# Patient Record
Sex: Male | Born: 1955
Health system: Southern US, Community
[De-identification: ages and names within clinical notes are randomized; demographics above are authoritative.]

## PROBLEM LIST (undated history)

## (undated) DIAGNOSIS — M25569 Pain in unspecified knee: Secondary | ICD-10-CM

## (undated) DIAGNOSIS — F32A Depression, unspecified: Secondary | ICD-10-CM

## (undated) DIAGNOSIS — M1990 Unspecified osteoarthritis, unspecified site: Secondary | ICD-10-CM

## (undated) DIAGNOSIS — I10 Essential (primary) hypertension: Secondary | ICD-10-CM

## (undated) DIAGNOSIS — E785 Hyperlipidemia, unspecified: Secondary | ICD-10-CM

## (undated) DIAGNOSIS — E669 Obesity, unspecified: Secondary | ICD-10-CM

## (undated) DIAGNOSIS — E119 Type 2 diabetes mellitus without complications: Secondary | ICD-10-CM

## (undated) DIAGNOSIS — R600 Localized edema: Secondary | ICD-10-CM

## (undated) DIAGNOSIS — F329 Major depressive disorder, single episode, unspecified: Secondary | ICD-10-CM

## (undated) DIAGNOSIS — G8929 Other chronic pain: Secondary | ICD-10-CM

## (undated) HISTORY — DX: Unspecified osteoarthritis, unspecified site: M19.90

## (undated) HISTORY — DX: Type 2 diabetes mellitus without complications: E11.9

## (undated) HISTORY — DX: Depression, unspecified: F32.A

## (undated) HISTORY — DX: Localized edema: R60.0

## (undated) HISTORY — PX: OTHER SURGICAL HISTORY: SHX169

## (undated) HISTORY — DX: Obesity, unspecified: E66.9

## (undated) HISTORY — DX: Hyperlipidemia, unspecified: E78.5

## (undated) HISTORY — DX: Essential (primary) hypertension: I10

## (undated) HISTORY — DX: Other chronic pain: G89.29

## (undated) HISTORY — DX: Pain in unspecified knee: M25.569

## (undated) HISTORY — DX: Major depressive disorder, single episode, unspecified: F32.9

## (undated) SURGERY — COLONOSCOPY
Anesthesia: Moderate Sedation

---

## 2001-08-12 ENCOUNTER — Emergency Department (HOSPITAL_COMMUNITY): Admission: EM | Admit: 2001-08-12 | Discharge: 2001-08-12 | Payer: Self-pay | Admitting: *Deleted

## 2001-08-12 ENCOUNTER — Encounter: Payer: Self-pay | Admitting: *Deleted

## 2004-05-25 ENCOUNTER — Emergency Department (HOSPITAL_COMMUNITY): Admission: EM | Admit: 2004-05-25 | Discharge: 2004-05-25 | Payer: Self-pay | Admitting: Emergency Medicine

## 2005-03-28 ENCOUNTER — Emergency Department (HOSPITAL_COMMUNITY): Admission: EM | Admit: 2005-03-28 | Discharge: 2005-03-28 | Payer: Self-pay | Admitting: Emergency Medicine

## 2007-03-20 ENCOUNTER — Encounter (INDEPENDENT_AMBULATORY_CARE_PROVIDER_SITE_OTHER): Payer: Self-pay | Admitting: Family Medicine

## 2009-01-25 ENCOUNTER — Emergency Department (HOSPITAL_COMMUNITY): Admission: EM | Admit: 2009-01-25 | Discharge: 2009-01-25 | Payer: Self-pay | Admitting: Emergency Medicine

## 2010-11-11 NOTE — Letter (Signed)
Summary: no show  no show   Imported By: Alden Server 03/22/2007 16:21:51  _____________________________________________________________________  External Attachment:    Type:   Image     Comment:   External Document

## 2011-12-08 ENCOUNTER — Ambulatory Visit (INDEPENDENT_AMBULATORY_CARE_PROVIDER_SITE_OTHER): Payer: Medicare Other | Admitting: Family Medicine

## 2011-12-08 ENCOUNTER — Other Ambulatory Visit: Payer: Self-pay | Admitting: Family Medicine

## 2011-12-08 ENCOUNTER — Encounter: Payer: Self-pay | Admitting: Family Medicine

## 2011-12-08 VITALS — BP 152/70 | HR 96 | Resp 18 | Ht 69.0 in | Wt 265.1 lb

## 2011-12-08 DIAGNOSIS — I10 Essential (primary) hypertension: Secondary | ICD-10-CM

## 2011-12-08 DIAGNOSIS — E785 Hyperlipidemia, unspecified: Secondary | ICD-10-CM

## 2011-12-08 DIAGNOSIS — E669 Obesity, unspecified: Secondary | ICD-10-CM

## 2011-12-08 DIAGNOSIS — R600 Localized edema: Secondary | ICD-10-CM

## 2011-12-08 DIAGNOSIS — M25569 Pain in unspecified knee: Secondary | ICD-10-CM

## 2011-12-08 DIAGNOSIS — R609 Edema, unspecified: Secondary | ICD-10-CM

## 2011-12-08 DIAGNOSIS — M25561 Pain in right knee: Secondary | ICD-10-CM

## 2011-12-08 MED ORDER — FUROSEMIDE 20 MG PO TABS: 20.0000 mg | ORAL_TABLET | Freq: Every day | ORAL | Status: DC

## 2011-12-08 MED ORDER — TRAMADOL HCL 50 MG PO TABS: 50.0000 mg | ORAL_TABLET | Freq: Three times a day (TID) | ORAL | Status: DC | PRN

## 2011-12-08 NOTE — Patient Instructions (Signed)
Start the water pill Lasix for your blood pressure and your leg swelling Get your labs done, do not eat after midnight - we will discuss the labs at your next visit Get the x-rays of your knees Use the pain pill three times a day as needed F/U 2 weeks

## 2011-12-10 LAB — COMPREHENSIVE METABOLIC PANEL
ALT: 53 U/L (ref 0–53)
AST: 49 U/L — ABNORMAL HIGH (ref 0–37)
Albumin: 4 g/dL (ref 3.5–5.2)
Alkaline Phosphatase: 70 U/L (ref 39–117)
BUN: 13 mg/dL (ref 6–23)
CO2: 21 mEq/L (ref 19–32)
Calcium: 8.7 mg/dL (ref 8.4–10.5)
Chloride: 103 mEq/L (ref 96–112)
Creat: 0.87 mg/dL (ref 0.50–1.35)
Glucose, Bld: 127 mg/dL — ABNORMAL HIGH (ref 70–99)
Potassium: 4.3 mEq/L (ref 3.5–5.3)
Sodium: 138 mEq/L (ref 135–145)
Total Bilirubin: 0.4 mg/dL (ref 0.3–1.2)
Total Protein: 7 g/dL (ref 6.0–8.3)

## 2011-12-10 LAB — LIPID PANEL
Cholesterol: 189 mg/dL (ref 0–200)
HDL: 37 mg/dL — ABNORMAL LOW (ref >39)
LDL Cholesterol: 130 mg/dL — ABNORMAL HIGH (ref 0–99)
Total CHOL/HDL Ratio: 5.1 Ratio
Triglycerides: 110 mg/dL (ref <150)
VLDL: 22 mg/dL (ref 0–40)

## 2011-12-10 LAB — CBC
HCT: 51 % (ref 39.0–52.0)
Hemoglobin: 17.1 g/dL — ABNORMAL HIGH (ref 13.0–17.0)
MCH: 35.3 pg — ABNORMAL HIGH (ref 26.0–34.0)
MCHC: 33.5 g/dL (ref 30.0–36.0)
MCV: 105.2 fL — ABNORMAL HIGH (ref 78.0–100.0)
Platelets: 203 10*3/uL (ref 150–400)
RBC: 4.85 MIL/uL (ref 4.22–5.81)
RDW: 12.3 % (ref 11.5–15.5)
WBC: 10.5 10*3/uL (ref 4.0–10.5)

## 2011-12-10 LAB — TSH: TSH: 2.137 u[IU]/mL (ref 0.350–4.500)

## 2011-12-11 ENCOUNTER — Encounter: Payer: Self-pay | Admitting: Family Medicine

## 2011-12-11 DIAGNOSIS — E669 Obesity, unspecified: Secondary | ICD-10-CM | POA: Insufficient documentation

## 2011-12-11 DIAGNOSIS — M25561 Pain in right knee: Secondary | ICD-10-CM | POA: Insufficient documentation

## 2011-12-11 DIAGNOSIS — R600 Localized edema: Secondary | ICD-10-CM | POA: Insufficient documentation
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
## 2011-12-11 NOTE — Assessment & Plan Note (Signed)
He does have small soft tissue prominence of right knee. Obtain plain films, start ultram prn pain.

## 2011-12-11 NOTE — Assessment & Plan Note (Signed)
With his edema and elevated BP, will give trial of lasix, obtain labs

## 2011-12-11 NOTE — Assessment & Plan Note (Signed)
·   Obtain FLP

## 2011-12-11 NOTE — Assessment & Plan Note (Signed)
Lasix trial for symptom relief

## 2011-12-12 ENCOUNTER — Ambulatory Visit (HOSPITAL_COMMUNITY)
Admission: RE | Admit: 2011-12-12 | Discharge: 2011-12-12 | Disposition: A | Discharge status: Home / Self Care | Payer: PRIVATE HEALTH INSURANCE | Source: Ambulatory Visit | Attending: Family Medicine | Admitting: Family Medicine

## 2011-12-12 ENCOUNTER — Other Ambulatory Visit: Payer: Self-pay | Admitting: Family Medicine

## 2011-12-12 DIAGNOSIS — M25561 Pain in right knee: Secondary | ICD-10-CM

## 2011-12-12 DIAGNOSIS — M25562 Pain in left knee: Secondary | ICD-10-CM

## 2011-12-12 DIAGNOSIS — M25569 Pain in unspecified knee: Secondary | ICD-10-CM | POA: Insufficient documentation

## 2011-12-13 LAB — HEMOGLOBIN A1C
Hgb A1c MFr Bld: 6.5 % — ABNORMAL HIGH (ref <5.7)
Mean Plasma Glucose: 140 mg/dL — ABNORMAL HIGH (ref <117)

## 2011-12-22 ENCOUNTER — Encounter: Payer: Self-pay | Admitting: Family Medicine

## 2011-12-22 ENCOUNTER — Ambulatory Visit (INDEPENDENT_AMBULATORY_CARE_PROVIDER_SITE_OTHER): Payer: Medicare Other | Admitting: Family Medicine

## 2011-12-22 DIAGNOSIS — M25561 Pain in right knee: Secondary | ICD-10-CM

## 2011-12-22 DIAGNOSIS — G589 Mononeuropathy, unspecified: Secondary | ICD-10-CM

## 2011-12-22 DIAGNOSIS — L608 Other nail disorders: Secondary | ICD-10-CM

## 2011-12-22 DIAGNOSIS — I10 Essential (primary) hypertension: Secondary | ICD-10-CM

## 2011-12-22 DIAGNOSIS — K529 Noninfective gastroenteritis and colitis, unspecified: Secondary | ICD-10-CM

## 2011-12-22 DIAGNOSIS — E785 Hyperlipidemia, unspecified: Secondary | ICD-10-CM

## 2011-12-22 DIAGNOSIS — M25569 Pain in unspecified knee: Secondary | ICD-10-CM

## 2011-12-22 DIAGNOSIS — M79606 Pain in leg, unspecified: Secondary | ICD-10-CM

## 2011-12-22 DIAGNOSIS — E119 Type 2 diabetes mellitus without complications: Secondary | ICD-10-CM

## 2011-12-22 DIAGNOSIS — G629 Polyneuropathy, unspecified: Secondary | ICD-10-CM

## 2011-12-22 DIAGNOSIS — M79609 Pain in unspecified limb: Secondary | ICD-10-CM

## 2011-12-22 DIAGNOSIS — M25562 Pain in left knee: Secondary | ICD-10-CM

## 2011-12-22 DIAGNOSIS — L602 Onychogryphosis: Secondary | ICD-10-CM

## 2011-12-22 DIAGNOSIS — K5289 Other specified noninfective gastroenteritis and colitis: Secondary | ICD-10-CM

## 2011-12-22 MED ORDER — LISINOPRIL 10 MG PO TABS: 10.0000 mg | ORAL_TABLET | Freq: Every day | ORAL | Status: DC

## 2011-12-22 MED ORDER — TRAMADOL HCL 50 MG PO TABS: ORAL_TABLET | ORAL | Status: DC

## 2011-12-22 MED ORDER — METFORMIN HCL ER 500 MG PO TB24: 500.0000 mg | ORAL_TABLET | Freq: Every day | ORAL | Status: DC

## 2011-12-22 NOTE — Progress Notes (Signed)
  Subjective:    Patient ID: CARVER MURAKAMI, male    DOB: June 25, 1956, 56 y.o.   MRN: [id number]  HPI Patient here to followup last visit with labs and imaging. Knee pain-persistent knee pain with locking and giving out. His x-rays showed tricompartmental disease. He is using Ultram which does help.  Diabetes mellitus-A1c 6.5%. Hyperlipidemia-LDL 130  Stomach bug-patient states he's had as noted above for the past week. With nausea and vomiting depending on what foods he eats such as boiled aches. He is able to tolerate liquids broth soups and bland foods. He also admits to diarrhea which is nonbloody. His brother is also sick with the same thing.  Leg swelling- legs have improved with use of lasix,  Foot pain-he has pain in his feet with walking, unable to walk long distances, also has numbness in his feet  Review of Systems   GEN- denies fatigue, fever, weight loss,weakness, recent illness HEENT- denies eye drainage, change in vision, nasal discharge, CVS- denies chest pain, palpitations RESP- denies SOB, cough, wheeze ABD- + N/V,+ change in stools, abd pain GU- denies dysuria, hematuria, dribbling, incontinence MSK- + joint pain, muscle aches, injury Neuro- denies headache, dizziness, syncope, seizure activity      Objective:   Physical Exam  GEN- NAD, alert and oriented x3, obese,walks with cane HEENT- PERRL, EOMI, non injected sclera, pink conjunctiva, slightly dry MM oropharynx clear CVS- RRR, no murmur RESP-CTAB abd- NABS, soft, NT, ND, obese, EXT- Trace edema bilaterallly lower ext Pulses- Radial, DP- 2+       Assessment & Plan:

## 2011-12-22 NOTE — Patient Instructions (Addendum)
I send to the foot the doctor for your nails I will refer you to orthopedic - Dr. Romeo Apple  I will also have your set up to have the blood flow in your legs looked at  You have diabetes- start the new Medication on Monday once a day  Start the new pill for your kidneys- Dont start until Monday You have a stomach virus virus, drink plenty of fluids  I will set you up to see the social worker  F/U 3 months

## 2011-12-23 DIAGNOSIS — E1140 Type 2 diabetes mellitus with diabetic neuropathy, unspecified: Secondary | ICD-10-CM | POA: Insufficient documentation

## 2011-12-23 DIAGNOSIS — E1149 Type 2 diabetes mellitus with other diabetic neurological complication: Secondary | ICD-10-CM | POA: Insufficient documentation

## 2011-12-23 DIAGNOSIS — M79606 Pain in leg, unspecified: Secondary | ICD-10-CM | POA: Insufficient documentation

## 2011-12-23 DIAGNOSIS — K529 Noninfective gastroenteritis and colitis, unspecified: Secondary | ICD-10-CM | POA: Insufficient documentation

## 2011-12-23 NOTE — Assessment & Plan Note (Signed)
He has components of neuropathy but also concern for peripheral vascular disease

## 2011-12-23 NOTE — Assessment & Plan Note (Signed)
Blood pressure improved, will add lisinopril

## 2011-12-23 NOTE — Assessment & Plan Note (Signed)
Resolving, pt to start meds next week to avoid any ARI

## 2011-12-23 NOTE — Assessment & Plan Note (Signed)
New diagnosis will start metformin, he does not need to test CBG at this time.  I will have the social worker with [facility] [employer] [facility] visit him, he has difficulty with transportation, reading, finances

## 2011-12-23 NOTE — Assessment & Plan Note (Signed)
Pt to work on diet, no meds at this time

## 2011-12-23 NOTE — Assessment & Plan Note (Signed)
Tri-compartmental disease, will refer to ortho for treatment, continue ultram

## 2011-12-23 NOTE — Assessment & Plan Note (Signed)
Will start work up for PVD

## 2012-01-18 ENCOUNTER — Ambulatory Visit (INDEPENDENT_AMBULATORY_CARE_PROVIDER_SITE_OTHER): Payer: PRIVATE HEALTH INSURANCE | Admitting: Orthopedic Surgery

## 2012-01-18 ENCOUNTER — Encounter: Payer: Self-pay | Admitting: Orthopedic Surgery

## 2012-01-18 VITALS — BP 130/70 | Ht 69.0 in | Wt 258.0 lb

## 2012-01-18 DIAGNOSIS — M1710 Unilateral primary osteoarthritis, unspecified knee: Secondary | ICD-10-CM | POA: Insufficient documentation

## 2012-01-18 DIAGNOSIS — M179 Osteoarthritis of knee, unspecified: Secondary | ICD-10-CM

## 2012-01-18 DIAGNOSIS — IMO0002 Reserved for concepts with insufficient information to code with codable children: Secondary | ICD-10-CM
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
## 2012-01-18 NOTE — Patient Instructions (Signed)
You have received a steroid shot. 15% of patients experience increased pain at the injection site with in the next 24 hours. This is best treated with ice and tylenol extra strength 2 tabs every 8 hours. If you are still having pain please call the office.    

## 2012-03-27 ENCOUNTER — Encounter: Payer: Self-pay | Admitting: Family Medicine

## 2012-03-27 ENCOUNTER — Ambulatory Visit (INDEPENDENT_AMBULATORY_CARE_PROVIDER_SITE_OTHER): Payer: PRIVATE HEALTH INSURANCE | Admitting: Family Medicine

## 2012-03-27 VITALS — BP 136/80 | HR 89 | Resp 18 | Ht 69.0 in | Wt 261.1 lb

## 2012-03-27 DIAGNOSIS — M79606 Pain in leg, unspecified: Secondary | ICD-10-CM

## 2012-03-27 DIAGNOSIS — R600 Localized edema: Secondary | ICD-10-CM

## 2012-03-27 DIAGNOSIS — E785 Hyperlipidemia, unspecified: Secondary | ICD-10-CM

## 2012-03-27 DIAGNOSIS — E119 Type 2 diabetes mellitus without complications: Secondary | ICD-10-CM

## 2012-03-27 DIAGNOSIS — M25569 Pain in unspecified knee: Secondary | ICD-10-CM

## 2012-03-27 DIAGNOSIS — R1310 Dysphagia, unspecified: Secondary | ICD-10-CM

## 2012-03-27 DIAGNOSIS — M79609 Pain in unspecified limb: Secondary | ICD-10-CM

## 2012-03-27 DIAGNOSIS — Z1211 Encounter for screening for malignant neoplasm of colon: Secondary | ICD-10-CM

## 2012-03-27 DIAGNOSIS — R1319 Other dysphagia: Secondary | ICD-10-CM | POA: Insufficient documentation

## 2012-03-27 DIAGNOSIS — R059 Cough, unspecified: Secondary | ICD-10-CM | POA: Insufficient documentation

## 2012-03-27 DIAGNOSIS — E669 Obesity, unspecified: Secondary | ICD-10-CM

## 2012-03-27 DIAGNOSIS — R05 Cough: Secondary | ICD-10-CM | POA: Insufficient documentation

## 2012-03-27 DIAGNOSIS — M25561 Pain in right knee: Secondary | ICD-10-CM

## 2012-03-27 DIAGNOSIS — I10 Essential (primary) hypertension: Secondary | ICD-10-CM

## 2012-03-27 DIAGNOSIS — R609 Edema, unspecified: Secondary | ICD-10-CM

## 2012-03-27 MED ORDER — FUROSEMIDE 40 MG PO TABS: 40.0000 mg | ORAL_TABLET | Freq: Every day | ORAL | Status: DC

## 2012-03-27 MED ORDER — SIMVASTATIN 10 MG PO TABS: 10.0000 mg | ORAL_TABLET | Freq: Every evening | ORAL | Status: DC

## 2012-03-27 MED ORDER — LORAZEPAM 1 MG PO TABS: 1.0000 mg | ORAL_TABLET | Freq: Every evening | ORAL | Status: DC | PRN

## 2012-03-27 MED ORDER — LISINOPRIL 20 MG PO TABS: 20.0000 mg | ORAL_TABLET | Freq: Every day | ORAL | Status: DC

## 2012-03-27 NOTE — Patient Instructions (Addendum)
Continue the diabetes pill  I will send a referral to social worker Blood pressure medicine to be increased Take 2 of the water pills- new dose sent  Nerve medicine at bedtime for the shaking and to help you sleep Try allergy medicine  Referral to GI for swallowing and food getting hung, colonoscopy F/U 3 months

## 2012-03-27 NOTE — Assessment & Plan Note (Signed)
Start zocor at bedtime 

## 2012-03-27 NOTE — Assessment & Plan Note (Signed)
Unchanged,discsused diet, avoiding junk food

## 2012-03-27 NOTE — Assessment & Plan Note (Signed)
Refer to GI for evaluation, and colon cancer screen

## 2012-03-27 NOTE — Assessment & Plan Note (Signed)
Unchanged, he did not have vascular studies done, but pulses feel normal today, ? This may all be related to his swelling, will increase lasix first before setting this up again

## 2012-03-27 NOTE — Assessment & Plan Note (Signed)
Uncontrolled, increase lisinopril to 20mg  , labs today

## 2012-03-27 NOTE — Assessment & Plan Note (Signed)
He has a cough associated with sneezing, runny nose, differential allergies, possible related to lisinopril vs GERD Will treat allergie first, GI referral, if no improvement will stop ACEI

## 2012-03-27 NOTE — Assessment & Plan Note (Signed)
Increase lasix to 40mg  daily, elevate feet, I do not think he can afford compression hose

## 2012-03-27 NOTE — Assessment & Plan Note (Signed)
OA both knees, non surgical, continue ultram

## 2012-03-27 NOTE — Progress Notes (Signed)
  Subjective:    Patient ID: Kevin [name], male    DOB: June 01, 1956, 56 y.o.   MRN: [id number]  HPI Patient here to follow chronic medical problems. Medications reviewed. He is tolerating his medications. He does not urinate a lot with Lasix. His legs often still swollen. He was seen by orthopedic surgery for his right knee aspiration was attempted in steroid injection given. He continues to have swelling and pain in the knee per the note he is a nonsurgical case. He uses ultram as needed one to 2 tablets and this helps some. Sunday night he noticed the bruising on his left lower leg. No specific injury. Past few months he's had it cough associated with sneezing and runny nose. He feels like something is caught in his throat. When he eats he feels like his foods are getting along in his throat.   Review of Systems   GEN- denies fatigue, fever, weight loss,weakness, recent illness HEENT- denies eye drainage, change in vision, +nasal discharge, CVS- denies chest pain, palpitations RESP- denies SOB, +cough, wheeze ABD- denies N/V, change in stools, abd pain GU- denies dysuria, hematuria, dribbling, incontinence MSK- + joint pain, +muscle aches, injury Neuro- denies headache, dizziness, syncope, seizure activity     Objective:   Physical Exam GEN- NAD, alert and oriented x3, obese,walks with cane HEENT- PERRL, EOMI, non injected sclera, pink conjunctiva, MMM, oropharynx clear Neck- Supple, no bruit CVS- RRR, no murmur RESP-CTAB abd- NABS, soft, NT, ND, obese, EXT- 1+edema bilaterallly lower ext Pulses- Radial, DP- 2+ Right Knee- decreased ROM small  effusion, mildy prominent soft tissue on right knee-lateral side of patella. , +crepitus,        Assessment & Plan:  [facility] referral placed, difficulty with meds, transportation, illiterate

## 2012-03-27 NOTE — Assessment & Plan Note (Signed)
Check A1C, continue glucaphage

## 2012-03-28 LAB — BASIC METABOLIC PANEL
BUN: 11 mg/dL (ref 6–23)
CO2: 24 mEq/L (ref 19–32)
Calcium: 9.1 mg/dL (ref 8.4–10.5)
Chloride: 103 mEq/L (ref 96–112)
Creat: 0.94 mg/dL (ref 0.50–1.35)
Glucose, Bld: 147 mg/dL — ABNORMAL HIGH (ref 70–99)
Potassium: 4.3 mEq/L (ref 3.5–5.3)
Sodium: 139 mEq/L (ref 135–145)

## 2012-03-28 LAB — HEMOGLOBIN A1C
Hgb A1c MFr Bld: 6.2 % — ABNORMAL HIGH (ref <5.7)
Mean Plasma Glucose: 131 mg/dL — ABNORMAL HIGH (ref <117)

## 2012-04-02 ENCOUNTER — Encounter: Payer: Self-pay | Admitting: Gastroenterology

## 2012-04-17 ENCOUNTER — Ambulatory Visit: Payer: PRIVATE HEALTH INSURANCE | Admitting: Gastroenterology

## 2012-04-19 ENCOUNTER — Ambulatory Visit (INDEPENDENT_AMBULATORY_CARE_PROVIDER_SITE_OTHER): Payer: 59 | Admitting: Gastroenterology

## 2012-04-19 ENCOUNTER — Encounter: Payer: Self-pay | Admitting: Gastroenterology

## 2012-04-19 ENCOUNTER — Other Ambulatory Visit: Payer: Self-pay | Admitting: Internal Medicine

## 2012-04-19 VITALS — BP 131/76 | HR 88 | Temp 97.6°F | Ht 70.0 in | Wt 261.8 lb

## 2012-04-19 DIAGNOSIS — D7589 Other specified diseases of blood and blood-forming organs: Secondary | ICD-10-CM

## 2012-04-19 DIAGNOSIS — R1314 Dysphagia, pharyngoesophageal phase: Secondary | ICD-10-CM

## 2012-04-19 DIAGNOSIS — R718 Other abnormality of red blood cells: Secondary | ICD-10-CM

## 2012-04-19 DIAGNOSIS — Z1211 Encounter for screening for malignant neoplasm of colon: Secondary | ICD-10-CM | POA: Insufficient documentation

## 2012-04-19 DIAGNOSIS — R1310 Dysphagia, unspecified: Secondary | ICD-10-CM

## 2012-04-19 DIAGNOSIS — R1319 Other dysphagia: Secondary | ICD-10-CM

## 2012-04-19 MED ORDER — PEG 3350-KCL-NA BICARB-NACL 420 G PO SOLR: ORAL | Status: AC

## 2012-04-19 NOTE — Assessment & Plan Note (Signed)
Esophageal dysphagia. Recommend EGD/ED.  I have discussed the risks, alternatives, benefits with regards to but not limited to the risk of reaction to medication, bleeding, infection, perforation and the patient is agreeable to proceed. Written consent to be obtained.

## 2012-04-19 NOTE — Patient Instructions (Signed)
Please have your blood work done for B12 and folate. We have scheduled you for an upper endoscopy with dilation and colonoscopy with Dr. Jena Gauss. The day of your bowel prep, you should take only 1/2 of your metformin pill in the morning. See separate instructions.

## 2012-04-19 NOTE — Progress Notes (Signed)
Primary Care Physician:  Milinda Antis, MD  Primary Gastroenterologist:  Roetta Sessions, MD   Chief Complaint  Patient presents with  . Dysphagia    HPI:  Kevin [name] is a 56 y.o. male here at request of Dr. Jeanice Lim for screening colonoscopy and evaluation of esophageal dysphagia. He report no constipation, diarrhea. No melena, brbpr. Sometimes when eat, food wants to come back up. Food doesn't seem to go down well. Mother had to have esophagus stretched. Heartburn sometimes. Takes Rolaids for it. Occasional abdominal pain and nausea. No NSAIDS. Baby ASA per day. Brother died from perforated stomach ulcer related to ASA powders. Patient has never had EGD/TCS. Recent labs as below showed elevated MCV. Patient's POA is his sister, [name] [phone].    Current Outpatient Prescriptions  Medication Sig Dispense Refill  . furosemide (LASIX) 40 MG tablet Take 1 tablet (40 mg total) by mouth daily.  30 tablet  3  . lisinopril (PRINIVIL,ZESTRIL) 20 MG tablet Take 1 tablet (20 mg total) by mouth daily.  30 tablet  2  . LORazepam (ATIVAN) 1 MG tablet Take 1 tablet (1 mg total) by mouth at bedtime as needed for anxiety. For nerves and sleep  30 tablet  1  . metFORMIN (GLUCOPHAGE XR) 500 MG 24 hr tablet Take 1 tablet (500 mg total) by mouth daily with breakfast.  30 tablet  1  . simvastatin (ZOCOR) 10 MG tablet Take 1 tablet (10 mg total) by mouth every evening.  30 tablet  3  . traMADol (ULTRAM) 50 MG tablet Take 1-2 tablets by mouth three times a day as needed  90 tablet  1    Allergies as of 04/19/2012  . (No Known Allergies)    Past Medical History  Diagnosis Date  . Hyperlipidemia   . Depression   . Chronic knee pain   . Diabetes mellitus   . Obesity   . Leg edema     Past Surgical History  Procedure Date  . None     Family History  Problem Relation Age of Onset  . Heart disease    . Arthritis    . Diabetes    . Cancer Mother     deceased    History   Social  History  . Marital Status: Married    Spouse Name: N/A    Number of Children: 0  . Years of Education: N/A   Occupational History  . Not on file.   Social History Main Topics  . Smoking status: Current Everyday Smoker -- 0.2 packs/day  . Smokeless tobacco: Not on file  . Alcohol Use: No  . Drug Use: No  . Sexually Active: Not on file   Other Topics Concern  . Not on file   Social History Narrative  . No narrative on file      ROS:  General: Negative for anorexia, weight loss, fever, chills, fatigue, weakness. Eyes: Negative for vision changes.  ENT: Negative for hoarseness, difficulty swallowing , nasal congestion. CV: Negative for chest pain, angina, palpitations, dyspnea on exertion, peripheral edema.  Respiratory: Negative for dyspnea at rest, dyspnea on exertion,  sputum, wheezing. C/o cough. Saw Dr. Jeanice Lim for it. GI: See history of present illness. GU:  Negative for dysuria, hematuria, urinary incontinence, urinary frequency, nocturnal urination.  MS: chronic knee pain, low back pain.  Derm: Negative for rash or itching.  Neuro: Negative for weakness, abnormal sensation, seizure, frequent headaches, memory loss, confusion.  Psych: Negative for anxiety, depression,  suicidal ideation, hallucinations.  Endo: Negative for unusual weight change.  Heme: Negative for bruising or bleeding. Allergy: Negative for rash or hives.    Physical Examination:  BP 131/76  Pulse 88  Temp 97.6 F (36.4 C) (Temporal)  Ht 5\' 10"  (1.778 m)  Wt 261 lb 12.8 oz (118.752 kg)  BMI 37.56 kg/m2   General: Well-nourished, well-developed in no acute distress. Accompanied by brother.  Head: Normocephalic, atraumatic.   Eyes: Conjunctiva pink, no icterus. Mouth: Oropharyngeal mucosa moist and pink , no lesions erythema or exudate. Neck: Supple without thyromegaly, masses, or lymphadenopathy.  Lungs: Clear to auscultation bilaterally.  Heart: Regular rate and rhythm, no murmurs rubs or  gallops.  Abdomen: Bowel sounds are normal,epig tenderness, nondistended, no hepatosplenomegaly or masses, no abdominal bruits or    hernia , no rebound or guarding.   Rectal: defer Extremities:1+PE lower extremity bilaterally. No clubbing or deformities.  Neuro: Alert and oriented x 4 , grossly normal neurologically.  Skin: Warm and dry, no rash or jaundice.   Psych: Alert and cooperative, normal mood and affect.  Labs: Lab Results  Component Value Date   WBC 10.5 12/08/2011   HGB 17.1* 12/08/2011   HCT 51.0 12/08/2011   MCV 105.2* 12/08/2011   PLT 203 12/08/2011   Lab Results  Component Value Date   CREATININE 0.94 03/27/2012   BUN 11 03/27/2012   NA 139 03/27/2012   K 4.3 03/27/2012   CL 103 03/27/2012   CO2 24 03/27/2012   Lab Results  Component Value Date   ALT 53 12/08/2011   AST 49* 12/08/2011   ALKPHOS 70 12/08/2011   BILITOT 0.4 12/08/2011   Lab Results  Component Value Date   HGBA1C 6.2* 03/27/2012   Lab Results  Component Value Date   TSH 2.137 12/08/2011     Imaging Studies: No results found.

## 2012-04-19 NOTE — Progress Notes (Signed)
Faxed to PCP

## 2012-04-19 NOTE — Assessment & Plan Note (Signed)
Due for first ever colonoscopy.  I have discussed the risks, alternatives, benefits with regards to but not limited to the risk of reaction to medication, bleeding, infection, perforation and the patient is agreeable to proceed. Written consent to be obtained.

## 2012-05-18 ENCOUNTER — Encounter (HOSPITAL_COMMUNITY): Admission: RE | Payer: Self-pay | Source: Ambulatory Visit

## 2012-05-18 ENCOUNTER — Ambulatory Visit (HOSPITAL_COMMUNITY): Admission: RE | Admit: 2012-05-18 | Payer: 59 | Source: Ambulatory Visit | Admitting: Internal Medicine

## 2012-05-18 SURGERY — COLONOSCOPY WITH ESOPHAGOGASTRODUODENOSCOPY (EGD)
Anesthesia: Moderate Sedation

## 2012-06-26 ENCOUNTER — Ambulatory Visit (HOSPITAL_COMMUNITY)
Admission: RE | Admit: 2012-06-26 | Discharge: 2012-06-26 | Disposition: A | Discharge status: Home / Self Care | Payer: PRIVATE HEALTH INSURANCE | Source: Ambulatory Visit | Attending: Family Medicine | Admitting: Family Medicine

## 2012-06-26 ENCOUNTER — Encounter: Payer: Self-pay | Admitting: Family Medicine

## 2012-06-26 ENCOUNTER — Ambulatory Visit (INDEPENDENT_AMBULATORY_CARE_PROVIDER_SITE_OTHER): Payer: PRIVATE HEALTH INSURANCE | Admitting: Family Medicine

## 2012-06-26 VITALS — BP 120/80 | HR 85 | Resp 16 | Ht 69.0 in | Wt 261.8 lb

## 2012-06-26 DIAGNOSIS — E669 Obesity, unspecified: Secondary | ICD-10-CM

## 2012-06-26 DIAGNOSIS — M179 Osteoarthritis of knee, unspecified: Secondary | ICD-10-CM

## 2012-06-26 DIAGNOSIS — I10 Essential (primary) hypertension: Secondary | ICD-10-CM

## 2012-06-26 DIAGNOSIS — F411 Generalized anxiety disorder: Secondary | ICD-10-CM

## 2012-06-26 DIAGNOSIS — R05 Cough: Secondary | ICD-10-CM | POA: Insufficient documentation

## 2012-06-26 DIAGNOSIS — E119 Type 2 diabetes mellitus without complications: Secondary | ICD-10-CM

## 2012-06-26 DIAGNOSIS — F17200 Nicotine dependence, unspecified, uncomplicated: Secondary | ICD-10-CM | POA: Insufficient documentation

## 2012-06-26 DIAGNOSIS — F419 Anxiety disorder, unspecified: Secondary | ICD-10-CM

## 2012-06-26 DIAGNOSIS — R059 Cough, unspecified: Secondary | ICD-10-CM

## 2012-06-26 DIAGNOSIS — E785 Hyperlipidemia, unspecified: Secondary | ICD-10-CM

## 2012-06-26 DIAGNOSIS — M1710 Unilateral primary osteoarthritis, unspecified knee: Secondary | ICD-10-CM

## 2012-06-26 DIAGNOSIS — Z23 Encounter for immunization: Secondary | ICD-10-CM

## 2012-06-26 DIAGNOSIS — IMO0002 Reserved for concepts with insufficient information to code with codable children: Secondary | ICD-10-CM

## 2012-06-26 LAB — CBC
HCT: 48.8 % (ref 39.0–52.0)
Hemoglobin: 17.7 g/dL — ABNORMAL HIGH (ref 13.0–17.0)
MCH: 35.3 pg — ABNORMAL HIGH (ref 26.0–34.0)
MCHC: 36.3 g/dL — ABNORMAL HIGH (ref 30.0–36.0)
MCV: 97.4 fL (ref 78.0–100.0)
Platelets: 204 10*3/uL (ref 150–400)
RBC: 5.01 MIL/uL (ref 4.22–5.81)
RDW: 11.9 % (ref 11.5–15.5)
WBC: 9.1 10*3/uL (ref 4.0–10.5)

## 2012-06-26 LAB — COMPREHENSIVE METABOLIC PANEL
ALT: 53 U/L (ref 0–53)
AST: 54 U/L — ABNORMAL HIGH (ref 0–37)
Albumin: 4.2 g/dL (ref 3.5–5.2)
Alkaline Phosphatase: 73 U/L (ref 39–117)
BUN: 10 mg/dL (ref 6–23)
CO2: 27 mEq/L (ref 19–32)
Calcium: 9 mg/dL (ref 8.4–10.5)
Chloride: 102 mEq/L (ref 96–112)
Creat: 0.85 mg/dL (ref 0.50–1.35)
Glucose, Bld: 152 mg/dL — ABNORMAL HIGH (ref 70–99)
Potassium: 4.4 mEq/L (ref 3.5–5.3)
Sodium: 137 mEq/L (ref 135–145)
Total Bilirubin: 0.4 mg/dL (ref 0.3–1.2)
Total Protein: 7.5 g/dL (ref 6.0–8.3)

## 2012-06-26 LAB — LDL CHOLESTEROL, DIRECT: Direct LDL: 109 mg/dL — ABNORMAL HIGH

## 2012-06-26 MED ORDER — ONETOUCH DELICA LANCETS MISC: Status: DC

## 2012-06-26 MED ORDER — AMLODIPINE BESYLATE 5 MG PO TABS: 5.0000 mg | ORAL_TABLET | Freq: Every day | ORAL | Status: DC

## 2012-06-26 MED ORDER — GLUCOSE BLOOD VI STRP: ORAL_STRIP | Status: DC

## 2012-06-26 MED ORDER — FLUOXETINE HCL 20 MG PO CAPS: 20.0000 mg | ORAL_CAPSULE | Freq: Every day | ORAL | Status: DC

## 2012-06-26 MED ORDER — HYDROCODONE-ACETAMINOPHEN 5-325 MG PO TABS: 1.0000 | ORAL_TABLET | Freq: Two times a day (BID) | ORAL | Status: DC | PRN

## 2012-06-26 NOTE — Progress Notes (Signed)
  Subjective:    Patient ID: Kevin [name], male    DOB: 1956/06/29, 56 y.o.   MRN: [id number]  HPI Patient here to followup chronic medical problems. He's concerned about right knee pain and swelling. He's been seen by orthopedic surgery and is a nonsurgical candidate. He had a cortisone injection however this did not help back in April. Diabetes mellitus he was supposed to get a meter through the mail however this did not occur he is asking for meter today. Cough he continues to have a dry cough that causes typical in his throat. This did not improve with his allergy medication Nerves/anxiety he states that his nerves are bad and that the ativanI gave him to help with his nerves and sleep actually caused a reverse effect   Review of Systems  GEN- denies fatigue, fever, weight loss,weakness, recent illness HEENT- denies eye drainage, change in vision, nasal discharge, CVS- denies chest pain, palpitations RESP- denies SOB, cough, wheeze ABD- denies N/V, change in stools, abd pain GU- denies dysuria, hematuria, dribbling, incontinence MSK- + joint pain, muscle aches, injury Neuro- denies headache, dizziness, syncope, seizure activity      Objective:   Physical Exam  GEN- NAD, alert and oriented x3, obese,walks with cane HEENT- PERRL, EOMI, non injected sclera, pink conjunctiva, slightly dry MM oropharynx clear CVS- RRR, no murmur RESP-CTAB abd- NABS, soft, NT, ND,  EXT- + pitting edema bilaterallly lower ext Knee- small right effusion, liagments in tact, antalgic gait, decreased ROM right knee  Pulses- Radial, DP- 2+ Psych-normal affect and Mood        Assessment & Plan:

## 2012-06-26 NOTE — Patient Instructions (Addendum)
Chest xray to be done Stop the lisinopril Take new blood pressure medication- norvasc once a day  Take the strong pill for your knee as needed, continue ultram Meter given- take your blood twice a week Foot doctor referral Start new nerve pill-prozac F/U 6 weeks

## 2012-06-27 DIAGNOSIS — F419 Anxiety disorder, unspecified: Secondary | ICD-10-CM | POA: Insufficient documentation

## 2012-06-27 LAB — HEMOGLOBIN A1C
Hgb A1c MFr Bld: 6.3 % — ABNORMAL HIGH (ref <5.7)
Mean Plasma Glucose: 134 mg/dL — ABNORMAL HIGH (ref <117)

## 2012-06-27 NOTE — Assessment & Plan Note (Addendum)
Much improved today, however with cough, trial off ACEI, start norvasc, f/u 6 weeks

## 2012-06-27 NOTE — Assessment & Plan Note (Signed)
Non surgical case, start hydrocodone for severe pain, continue ultram

## 2012-06-27 NOTE — Assessment & Plan Note (Signed)
unchanged

## 2012-06-27 NOTE — Assessment & Plan Note (Signed)
Well controlled 

## 2012-06-27 NOTE — Assessment & Plan Note (Signed)
D/C ativan, start prozac

## 2012-06-27 NOTE — Assessment & Plan Note (Signed)
CXR negative for acute infection, trial off ACEI

## 2012-07-03 ENCOUNTER — Other Ambulatory Visit: Payer: Self-pay | Admitting: Family Medicine

## 2012-07-05 ENCOUNTER — Telehealth: Payer: Self-pay | Admitting: Family Medicine

## 2012-07-18 ENCOUNTER — Other Ambulatory Visit: Payer: Self-pay

## 2012-07-20 NOTE — Telephone Encounter (Signed)
Updates to patient's med list made.

## 2012-08-07 ENCOUNTER — Encounter: Payer: Self-pay | Admitting: Family Medicine

## 2012-08-07 ENCOUNTER — Ambulatory Visit (INDEPENDENT_AMBULATORY_CARE_PROVIDER_SITE_OTHER): Payer: PRIVATE HEALTH INSURANCE | Admitting: Family Medicine

## 2012-08-07 VITALS — BP 122/78 | HR 77 | Resp 15 | Ht 69.0 in | Wt 260.0 lb

## 2012-08-07 DIAGNOSIS — R05 Cough: Secondary | ICD-10-CM

## 2012-08-07 DIAGNOSIS — R259 Unspecified abnormal involuntary movements: Secondary | ICD-10-CM

## 2012-08-07 DIAGNOSIS — R059 Cough, unspecified: Secondary | ICD-10-CM

## 2012-08-07 DIAGNOSIS — R7989 Other specified abnormal findings of blood chemistry: Secondary | ICD-10-CM

## 2012-08-07 DIAGNOSIS — M179 Osteoarthritis of knee, unspecified: Secondary | ICD-10-CM

## 2012-08-07 DIAGNOSIS — R251 Tremor, unspecified: Secondary | ICD-10-CM | POA: Insufficient documentation

## 2012-08-07 DIAGNOSIS — IMO0002 Reserved for concepts with insufficient information to code with codable children: Secondary | ICD-10-CM

## 2012-08-07 DIAGNOSIS — F419 Anxiety disorder, unspecified: Secondary | ICD-10-CM

## 2012-08-07 DIAGNOSIS — M1710 Unilateral primary osteoarthritis, unspecified knee: Secondary | ICD-10-CM

## 2012-08-07 DIAGNOSIS — I10 Essential (primary) hypertension: Secondary | ICD-10-CM

## 2012-08-07 DIAGNOSIS — G629 Polyneuropathy, unspecified: Secondary | ICD-10-CM

## 2012-08-07 DIAGNOSIS — F411 Generalized anxiety disorder: Secondary | ICD-10-CM

## 2012-08-07 MED ORDER — DICLOFENAC SODIUM 1 % TD GEL: TRANSDERMAL | Status: DC

## 2012-08-07 NOTE — Assessment & Plan Note (Signed)
Statin on hold.,recheck LFT today

## 2012-08-07 NOTE — Patient Instructions (Addendum)
Continue your current medications New topical medication for your knee Knee brace and ice  Labs to be done Neurology appointment for the tremor F/U 3 months

## 2012-08-07 NOTE — Progress Notes (Signed)
  Subjective:    Patient ID: Kevin [name], male    DOB: 02-13-1956, 56 y.o.   MRN: [id number]  HPI Patient here for interim visit for high blood pressure and anxiety. He's had swelling of his right knee for the past week. He feels a knot on the bottom of the. Hydrocodone has helped with pain however he has worse pain when he is up ambulating. He's asking for a brace to help stabilize his knee. The Prozac medication is helping his nerves he continues to have a bad tremor.   Review of Systems  GEN- denies fatigue, fever, weight loss,weakness, recent illness HEENT- denies eye drainage, change in vision, nasal discharge, CVS- denies chest pain, palpitations RESP- denies SOB, cough, wheeze ABD- denies N/V, change in stools, abd pain GU- denies dysuria, hematuria, dribbling, incontinence MSK-+ joint pain, muscle aches, injury Neuro- denies headache, dizziness, syncope, seizure activity      Objective:   Physical Exam  GEN- NAD, alert and oriented x3, obese,walks with cane CVS- RRR, no murmur RESP-CTAB EXT- trace edema  Knee- small right effusion, liagments in tact, antalgic gait, decreased ROM right knee  Pulses- Radial, DP- 2+ Psych-normal affect and Mood  Neuro- CNII-XII in tact, no focal deficits, tremor with hands outstretched or with purposeful movements of left hand, i.e. Grasp        Assessment & Plan:

## 2012-08-07 NOTE — Assessment & Plan Note (Signed)
Doing well on norvasc

## 2012-08-07 NOTE — Assessment & Plan Note (Addendum)
Currently with acute flare and swelling, he declines ortho , obtain brace for knee, RICE therapy, pain meds, topical NSAID

## 2012-08-07 NOTE — Assessment & Plan Note (Signed)
Refer to neurology, family history of tremor, low dose benzo did not help

## 2012-08-07 NOTE — Assessment & Plan Note (Signed)
prozac has helped continue

## 2012-08-07 NOTE — Assessment & Plan Note (Signed)
Minimal cough since d/c of ACEI

## 2012-08-08 ENCOUNTER — Telehealth: Payer: Self-pay | Admitting: Family Medicine

## 2012-08-08 LAB — HEPATIC FUNCTION PANEL
ALT: 50 U/L (ref 0–53)
AST: 44 U/L — ABNORMAL HIGH (ref 0–37)
Albumin: 3.9 g/dL (ref 3.5–5.2)
Alkaline Phosphatase: 76 U/L (ref 39–117)
Bilirubin, Direct: 0.1 mg/dL (ref 0.0–0.3)
Indirect Bilirubin: 0.3 mg/dL (ref 0.0–0.9)
Total Bilirubin: 0.4 mg/dL (ref 0.3–1.2)
Total Protein: 7.5 g/dL (ref 6.0–8.3)

## 2012-08-08 LAB — VITAMIN B12: Vitamin B-12: 775 pg/mL (ref 211–911)

## 2012-08-08 NOTE — Telephone Encounter (Signed)
Sister will give patient the message

## 2012-08-16 ENCOUNTER — Telehealth: Payer: Self-pay

## 2012-08-16 NOTE — Telephone Encounter (Signed)
Order written again

## 2012-08-16 NOTE — Telephone Encounter (Signed)
Said that the rx you wrote for the knee brace wasn't covered by his insurance. She wants you to fax an order for the brace you wanted him to get and she will try to get it from [facility].

## 2012-08-17 NOTE — Telephone Encounter (Signed)
Faxed to Marja Kays to see if she is able to get for him through [facility]

## 2012-11-06 ENCOUNTER — Ambulatory Visit: Payer: PRIVATE HEALTH INSURANCE | Admitting: Family Medicine

## 2012-11-13 ENCOUNTER — Encounter: Payer: Self-pay | Admitting: Family Medicine

## 2012-11-13 ENCOUNTER — Ambulatory Visit (INDEPENDENT_AMBULATORY_CARE_PROVIDER_SITE_OTHER): Payer: Medicaid Other | Admitting: Family Medicine

## 2012-11-13 VITALS — BP 118/70 | HR 86 | Resp 16 | Ht 69.0 in | Wt 261.0 lb

## 2012-11-13 DIAGNOSIS — M1710 Unilateral primary osteoarthritis, unspecified knee: Secondary | ICD-10-CM

## 2012-11-13 DIAGNOSIS — E119 Type 2 diabetes mellitus without complications: Secondary | ICD-10-CM

## 2012-11-13 DIAGNOSIS — R251 Tremor, unspecified: Secondary | ICD-10-CM

## 2012-11-13 DIAGNOSIS — I10 Essential (primary) hypertension: Secondary | ICD-10-CM

## 2012-11-13 DIAGNOSIS — M179 Osteoarthritis of knee, unspecified: Secondary | ICD-10-CM

## 2012-11-13 DIAGNOSIS — R259 Unspecified abnormal involuntary movements: Secondary | ICD-10-CM

## 2012-11-13 DIAGNOSIS — E785 Hyperlipidemia, unspecified: Secondary | ICD-10-CM

## 2012-11-13 DIAGNOSIS — IMO0002 Reserved for concepts with insufficient information to code with codable children: Secondary | ICD-10-CM

## 2012-11-13 NOTE — Progress Notes (Signed)
  Subjective:    Patient ID: Kevin [name], male    DOB: [birth year], 57 y.o.   MRN: [id number]  HPI   Patient presents to follow chronic medical problems. He continues to have knee pain and knee swelling and will like to see a different orthopedic surgeon he is using his pain medicine as prescribed. He was using a knee sleeve but this has not helped very much.  Diabetes mellitus- because of his tremor has not been able to take his blood sugar has been taken his medication per report.  [facility] and has been following with the patient and he has been using [facility] for transportation he did see the eye doctor in the foot Doctor  Review of Systems - per above  GEN- denies fatigue, fever, weight loss,weakness, recent illness HEENT- denies eye drainage, change in vision, nasal discharge, CVS- denies chest pain, palpitations RESP- denies SOB, cough, wheeze ABD- denies N/V, change in stools, abd pain GU- denies dysuria, hematuria, dribbling, incontinence MSK- + joint pain, muscle aches, injury Neuro- denies headache, dizziness, syncope, seizure activity      Objective:   Physical Exam  GEN- NAD, alert and oriented x3, obese,  HEENT- PERRL EOMI, non icteric, MMM, poor dentition CVS- RRR, no murmur RESP-CTAB EXT- trace edema  Knee- Right knee- no effusion, liagments in tact, antalgic gait, decreased ROM right knee  Pulses- Radial, DP- 2+ Psych-normal affect and Mood  Neuro- CNII-XII in tact, no focal deficits, tremor with hands outstretched or with purposeful movements of left hand,. Grasp equal bilat         Assessment & Plan:    Declines colonoscopy

## 2012-11-13 NOTE — Patient Instructions (Addendum)
Get the labs done We will check on referral for the neurologist  Pain medicine refilled for knee  Referral for your knee to [facility]  Continue to work on the smoking! If we need to change your medications we will call and let you know F/U 3 months

## 2012-11-14 LAB — COMPREHENSIVE METABOLIC PANEL
ALT: 48 U/L (ref 0–53)
AST: 35 U/L (ref 0–37)
Albumin: 4.3 g/dL (ref 3.5–5.2)
Alkaline Phosphatase: 73 U/L (ref 39–117)
BUN: 9 mg/dL (ref 6–23)
CO2: 23 mEq/L (ref 19–32)
Calcium: 9.1 mg/dL (ref 8.4–10.5)
Chloride: 103 mEq/L (ref 96–112)
Creat: 0.78 mg/dL (ref 0.50–1.35)
Glucose, Bld: 110 mg/dL — ABNORMAL HIGH (ref 70–99)
Potassium: 4.2 mEq/L (ref 3.5–5.3)
Sodium: 138 mEq/L (ref 135–145)
Total Bilirubin: 0.5 mg/dL (ref 0.3–1.2)
Total Protein: 7.2 g/dL (ref 6.0–8.3)

## 2012-11-14 LAB — CBC
HCT: 47.5 % (ref 39.0–52.0)
Hemoglobin: 16.9 g/dL (ref 13.0–17.0)
MCH: 34 pg (ref 26.0–34.0)
MCHC: 35.6 g/dL (ref 30.0–36.0)
MCV: 95.6 fL (ref 78.0–100.0)
Platelets: 209 10*3/uL (ref 150–400)
RBC: 4.97 MIL/uL (ref 4.22–5.81)
RDW: 12.5 % (ref 11.5–15.5)
WBC: 9.6 10*3/uL (ref 4.0–10.5)

## 2012-11-14 LAB — HEMOGLOBIN A1C
Hgb A1c MFr Bld: 6.8 % — ABNORMAL HIGH (ref <5.7)
Mean Plasma Glucose: 148 mg/dL — ABNORMAL HIGH (ref <117)

## 2012-11-14 LAB — LDL CHOLESTEROL, DIRECT: Direct LDL: 108 mg/dL — ABNORMAL HIGH

## 2012-11-14 NOTE — Assessment & Plan Note (Signed)
OA of knee, on pain meds, refer to ortho in [place].

## 2012-11-14 NOTE — Assessment & Plan Note (Signed)
Refer to neurology again 

## 2012-11-14 NOTE — Assessment & Plan Note (Signed)
Well controlled 

## 2012-11-14 NOTE — Assessment & Plan Note (Signed)
Recheck A1C, on metformin, does not check CBG on regular basis, I think this is a combination of understanding and his tremor Diabetic shoes needed Seen by podiatry and eye doctor

## 2012-11-15 ENCOUNTER — Telehealth: Payer: Self-pay | Admitting: Family Medicine

## 2012-11-15 NOTE — Telephone Encounter (Signed)
Sister is aware will let patient know

## 2012-11-15 NOTE — Telephone Encounter (Signed)
Sister is aware will let patient know °

## 2012-11-16 ENCOUNTER — Other Ambulatory Visit: Payer: Self-pay

## 2012-11-16 ENCOUNTER — Telehealth: Payer: Self-pay | Admitting: Family Medicine

## 2012-11-16 MED ORDER — HYDROCODONE-ACETAMINOPHEN 5-325 MG PO TABS: 1.0000 | ORAL_TABLET | Freq: Two times a day (BID) | ORAL | Status: DC | PRN

## 2012-11-16 NOTE — Telephone Encounter (Signed)
Patient is aware 

## 2013-01-07 ENCOUNTER — Other Ambulatory Visit: Payer: Self-pay | Admitting: Neurology

## 2013-01-07 DIAGNOSIS — R270 Ataxia, unspecified: Secondary | ICD-10-CM

## 2013-01-09 ENCOUNTER — Other Ambulatory Visit: Payer: Self-pay

## 2013-01-09 DIAGNOSIS — G4730 Sleep apnea, unspecified: Secondary | ICD-10-CM

## 2013-01-10 ENCOUNTER — Other Ambulatory Visit (HOSPITAL_COMMUNITY): Payer: Medicaid Other

## 2013-01-11 ENCOUNTER — Ambulatory Visit (HOSPITAL_COMMUNITY)
Admission: RE | Admit: 2013-01-11 | Discharge: 2013-01-11 | Disposition: A | Discharge status: Home / Self Care | Payer: PRIVATE HEALTH INSURANCE | Source: Ambulatory Visit | Attending: Neurology | Admitting: Neurology

## 2013-01-11 DIAGNOSIS — R270 Ataxia, unspecified: Secondary | ICD-10-CM

## 2013-01-11 DIAGNOSIS — R209 Unspecified disturbances of skin sensation: Secondary | ICD-10-CM | POA: Insufficient documentation

## 2013-01-11 DIAGNOSIS — R259 Unspecified abnormal involuntary movements: Secondary | ICD-10-CM | POA: Insufficient documentation

## 2013-01-11 DIAGNOSIS — R279 Unspecified lack of coordination: Secondary | ICD-10-CM | POA: Insufficient documentation

## 2013-02-12 ENCOUNTER — Ambulatory Visit (INDEPENDENT_AMBULATORY_CARE_PROVIDER_SITE_OTHER): Payer: PRIVATE HEALTH INSURANCE | Admitting: Family Medicine

## 2013-02-12 ENCOUNTER — Encounter: Payer: Self-pay | Admitting: Family Medicine

## 2013-02-12 VITALS — BP 118/70 | HR 90 | Resp 16 | Wt 266.0 lb

## 2013-02-12 DIAGNOSIS — M1710 Unilateral primary osteoarthritis, unspecified knee: Secondary | ICD-10-CM

## 2013-02-12 DIAGNOSIS — IMO0002 Reserved for concepts with insufficient information to code with codable children: Secondary | ICD-10-CM

## 2013-02-12 DIAGNOSIS — F411 Generalized anxiety disorder: Secondary | ICD-10-CM

## 2013-02-12 DIAGNOSIS — E119 Type 2 diabetes mellitus without complications: Secondary | ICD-10-CM

## 2013-02-12 DIAGNOSIS — G629 Polyneuropathy, unspecified: Secondary | ICD-10-CM

## 2013-02-12 DIAGNOSIS — I10 Essential (primary) hypertension: Secondary | ICD-10-CM

## 2013-02-12 DIAGNOSIS — M179 Osteoarthritis of knee, unspecified: Secondary | ICD-10-CM

## 2013-02-12 DIAGNOSIS — G589 Mononeuropathy, unspecified: Secondary | ICD-10-CM

## 2013-02-12 DIAGNOSIS — E669 Obesity, unspecified: Secondary | ICD-10-CM

## 2013-02-12 DIAGNOSIS — F419 Anxiety disorder, unspecified: Secondary | ICD-10-CM

## 2013-02-12 DIAGNOSIS — E785 Hyperlipidemia, unspecified: Secondary | ICD-10-CM

## 2013-02-12 LAB — GLUCOSE, POCT (MANUAL RESULT ENTRY): POC Glucose: 272 mg/dl — AB (ref 70–99)

## 2013-02-12 MED ORDER — FUROSEMIDE 40 MG PO TABS: 40.0000 mg | ORAL_TABLET | Freq: Every day | ORAL | Status: DC

## 2013-02-12 MED ORDER — FLUOXETINE HCL 20 MG PO CAPS: 20.0000 mg | ORAL_CAPSULE | Freq: Every day | ORAL | Status: DC

## 2013-02-12 MED ORDER — METFORMIN HCL 500 MG PO TABS: 500.0000 mg | ORAL_TABLET | Freq: Two times a day (BID) | ORAL | Status: DC

## 2013-02-12 MED ORDER — AMLODIPINE BESYLATE 5 MG PO TABS: 5.0000 mg | ORAL_TABLET | Freq: Every day | ORAL | Status: DC

## 2013-02-12 MED ORDER — SIMVASTATIN 10 MG PO TABS: 10.0000 mg | ORAL_TABLET | Freq: Every evening | ORAL | Status: DC

## 2013-02-12 NOTE — Patient Instructions (Signed)
Take extra water pill tonight Restart metformin twice a day  Get the labs done today  Restart cholesterol pill at bedtime Follow-up with foot doctor in June F/U 3 month - [employer]

## 2013-02-13 ENCOUNTER — Encounter: Payer: Self-pay | Admitting: Family Medicine

## 2013-02-13 LAB — BASIC METABOLIC PANEL
BUN: 9 mg/dL (ref 6–23)
CO2: 25 mEq/L (ref 19–32)
Calcium: 9.2 mg/dL (ref 8.4–10.5)
Chloride: 102 mEq/L (ref 96–112)
Creat: 0.81 mg/dL (ref 0.50–1.35)
Glucose, Bld: 221 mg/dL — ABNORMAL HIGH (ref 70–99)
Potassium: 4.3 mEq/L (ref 3.5–5.3)
Sodium: 138 mEq/L (ref 135–145)

## 2013-02-13 LAB — HEMOGLOBIN A1C
Hgb A1c MFr Bld: 6.5 % — ABNORMAL HIGH (ref <5.7)
Mean Plasma Glucose: 140 mg/dL — ABNORMAL HIGH (ref <117)

## 2013-02-13 NOTE — Assessment & Plan Note (Signed)
Doing well on SSRI

## 2013-02-13 NOTE — Assessment & Plan Note (Signed)
Okay to restart statin drug, sent again

## 2013-02-13 NOTE — Assessment & Plan Note (Signed)
Well controlled 

## 2013-02-13 NOTE — Assessment & Plan Note (Signed)
Continues to gain weight, mobility and issue, though he tells me he ate at all you can eat chinese buffet, doubt he is following any dietary recommendations

## 2013-02-13 NOTE — Progress Notes (Signed)
  Subjective:    Patient ID: Kevin [name], male    DOB: 1956/06/27, 57 y.o.   MRN: [id number]  HPI  Pt here to f/u chronic medical problems. No specific concerns. Does not check CBG due to chronic tremor, being evaluated for possible parkinson's. Seen by foot doctor has appt next month, seen by eye doctor [facility] nurse fixes his box of meds for the month, had multiple bottles per [facility], doubt he was taking them all.  Due for fasting labs   Review of Systems   GEN- denies fatigue, fever, weight loss,weakness, recent illness HEENT- denies eye drainage, change in vision, nasal discharge, CVS- denies chest pain, palpitations RESP- denies SOB, cough, wheeze ABD- denies N/V, change in stools, abd pain GU- denies dysuria, hematuria, dribbling, incontinence MSK- + joint pain, muscle aches, injury Neuro- denies headache, dizziness, syncope, seizure activity      Objective:   Physical Exam    GEN- NAD, alert and oriented x3, obese,  HEENT- PERRL EOMI, non icteric,oropharyn clear, MMM, poor dentition CVS- RRR, no murmur RESP-CTAB EXT- trace edema  Pulses- Radial, DP- 2+ Psych-normal affect and Mood       Assessment & Plan:

## 2013-02-13 NOTE — Assessment & Plan Note (Signed)
States she saw ortho, fluid removed, I will obtain note Continue ultram and vicodin severe pain Non surgical candidate

## 2013-02-13 NOTE — Assessment & Plan Note (Addendum)
He does not have metformin in bag, will refill , fasting labs to be done We will call and check on his diabetic shoes F/u podiatry next month He has seen eye doctor Needs urine micro next visit

## 2013-02-14 ENCOUNTER — Telehealth: Payer: Self-pay | Admitting: Family Medicine

## 2013-02-14 NOTE — Assessment & Plan Note (Signed)
Obtain diabetic shoes, decrease chance formation of ulcer

## 2013-02-15 NOTE — Telephone Encounter (Signed)
Spoke with April at [facility] and updated rx for diabetic shoes.

## 2013-03-21 ENCOUNTER — Other Ambulatory Visit: Payer: Self-pay

## 2013-03-21 DIAGNOSIS — G4730 Sleep apnea, unspecified: Secondary | ICD-10-CM

## 2013-05-15 ENCOUNTER — Ambulatory Visit (INDEPENDENT_AMBULATORY_CARE_PROVIDER_SITE_OTHER): Payer: PRIVATE HEALTH INSURANCE | Admitting: Family Medicine

## 2013-05-15 ENCOUNTER — Encounter: Payer: Self-pay | Admitting: Family Medicine

## 2013-05-15 VITALS — BP 128/76 | HR 88 | Temp 98.8°F | Resp 20 | Wt 268.0 lb

## 2013-05-15 DIAGNOSIS — M1710 Unilateral primary osteoarthritis, unspecified knee: Secondary | ICD-10-CM

## 2013-05-15 DIAGNOSIS — G4730 Sleep apnea, unspecified: Secondary | ICD-10-CM

## 2013-05-15 DIAGNOSIS — M179 Osteoarthritis of knee, unspecified: Secondary | ICD-10-CM

## 2013-05-15 DIAGNOSIS — G629 Polyneuropathy, unspecified: Secondary | ICD-10-CM

## 2013-05-15 DIAGNOSIS — I10 Essential (primary) hypertension: Secondary | ICD-10-CM

## 2013-05-15 DIAGNOSIS — IMO0002 Reserved for concepts with insufficient information to code with codable children: Secondary | ICD-10-CM

## 2013-05-15 DIAGNOSIS — E785 Hyperlipidemia, unspecified: Secondary | ICD-10-CM

## 2013-05-15 DIAGNOSIS — G589 Mononeuropathy, unspecified: Secondary | ICD-10-CM

## 2013-05-15 DIAGNOSIS — E119 Type 2 diabetes mellitus without complications: Secondary | ICD-10-CM

## 2013-05-15 NOTE — Progress Notes (Signed)
  Subjective:    Patient ID: Kevin [name], male    DOB: 11-17-1955, 57 y.o.   MRN: [id number]  HPI  Patient here to followup chronic medical problems. He has multiple concerns. Over the weekend he had worsening right knee pain any acute areas of swelling. He used ice and his pain medications and this helps some but he continues to have difficulty with the knee. No recent falls.  Diabetes mellitus he checks his sugars sometimes but he does not know what they have been running. He has been taking his medication as prescribed. He still has not received his diabetic shoes  Sleep apnea he was seen by neurology he was to have a home sleep study test done but he has not heard anything back about setting this up. He is also currently on medication for possible Parkinson's causing tremor. He also has a brace for his left hand due to carpal tunnel syndrome. This has improved the tremor in his left hand.  Review of Systems  GEN- denies fatigue, fever, weight loss,weakness, recent illness HEENT- denies eye drainage, change in vision, nasal discharge, CVS- denies chest pain, palpitations RESP- denies SOB, cough, wheeze ABD- denies N/V, change in stools, abd pain GU- denies dysuria, hematuria, dribbling, incontinence MSK- + joint pain, muscle aches, injury Neuro- denies headache, dizziness, syncope, seizure activity      Objective:   Physical Exam  GEN- NAD, alert and oriented x3, obese,  HEENT- PERRL EOMI, non icteric, MMM, poor dentition,wearing glasses CVS- RRR, no murmur RESP-CTAB EXT- trace edema , thick toenails Knee- Right knee- no effusion,, antalgic gait, decreased ROM right knee  Pulses- Radial, DP- 2+ Psych-normal affect and Mood         Assessment & Plan:

## 2013-05-15 NOTE — Assessment & Plan Note (Signed)
Concern for obstructive sleep apnea. I reviewed the neurology note he did when set him up with a home sleep study. I will have my nurses contact neurology to have this done

## 2013-05-15 NOTE — Assessment & Plan Note (Signed)
Blood pressure looks good we will continue current occasions he had allergic reaction to ACE inhibitor

## 2013-05-15 NOTE — Assessment & Plan Note (Addendum)
I will give him a followup appointment with [facility] her who saw him previously for his knee

## 2013-05-15 NOTE — Assessment & Plan Note (Signed)
We will plan to have cholesterol rechecked he is currently on Zocor 10 mg

## 2013-05-15 NOTE — Assessment & Plan Note (Signed)
We'll recheck his A1c his diabetes has been well-controlled on metformin

## 2013-05-15 NOTE — Assessment & Plan Note (Signed)
Diabetic neuropathy noted he truly needs diabetic shoes

## 2013-05-15 NOTE — Patient Instructions (Addendum)
I will call to check in to the shoes  We will get you an appointment with  Knee doctor Continue hydrocodone as needed Schedule  With [facility] to take you to the lab, Next Wed 13th at 10am ( Do not EAT anything) We will contact the neurologist and schedule a follow-up appointment  F/U 3 months

## 2013-05-17 ENCOUNTER — Telehealth: Payer: Self-pay | Admitting: Family Medicine

## 2013-05-17 NOTE — Telephone Encounter (Signed)
Message copied by Samuella Cota on Fri May 17, 2013 10:48 AM ------      Message from: Milinda Antis F      Created: Fri May 17, 2013  9:07 AM      Regarding: RE: Multiple things needed for pt- he is illiterate and needs assistance       Call [facility] Marja Kays and give her the instructions for the patient.      ----- Message -----         From: Samuella Cota         Sent: 05/16/2013  11:14 AM           To: Salley Scarlet, MD      Subject: RE: Multiple things needed for pt- he is ill#            Diabetic shoes: [facility] has called couple times to set up appt for measurements, they called sister number and home number and both were unsuccessful, they are tying again today to see if they can make appt.            [facility]: Pt has appt Mon Aug 11 at 10am with Dr. Remigio Eisenmenger.\            Neurology: they are going to look into the sleep study and send [place] something over by fax.            [facility]: Sending over fax with last eye exam            I have tried calling pt to give above and left message to return my call      ----- Message -----         From: Salley Scarlet, MD         Sent: 05/15/2013   5:47 PM           To: Salley Scarlet, MD, Samuella Cota      Subject: Multiple things needed for pt- he is illiter#                  Please call-       1. Cathleen Fears- schedule a f/u appt for his OA of right knee      2. Neurology- Dr. Gerilyn Pilgrim- please let the nurse know he never received the Sleep study that was suppose to be set up at home       [facility] Neurology- [place])      3. Diabetic Shoes- pt still has not received the diabetic shoes, I have sent ordr twice to [employer], see last order on your desk please call and see what the issue is.        4. Please get his eye visit note from [facility] in [place]             ------

## 2013-05-17 NOTE — Telephone Encounter (Signed)
Sister [name] is aware of message and appt at [facility] for Monday

## 2013-05-22 LAB — LIPID PANEL
Cholesterol: 155 mg/dL (ref 0–200)
HDL: 34 mg/dL — ABNORMAL LOW (ref >39)
LDL Cholesterol: 96 mg/dL (ref 0–99)
Total CHOL/HDL Ratio: 4.6 Ratio
Triglycerides: 125 mg/dL (ref <150)
VLDL: 25 mg/dL (ref 0–40)

## 2013-05-22 LAB — CBC
HCT: 45.2 % (ref 39.0–52.0)
Hemoglobin: 16 g/dL (ref 13.0–17.0)
MCH: 34.5 pg — ABNORMAL HIGH (ref 26.0–34.0)
MCHC: 35.4 g/dL (ref 30.0–36.0)
MCV: 97.4 fL (ref 78.0–100.0)
Platelets: 195 10*3/uL (ref 150–400)
RBC: 4.64 MIL/uL (ref 4.22–5.81)
RDW: 12.1 % (ref 11.5–15.5)
WBC: 9.4 10*3/uL (ref 4.0–10.5)

## 2013-05-22 LAB — COMPREHENSIVE METABOLIC PANEL
ALT: 41 U/L (ref 0–53)
AST: 35 U/L (ref 0–37)
Albumin: 3.8 g/dL (ref 3.5–5.2)
Alkaline Phosphatase: 69 U/L (ref 39–117)
BUN: 9 mg/dL (ref 6–23)
CO2: 27 mEq/L (ref 19–32)
Calcium: 8.4 mg/dL (ref 8.4–10.5)
Chloride: 105 mEq/L (ref 96–112)
Creat: 0.84 mg/dL (ref 0.50–1.35)
Glucose, Bld: 128 mg/dL — ABNORMAL HIGH (ref 70–99)
Potassium: 4.1 mEq/L (ref 3.5–5.3)
Sodium: 136 mEq/L (ref 135–145)
Total Bilirubin: 0.5 mg/dL (ref 0.3–1.2)
Total Protein: 6.8 g/dL (ref 6.0–8.3)

## 2013-05-23 LAB — HEMOGLOBIN A1C
Hgb A1c MFr Bld: 6.5 % — ABNORMAL HIGH (ref <5.7)
Mean Plasma Glucose: 140 mg/dL — ABNORMAL HIGH (ref <117)

## 2013-05-27 ENCOUNTER — Encounter: Payer: Self-pay | Admitting: Family Medicine

## 2013-05-27 DIAGNOSIS — Z550 Illiteracy and low-level literacy: Secondary | ICD-10-CM | POA: Insufficient documentation

## 2013-06-19 ENCOUNTER — Other Ambulatory Visit (HOSPITAL_COMMUNITY): Payer: Self-pay

## 2013-06-19 DIAGNOSIS — G4730 Sleep apnea, unspecified: Secondary | ICD-10-CM

## 2013-06-19 DIAGNOSIS — G4733 Obstructive sleep apnea (adult) (pediatric): Secondary | ICD-10-CM

## 2013-08-19 ENCOUNTER — Ambulatory Visit (INDEPENDENT_AMBULATORY_CARE_PROVIDER_SITE_OTHER): Payer: PRIVATE HEALTH INSURANCE | Admitting: Family Medicine

## 2013-08-19 ENCOUNTER — Encounter: Payer: Self-pay | Admitting: Family Medicine

## 2013-08-19 VITALS — BP 120/70 | HR 82 | Temp 97.5°F | Resp 18 | Ht 65.0 in | Wt 265.0 lb

## 2013-08-19 DIAGNOSIS — E1149 Type 2 diabetes mellitus with other diabetic neurological complication: Secondary | ICD-10-CM

## 2013-08-19 DIAGNOSIS — M179 Osteoarthritis of knee, unspecified: Secondary | ICD-10-CM

## 2013-08-19 DIAGNOSIS — M1710 Unilateral primary osteoarthritis, unspecified knee: Secondary | ICD-10-CM

## 2013-08-19 DIAGNOSIS — IMO0002 Reserved for concepts with insufficient information to code with codable children: Secondary | ICD-10-CM

## 2013-08-19 DIAGNOSIS — Z23 Encounter for immunization: Secondary | ICD-10-CM

## 2013-08-19 DIAGNOSIS — E1140 Type 2 diabetes mellitus with diabetic neuropathy, unspecified: Secondary | ICD-10-CM

## 2013-08-19 DIAGNOSIS — G4730 Sleep apnea, unspecified: Secondary | ICD-10-CM

## 2013-08-19 DIAGNOSIS — E1142 Type 2 diabetes mellitus with diabetic polyneuropathy: Secondary | ICD-10-CM

## 2013-08-19 MED ORDER — HYDROCODONE-ACETAMINOPHEN 5-325 MG PO TABS: 1.0000 | ORAL_TABLET | Freq: Two times a day (BID) | ORAL | Status: DC | PRN

## 2013-08-19 NOTE — Patient Instructions (Signed)
Continue current medications We will f/u with [facility]  Referral to Dr. Romeo Apple for your knees Flu shot given F/u 3 months- Physical

## 2013-08-20 NOTE — Assessment & Plan Note (Addendum)
Diabetes is well-controlled appear we'll continue the metformin. We have sent over numerous times for his diabetic shoes I will have the nurse contact [facility] again Unable to leave urine specimen Pneumovax out of stock

## 2013-08-20 NOTE — Assessment & Plan Note (Signed)
We will contact neurology again he needs to have sleep study done he would benefit from this.

## 2013-08-20 NOTE — Assessment & Plan Note (Signed)
Persistent pain and knee. I will have him set up with orthopedics again. I'm not sure there is much intervention that can be done. Of note a wheelchair was ordered for him by [employer] as well back in September, we'll followup on this

## 2013-08-20 NOTE — Assessment & Plan Note (Signed)
Diabetic shoes needed

## 2013-08-20 NOTE — Progress Notes (Signed)
  Subjective:    Patient ID: Kevin [name], male    DOB: [birth year], 56 y.o.   MRN: [id number]  HPI  Patient here follow chronic medical problems. Diabetes mellitus he does not have his blood sugar log with him today. Denies any hypoglycemia. He is being followed by podiatry. He has not received his diabetic shoes Degenerative joint disease-right knee continues to give him difficulties. He does not want followup with [facility] he wants to followup with Dr. Romeo Apple located in [place] for his knee. He continues to have pain and swelling on and off. He is still using the pain medication which helps. He has not received his wheelchair OSA- He still has not had sleep apnea testing done by neurology   Review of Systems  GEN- denies fatigue, fever, weight loss,weakness, recent illness HEENT- denies eye drainage, change in vision, nasal discharge, CVS- denies chest pain, palpitations RESP- denies SOB, cough, wheeze ABD- denies N/V, change in stools, abd pain GU- denies dysuria, hematuria, dribbling, incontinence MSK- + joint pain, muscle aches, injury Neuro- denies headache, dizziness, syncope, seizure activity      Objective:   Physical Exam GEN- NAD, alert and oriented x3, obese,  HEENT- PERRL EOMI, non icteric, MMM, poor dentition,wearing glasses CVS- RRR, no murmur RESP-CTAB EXT- trace edema , no open lesions on feet  Knee- Right knee- no effusion, decreased ROM right knee  Pulses- Radial, DP- 2+        Assessment & Plan:

## 2013-09-19 ENCOUNTER — Telehealth: Payer: Self-pay | Admitting: *Deleted

## 2013-09-19 NOTE — Telephone Encounter (Signed)
Pt has appointment on Dec 12 at 300pm with Dr. Farris Has at [facility] number is [phone], left message with sister Kathie Rhodes to have him to return my call to give him the appt time and date

## 2013-09-19 NOTE — Telephone Encounter (Signed)
Message copied by Samuella Cota on Thu Sep 19, 2013  8:59 AM ------      Message from: Milinda Antis F      Created: Mon Sep 09, 2013  4:31 PM      Regarding: Referral to Glo Herring, I will let Kevin [name] call [facility] and set up his appt for Kevin [name] (He requires a lot of [facility] assistance) since they saw him last. Martie Lee let him know Dr. Romeo Apple did not think there was anything else he could do at this time.  ------

## 2013-09-27 ENCOUNTER — Encounter: Payer: Self-pay | Admitting: Family Medicine

## 2013-09-27 ENCOUNTER — Ambulatory Visit (INDEPENDENT_AMBULATORY_CARE_PROVIDER_SITE_OTHER): Payer: PRIVATE HEALTH INSURANCE | Admitting: Family Medicine

## 2013-09-27 VITALS — BP 100/70 | HR 82 | Temp 97.9°F | Resp 18 | Wt 267.0 lb

## 2013-09-27 DIAGNOSIS — K047 Periapical abscess without sinus: Secondary | ICD-10-CM | POA: Insufficient documentation

## 2013-09-27 MED ORDER — PENICILLIN V POTASSIUM 500 MG PO TABS: 500.0000 mg | ORAL_TABLET | Freq: Three times a day (TID) | ORAL | Status: DC

## 2013-09-27 NOTE — Progress Notes (Signed)
   Subjective:    Patient ID: Kevin [name], male    DOB: 1956-03-22, 57 y.o.   MRN: [id number]  HPI  Patient here with right-sided mouth swelling and dental pain for the past 3 days. His history of poor dentition and has had multiple broken teeth in the mouth. He denies any fever or difficulty breathing or difficulty swallowing   Review of Systems - per above  GEN- denies fatigue, fever, weight loss,weakness, recent illness HEENT- denies eye drainage, change in vision, nasal discharge,+tooth pain Neuro- denies headache, dizziness, syncope, seizure activity      Objective:   Physical Exam  GEN-NAD.alert and oriented x 3 HEENT-PERRL, EOMI, non icteric, MMM, Right upper gumline-broken tooth with small abscess surrounding- white/yellow discharge, TTP, mild swelling of right cheek, poor dentition, post oropharynx clear Neck- shotty Right LAD  Skin- no rash on face     Assessment & Plan:

## 2013-09-27 NOTE — Patient Instructions (Signed)
Start antibiotics  Abscess tooth F/U as previous

## 2013-09-27 NOTE — Assessment & Plan Note (Signed)
Patient with acute dental abscess. This is causing some mild facial swelling. Given red flags if he does not improve with antibiotic to go to the ER. He needs to have the tooth removed. I will contact [facility] to see if they have any services that will take his insurance to have this tooth removed. He does not have any fever or other systemic factors. He is high risk for worsening abscess due to his diabetes mellitus. Start Pen VK Anti-septic OTC mouth rinse

## 2013-10-30 ENCOUNTER — Telehealth: Payer: Self-pay | Admitting: Family Medicine

## 2013-10-30 NOTE — Telephone Encounter (Signed)
Pt is needing to know the status of his nerve medication [employer] back number is [phone]

## 2013-11-19 ENCOUNTER — Encounter: Payer: Self-pay | Admitting: Family Medicine

## 2013-11-19 ENCOUNTER — Ambulatory Visit (INDEPENDENT_AMBULATORY_CARE_PROVIDER_SITE_OTHER): Payer: PRIVATE HEALTH INSURANCE | Admitting: Family Medicine

## 2013-11-19 VITALS — BP 110/70 | HR 80 | Temp 98.3°F | Resp 18 | Ht 68.5 in | Wt 268.0 lb

## 2013-11-19 DIAGNOSIS — I10 Essential (primary) hypertension: Secondary | ICD-10-CM

## 2013-11-19 DIAGNOSIS — G4730 Sleep apnea, unspecified: Secondary | ICD-10-CM

## 2013-11-19 DIAGNOSIS — M542 Cervicalgia: Secondary | ICD-10-CM

## 2013-11-19 DIAGNOSIS — E785 Hyperlipidemia, unspecified: Secondary | ICD-10-CM

## 2013-11-19 DIAGNOSIS — E1149 Type 2 diabetes mellitus with other diabetic neurological complication: Secondary | ICD-10-CM

## 2013-11-19 DIAGNOSIS — Z23 Encounter for immunization: Secondary | ICD-10-CM

## 2013-11-19 DIAGNOSIS — B351 Tinea unguium: Secondary | ICD-10-CM

## 2013-11-19 LAB — CBC WITH DIFFERENTIAL/PLATELET
Basophils Absolute: 0.1 10*3/uL (ref 0.0–0.1)
Basophils Relative: 1 % (ref 0–1)
Eosinophils Absolute: 0.4 10*3/uL (ref 0.0–0.7)
Eosinophils Relative: 4 % (ref 0–5)
HCT: 47.1 % (ref 39.0–52.0)
Hemoglobin: 16.7 g/dL (ref 13.0–17.0)
Lymphocytes Relative: 19 % (ref 12–46)
Lymphs Abs: 1.8 10*3/uL (ref 0.7–4.0)
MCH: 34.9 pg — ABNORMAL HIGH (ref 26.0–34.0)
MCHC: 35.5 g/dL (ref 30.0–36.0)
MCV: 98.3 fL (ref 78.0–100.0)
Monocytes Absolute: 0.7 10*3/uL (ref 0.1–1.0)
Monocytes Relative: 8 % (ref 3–12)
Neutro Abs: 6.4 10*3/uL (ref 1.7–7.7)
Neutrophils Relative %: 68 % (ref 43–77)
Platelets: 222 10*3/uL (ref 150–400)
RBC: 4.79 MIL/uL (ref 4.22–5.81)
RDW: 12.4 % (ref 11.5–15.5)
WBC: 9.4 10*3/uL (ref 4.0–10.5)

## 2013-11-19 LAB — COMPREHENSIVE METABOLIC PANEL
ALT: 51 U/L (ref 0–53)
AST: 46 U/L — ABNORMAL HIGH (ref 0–37)
Albumin: 3.7 g/dL (ref 3.5–5.2)
Alkaline Phosphatase: 70 U/L (ref 39–117)
BUN: 10 mg/dL (ref 6–23)
CO2: 25 mEq/L (ref 19–32)
Calcium: 8.7 mg/dL (ref 8.4–10.5)
Chloride: 100 mEq/L (ref 96–112)
Creat: 0.86 mg/dL (ref 0.50–1.35)
Glucose, Bld: 179 mg/dL — ABNORMAL HIGH (ref 70–99)
Potassium: 4.2 mEq/L (ref 3.5–5.3)
Sodium: 135 mEq/L (ref 135–145)
Total Bilirubin: 0.5 mg/dL (ref 0.2–1.2)
Total Protein: 7.2 g/dL (ref 6.0–8.3)

## 2013-11-19 LAB — LIPID PANEL
Cholesterol: 154 mg/dL (ref 0–200)
HDL: 33 mg/dL — ABNORMAL LOW (ref >39)
LDL Cholesterol: 96 mg/dL (ref 0–99)
Total CHOL/HDL Ratio: 4.7 Ratio
Triglycerides: 125 mg/dL (ref <150)
VLDL: 25 mg/dL (ref 0–40)

## 2013-11-19 LAB — HEMOGLOBIN A1C
Hgb A1c MFr Bld: 7.2 % — ABNORMAL HIGH (ref <5.7)
Mean Plasma Glucose: 160 mg/dL — ABNORMAL HIGH (ref <117)

## 2013-11-19 LAB — MICROALBUMIN / CREATININE URINE RATIO
Creatinine, Urine: 161.5 mg/dL
Microalb Creat Ratio: 11.8 mg/g (ref 0.0–30.0)
Microalb, Ur: 1.91 mg/dL — ABNORMAL HIGH (ref 0.00–1.89)

## 2013-11-19 MED ORDER — HYDROCODONE-ACETAMINOPHEN 5-325 MG PO TABS: 1.0000 | ORAL_TABLET | Freq: Two times a day (BID) | ORAL | Status: DC | PRN

## 2013-11-19 MED ORDER — TERBINAFINE HCL 250 MG PO TABS: 250.0000 mg | ORAL_TABLET | Freq: Every day | ORAL | Status: DC

## 2013-11-19 MED ORDER — FUROSEMIDE 40 MG PO TABS: 40.0000 mg | ORAL_TABLET | Freq: Every day | ORAL | Status: DC

## 2013-11-19 MED ORDER — DICLOFENAC SODIUM 75 MG PO TBEC: 75.0000 mg | DELAYED_RELEASE_TABLET | Freq: Two times a day (BID) | ORAL | Status: DC

## 2013-11-19 MED ORDER — AMLODIPINE BESYLATE 5 MG PO TABS: 5.0000 mg | ORAL_TABLET | Freq: Every day | ORAL | Status: DC

## 2013-11-19 MED ORDER — FLUOXETINE HCL 20 MG PO CAPS: 20.0000 mg | ORAL_CAPSULE | Freq: Every day | ORAL | Status: DC

## 2013-11-19 NOTE — Assessment & Plan Note (Signed)
We'll treat with antifungal for 12 weeks

## 2013-11-19 NOTE — Assessment & Plan Note (Signed)
Continue metformin check labs Continue statin drug

## 2013-11-19 NOTE — Patient Instructions (Signed)
Start NEW medication diclofenac take 1 tablet with breakfast and dinner for the next 4 weeks,, I am concerned about pinched nerve in your neck, this helps inflammation Get the xray of your neck Continue pain medication We will call with lab results Follow-up with Dr. Merlene Laughter, your tremor medication- Amantadine is not available in the pharmacy at this time , this may need to be changed Pneumonia shot given F/U 3 months

## 2013-11-19 NOTE — Assessment & Plan Note (Signed)
Patient needs to have sleep apnea test set up again by neurology would recommend that he does have this done at the sleep Center incident currently do a home test

## 2013-11-19 NOTE — Assessment & Plan Note (Signed)
Well controlled 

## 2013-11-19 NOTE — Assessment & Plan Note (Signed)
Check FLP and LFT On Zocor

## 2013-11-19 NOTE — Progress Notes (Signed)
Patient ID: [name], male   DOB: 04-20-1956, 58 y.o.   MRN: [id number]   Subjective:    Patient ID: Kevin [name], male    DOB: 07/20/1956, 58 y.o.   MRN: [id number]  Patient presents for 3 mos follow up  patient complains of left-sided neck pain with some radicular symptoms into his left arm. He states it started about 2 weeks ago after he ran out of his amantadine medication but also it feels similar to when he had a pinched nerve in his neck. He denies any weakness in the arms he also has known carpal tunnel syndrome which he is currently wearing braces for.  Tremor-he is followup with neurology next week also to discuss the medication amantadine as this is not available in the pharmacy  Diabetes mellitus his blood sugars have been running good per report he does not have his meter with him his last A1c was 6.5% he is currently on metformin as well as statin drug at bedtime  He is currently being followed by [facility]- note was reviewed Pneumonia vaccine given today  He still has not had his sleep apnea test done but states that he is sleeping well this will need to be followup with the neurology appointment next week I will send over my notes in regards to this   Review Of Systems:  GEN- denies fatigue, fever, weight loss,weakness, recent illness HEENT- denies eye drainage, change in vision, nasal discharge, CVS- denies chest pain, palpitations RESP- denies SOB, cough, wheeze ABD- denies N/V, change in stools, abd pain GU- denies dysuria, hematuria, dribbling, incontinence MSK- denies joint pain, muscle aches, injury Neuro- denies headache, dizziness, syncope, seizure activity       Objective:    BP 110/70  Pulse 80  Temp(Src) 98.3 F (36.8 C) (Oral)  Resp 18  Ht 5' 8.5" (1.74 m)  Wt 268 lb (121.564 kg)  BMI 40.15 kg/m2 GEN- NAD, alert and oriented x3 HEENT- PERRL, EOMI, non injected sclera, pink conjunctiva, MMM, oropharynx clear Neck- Supple, fair ROM, neg  spurlings CVS- RRR, ? Soft 2/6 SEM left sternal base RESP-CTAB Neuro- Motor equal bilat UE, normal tone, sensation grossly in tact EXT- No edema Pulses- Radial, DP- 2+ Feet-thickened nails- orange yellow color, very brittle on some nails bilat         Assessment & Plan:      Problem List Items Addressed This Visit   Type II or unspecified type diabetes mellitus with neurological manifestations, not stated as uncontrolled(250.60) - Primary     Continue metformin check labs Continue statin drug      Relevant Orders      Comprehensive metabolic panel      Hemoglobin A1c      CBC with Differential      Microalbumin/Creatinine Ratio, Urine   Hyperlipidemia     Check FLP and LFT On Zocor    Relevant Medications      furosemide (LASIX) tablet      amLODIpine (NORVASC) tablet   Other Relevant Orders      Lipid panel    Other Visit Diagnoses   Need for prophylactic vaccination against Streptococcus pneumoniae (pneumococcus)        Relevant Orders       Pneumococcal polysaccharide vaccine 23-valent greater than or equal to 2yo subcutaneous/IM (Completed)    Neck pain on left side        Relevant Orders       DG Cervical Spine Complete  Note: This dictation was prepared with Dragon dictation along with smaller phrase technology. Any transcriptional errors that result from this process are unintentional.

## 2013-11-20 MED ORDER — METFORMIN HCL 1000 MG PO TABS: 1000.0000 mg | ORAL_TABLET | Freq: Two times a day (BID) | ORAL | Status: DC

## 2013-11-20 MED ORDER — SIMVASTATIN 10 MG PO TABS: 10.0000 mg | ORAL_TABLET | Freq: Every evening | ORAL | Status: DC

## 2013-11-20 NOTE — Addendum Note (Signed)
Addended by: Vic Blackbird F on: 11/20/2013 01:32 PM   Modules accepted: Orders

## 2013-12-16 ENCOUNTER — Other Ambulatory Visit: Payer: Self-pay | Admitting: Family Medicine

## 2013-12-16 DIAGNOSIS — E119 Type 2 diabetes mellitus without complications: Secondary | ICD-10-CM

## 2013-12-16 NOTE — Telephone Encounter (Signed)
rx for diabetic supplies printed and faxed to pharmacy

## 2014-02-17 ENCOUNTER — Encounter: Payer: Self-pay | Admitting: Family Medicine

## 2014-02-17 ENCOUNTER — Ambulatory Visit (INDEPENDENT_AMBULATORY_CARE_PROVIDER_SITE_OTHER): Payer: PRIVATE HEALTH INSURANCE | Admitting: Family Medicine

## 2014-02-17 VITALS — BP 138/76 | HR 64 | Temp 98.0°F | Resp 16 | Ht 69.0 in | Wt 269.0 lb

## 2014-02-17 DIAGNOSIS — I10 Essential (primary) hypertension: Secondary | ICD-10-CM

## 2014-02-17 DIAGNOSIS — E1142 Type 2 diabetes mellitus with diabetic polyneuropathy: Secondary | ICD-10-CM

## 2014-02-17 DIAGNOSIS — S0010XA Contusion of unspecified eyelid and periocular area, initial encounter: Secondary | ICD-10-CM

## 2014-02-17 DIAGNOSIS — G4730 Sleep apnea, unspecified: Secondary | ICD-10-CM

## 2014-02-17 DIAGNOSIS — J309 Allergic rhinitis, unspecified: Secondary | ICD-10-CM

## 2014-02-17 DIAGNOSIS — E669 Obesity, unspecified: Secondary | ICD-10-CM

## 2014-02-17 DIAGNOSIS — J302 Other seasonal allergic rhinitis: Secondary | ICD-10-CM | POA: Insufficient documentation

## 2014-02-17 DIAGNOSIS — E1140 Type 2 diabetes mellitus with diabetic neuropathy, unspecified: Secondary | ICD-10-CM

## 2014-02-17 DIAGNOSIS — Z550 Illiteracy and low-level literacy: Secondary | ICD-10-CM

## 2014-02-17 DIAGNOSIS — Z559 Problems related to education and literacy, unspecified: Secondary | ICD-10-CM

## 2014-02-17 DIAGNOSIS — E1149 Type 2 diabetes mellitus with other diabetic neurological complication: Secondary | ICD-10-CM

## 2014-02-17 LAB — BASIC METABOLIC PANEL WITH GFR
BUN: 12 mg/dL (ref 6–23)
CO2: 25 mEq/L (ref 19–32)
Calcium: 8.4 mg/dL (ref 8.4–10.5)
Chloride: 102 mEq/L (ref 96–112)
Creat: 0.8 mg/dL (ref 0.50–1.35)
GFR, Est African American: >89 mL/min
GFR, Est Non African American: >89 mL/min
Glucose, Bld: 232 mg/dL — ABNORMAL HIGH (ref 70–99)
Potassium: 4.5 mEq/L (ref 3.5–5.3)
Sodium: 136 mEq/L (ref 135–145)

## 2014-02-17 LAB — HEMOGLOBIN A1C
Hgb A1c MFr Bld: 7.3 % — ABNORMAL HIGH (ref <5.7)
Mean Plasma Glucose: 163 mg/dL — ABNORMAL HIGH (ref <117)

## 2014-02-17 MED ORDER — CETIRIZINE HCL 10 MG PO TABS: 10.0000 mg | ORAL_TABLET | Freq: Every day | ORAL | Status: DC

## 2014-02-17 NOTE — Patient Instructions (Signed)
New allergy pill Continue current medications We will send [facility] out to fill up medication box F/U 3 months

## 2014-02-17 NOTE — Assessment & Plan Note (Signed)
Blood pressure looks okay we'll continue him on current medications

## 2014-02-17 NOTE — Assessment & Plan Note (Signed)
The swelling and bruising has actually improved significantly. His eye movements are intact and his vision is stable I do not think this warrants any further workup at this time

## 2014-02-17 NOTE — Assessment & Plan Note (Signed)
Unchanged, [facility] assisting

## 2014-02-17 NOTE — Progress Notes (Signed)
Patient ID: LONZIE SIMMER, male   DOB: 1956-01-17, 58 y.o.   MRN: [id number]   Subjective:    Patient ID: GEMAYEL MASCIO, male    DOB: [birth year], 58 y.o.   MRN: [id number]  Patient presents for 3 month F/U and R eye issue  patient here to follow chronic medical problems. He does state he was seen by neurology last week and he was started on new medication for his tremor which has helped significantly. He states that he was not set up for sleep study once again.  He states that week ago he was walking and ran into a door he has some bruising underneath his right eye occasional tingling but no pain and he is able to move his eye around without any difficulty no change to his eyesight. The bruising is actually starting to go away.  Diabetes mellitus- his last A1c was increased from 7.2% his metformin was changed to 1000 mg twice a day as we often have difficulty with his diet  He did not bring any medications with him today.  He has no trouble with his allergies and requests a medication for this Note he was also seen by podiatry- Dr. Blanch Media  Review Of Systems:  GEN- denies fatigue, fever, weight loss,weakness, recent illness HEENT- denies eye drainage, change in vision, nasal discharge, CVS- denies chest pain, palpitations RESP- denies SOB, cough, wheeze ABD- denies N/V, change in stools, abd pain GU- denies dysuria, hematuria, dribbling, incontinence MSK- denies joint pain, muscle aches, injury Neuro- denies headache, dizziness, syncope, seizure activity       Objective:    BP 138/76  Pulse 64  Temp(Src) 98 F (36.7 C) (Oral)  Resp 16  Ht 5\' 9"  (1.753 m)  Wt 269 lb (122.018 kg)  BMI 39.71 kg/m2 GEN- NAD, alert and oriented x3 HEENT- PERRL, EOMI, non injected sclera, pink conjunctiva, MMM, oropharynx clear, Right orbit in tact, NT to palpation over area of ecchymosis CVS- RRR,  Soft 2/6 SEM left sternal base RESP-CTAB Skin- mild bruising seen right lower orbital  region EXT- No edema Pulses- Radial, DP- 2+        Assessment & Plan:      Problem List Items Addressed This Visit   Unspecified sleep apnea   Type II or unspecified type diabetes mellitus with neurological manifestations, not stated as uncontrolled(250.60)   Relevant Orders      BASIC METABOLIC PANEL WITH GFR      Hemoglobin A1c   Obesity   Neuropathy, diabetic   Essential hypertension, benign - Primary      Note: This dictation was prepared with Dragon dictation along with smaller phrase technology. Any transcriptional errors that result from this process are unintentional.

## 2014-02-17 NOTE — Assessment & Plan Note (Addendum)
Recheck A1c He is on statin drugs, not tolerate ACE inhibitors And try to keep his regimen simplified as much as possible

## 2014-02-17 NOTE — Assessment & Plan Note (Signed)
We will contact neurology again to see what the difficulty is regarding the sleep study, it may just be misunderstanding and he has just missed the appointments

## 2014-02-17 NOTE — Assessment & Plan Note (Signed)
Zyrtec prn given

## 2014-02-17 NOTE — Addendum Note (Signed)
Addended by: Vic Blackbird F on: 02/17/2014 02:40 PM   Modules accepted: Orders

## 2014-04-23 ENCOUNTER — Telehealth: Payer: Self-pay | Admitting: *Deleted

## 2014-04-23 MED ORDER — BLOOD GLUCOSE METER KIT: PACK | Status: DC

## 2014-04-23 NOTE — Telephone Encounter (Signed)
Message copied by Sheral Flow on Wed Apr 23, 2014 12:57 PM ------      Message from: Daylene Posey T      Created: Wed Apr 23, 2014 12:43 PM                   ----- Message -----         From: Army Melia, RN         Sent: 04/22/2014   5:06 PM           To: Daylene Posey, CMA            Willodean Rosenthal,             Kevin [name] is eligible to receive his glucose test strips through the [facility] and I'd like to get him switched over to there because he'll have them delivered regularly and I can be certain he isn't about to run out (like this week!). I'm going to get enough strips for him to last until his [facility] delivery comes (approximately 14 days after they receive the prescription). If Dr. Buelah Manis agrees, would you please send a new prescription for glucose meter and strips to the following fax number: [phone].             For reference: I spoke with [facility] [place] @ [phone].             Thank you!      [facility]       [phone]              ------

## 2014-04-23 NOTE — Telephone Encounter (Signed)
Okay to order?

## 2014-04-23 NOTE — Telephone Encounter (Signed)
Order e-scribed to [facility].

## 2014-04-23 NOTE — Telephone Encounter (Signed)
Ok to order 

## 2014-04-24 ENCOUNTER — Telehealth: Payer: Self-pay | Admitting: *Deleted

## 2014-04-24 NOTE — Telephone Encounter (Signed)
Orson Ape called stating that she did a routine visit on this pt and wanted you to know that he has a vol. Overload with 2-3+edema, his abdomen is swollen and firm , he has mild intermittant cough, he is taking Lasix 40mg  as directed, she says is up in his weight by 8.9lbs since last month. Lars Mage wants to know if you want to double up on his lasix to  80mg  until swelling is down?

## 2014-04-25 NOTE — Telephone Encounter (Signed)
Call placed to patient. VM left to return call.   Will make [facility] nurse aware for F/U.

## 2014-04-25 NOTE — Telephone Encounter (Signed)
Increase lasix to 40mg  BID x 5 days Schedule appt if he doesn't have one

## 2014-04-28 ENCOUNTER — Other Ambulatory Visit: Payer: Self-pay | Admitting: *Deleted

## 2014-04-28 MED ORDER — FUROSEMIDE 40 MG PO TABS: 40.0000 mg | ORAL_TABLET | Freq: Every day | ORAL | Status: DC

## 2014-04-28 NOTE — Telephone Encounter (Signed)
Call returned by patient.   Requested to have meds sent to [place].   Appointment scheduled.

## 2014-04-29 ENCOUNTER — Telehealth: Payer: Self-pay | Admitting: *Deleted

## 2014-04-29 DIAGNOSIS — E119 Type 2 diabetes mellitus without complications: Secondary | ICD-10-CM

## 2014-04-29 NOTE — Telephone Encounter (Signed)
Message copied by Maureen Chatters on Tue Apr 29, 2014 12:53 PM ------      Message from: Sheral Flow      Created: Tue Apr 29, 2014 12:28 PM       Do I need to put the order in?                  ----- Message -----         From: Army Melia, RN         Sent: 04/29/2014  12:16 PM           To: Daylene Posey, [place], [place] Lathe Six, LPN            Hi Christina and [place],             I'm sorry I didn't include this request in the other message. I contacted Dr. Irving Shows Cody's office in [place] [phone]) for a new podiatry appointment (05/15/14 @ 1:30). They do, however, need an official referral. If you wouldn't mind sending, I would appreciate it.             Thank you!      [facility]       [phone]                    ------

## 2014-04-29 NOTE — Telephone Encounter (Signed)
Referral placed to Podiatry to Dr. Steffanie Rainwater in [place]

## 2014-05-05 ENCOUNTER — Ambulatory Visit: Payer: Self-pay | Admitting: Family Medicine

## 2014-05-05 ENCOUNTER — Other Ambulatory Visit: Payer: Self-pay | Admitting: *Deleted

## 2014-05-06 ENCOUNTER — Telehealth: Payer: Self-pay | Admitting: *Deleted

## 2014-05-06 NOTE — Telephone Encounter (Signed)
Message copied by Maureen Chatters on Tue May 06, 2014  3:00 PM ------      Message from: Clerance Lav      Created: Mon May 05, 2014 10:49 AM      Regarding: RE: rescheduled appt      Contact: [phone]       [facility],             Just getting this. Sorry. Had visits all day on Friday and didn't check this. We generally call and use [facility] e-mail. I use this EPIC messaging because it's usually easier for provider offices as you are in EPIC all day (we're in a different documentation system).             Did Mr. Korinek come in? If not, yes - we can schedule him for another day but he'll need 5 days notice for transportation. I can see him again this week just to make sure we're not in any trouble.             Thank you,             Alisa                   ----- Message -----         From: Daylene Posey, CMA         Sent: 05/02/2014  10:38 AM           To: Army Melia, RN, [place] Lathe Six, LPN      Subject: rescheduled appt                                         Good Morning Alisa,            Just informing you that Lael Pilch appt for Monday as been cancelled d/t Dr. Buelah Manis will not be in the office at his appointment time, I called the patient and he said to cancel that appointment because of transportation and he will keep the August appt, but I see his appt for Monday was for F/U edema. She will be in the office that morning until 10am but pt said his transportation will not bring him until appointment time. Just wanted to let you know and she if we can schedule him for another day that same week.            Thanks      [place]       ------

## 2014-05-07 ENCOUNTER — Telehealth: Payer: Self-pay | Admitting: *Deleted

## 2014-05-07 NOTE — Telephone Encounter (Signed)
Message copied by Maureen Chatters on Wed May 07, 2014  4:52 PM ------      Message from: Clerance Lav      Created: Wed May 07, 2014  8:59 AM      Regarding: RE: rescheduled appt      Contact: [phone]       Sounds good Treylen Gibbs. I'll see him this  Week.             Thank you,       Alisa       ----- Message -----         From: Daylene Posey, CMA         Sent: 05/06/2014   2:56 PM           To: Army Melia, RN      Subject: RE: rescheduled appt                                     No he did not come in, he actually cancelled it d/t to Dr. Buelah Manis not being in the office, I did ask him if he wanted to reschedule and he stated that he would wait until his next appointment which is on the aug 18. I told him I would let you know as well, and that we can just monitor and see what it looks like when you arrive for his home visit.      Please let me know if I can do any thing else.            Thanks      ----- Message -----         From: Army Melia, RN         Sent: 05/05/2014  10:49 AM           To: Daylene Posey, CMA      Subject: RE: rescheduled appt                                     Hi Crissy Mccreadie,             Just getting this. Sorry. Had visits all day on Friday and didn't check this. We generally call and use [facility] e-mail. I use this EPIC messaging because it's usually easier for provider offices as you are in EPIC all day (we're in a different documentation system).             Did Mr. Kohles come in? If not, yes - we can schedule him for another day but he'll need 5 days notice for transportation. I can see him again this week just to make sure we're not in any trouble.             Thank you,             Alisa                   ----- Message -----         From: Daylene Posey, CMA         Sent: 05/02/2014  10:38 AM           To: Army Melia, RN, [place] Lathe Six, LPN      Subject: rescheduled appt  Good Morning Alisa,            Just informing you that Bently Wyss appt for Monday as been cancelled d/t Dr. Buelah Manis will not be in the office at his appointment time, I called the patient and he said to cancel that appointment because of transportation and he will keep the August appt, but I see his appt for Monday was for F/U edema. She will be in the office that morning until 10am but pt said his transportation will not bring him until appointment time. Just wanted to let you know and she if we can schedule him for another day that same week.            Thanks      [place]                   ------

## 2014-05-21 ENCOUNTER — Ambulatory Visit: Payer: PRIVATE HEALTH INSURANCE | Admitting: Family Medicine

## 2014-05-28 ENCOUNTER — Encounter: Payer: Self-pay | Admitting: Family Medicine

## 2014-05-28 ENCOUNTER — Ambulatory Visit (INDEPENDENT_AMBULATORY_CARE_PROVIDER_SITE_OTHER): Payer: PRIVATE HEALTH INSURANCE | Admitting: Family Medicine

## 2014-05-28 VITALS — BP 130/78 | HR 76 | Temp 98.1°F | Resp 14 | Ht 68.0 in | Wt 268.0 lb

## 2014-05-28 DIAGNOSIS — M7918 Myalgia, other site: Secondary | ICD-10-CM

## 2014-05-28 DIAGNOSIS — I10 Essential (primary) hypertension: Secondary | ICD-10-CM

## 2014-05-28 DIAGNOSIS — IMO0001 Reserved for inherently not codable concepts without codable children: Secondary | ICD-10-CM

## 2014-05-28 DIAGNOSIS — R609 Edema, unspecified: Secondary | ICD-10-CM

## 2014-05-28 DIAGNOSIS — R600 Localized edema: Secondary | ICD-10-CM

## 2014-05-28 DIAGNOSIS — E1141 Type 2 diabetes mellitus with diabetic mononeuropathy: Secondary | ICD-10-CM

## 2014-05-28 DIAGNOSIS — E669 Obesity, unspecified: Secondary | ICD-10-CM

## 2014-05-28 DIAGNOSIS — E1149 Type 2 diabetes mellitus with other diabetic neurological complication: Secondary | ICD-10-CM

## 2014-05-28 DIAGNOSIS — E785 Hyperlipidemia, unspecified: Secondary | ICD-10-CM

## 2014-05-28 DIAGNOSIS — G589 Mononeuropathy, unspecified: Secondary | ICD-10-CM

## 2014-05-28 LAB — CBC WITH DIFFERENTIAL/PLATELET
Basophils Absolute: 0.1 10*3/uL (ref 0.0–0.1)
Basophils Relative: 1 % (ref 0–1)
Eosinophils Absolute: 0.3 10*3/uL (ref 0.0–0.7)
Eosinophils Relative: 4 % (ref 0–5)
HCT: 40.7 % (ref 39.0–52.0)
Hemoglobin: 13.8 g/dL (ref 13.0–17.0)
Lymphocytes Relative: 22 % (ref 12–46)
Lymphs Abs: 1.7 10*3/uL (ref 0.7–4.0)
MCH: 31.6 pg (ref 26.0–34.0)
MCHC: 33.9 g/dL (ref 30.0–36.0)
MCV: 93.1 fL (ref 78.0–100.0)
Monocytes Absolute: 0.7 10*3/uL (ref 0.1–1.0)
Monocytes Relative: 9 % (ref 3–12)
Neutro Abs: 4.9 10*3/uL (ref 1.7–7.7)
Neutrophils Relative %: 64 % (ref 43–77)
Platelets: 250 10*3/uL (ref 150–400)
RBC: 4.37 MIL/uL (ref 4.22–5.81)
RDW: 13.5 % (ref 11.5–15.5)
WBC: 7.6 10*3/uL (ref 4.0–10.5)

## 2014-05-28 LAB — LIPID PANEL
Cholesterol: 176 mg/dL (ref 0–200)
HDL: 34 mg/dL — ABNORMAL LOW (ref >39)
LDL Cholesterol: 113 mg/dL — ABNORMAL HIGH (ref 0–99)
Total CHOL/HDL Ratio: 5.2 Ratio
Triglycerides: 146 mg/dL (ref <150)
VLDL: 29 mg/dL (ref 0–40)

## 2014-05-28 LAB — COMPREHENSIVE METABOLIC PANEL
ALT: 37 U/L (ref 0–53)
AST: 37 U/L (ref 0–37)
Albumin: 3.6 g/dL (ref 3.5–5.2)
Alkaline Phosphatase: 69 U/L (ref 39–117)
BUN: 11 mg/dL (ref 6–23)
CO2: 24 mEq/L (ref 19–32)
Calcium: 8.6 mg/dL (ref 8.4–10.5)
Chloride: 102 mEq/L (ref 96–112)
Creat: 0.77 mg/dL (ref 0.50–1.35)
Glucose, Bld: 217 mg/dL — ABNORMAL HIGH (ref 70–99)
Potassium: 4.6 mEq/L (ref 3.5–5.3)
Sodium: 134 mEq/L — ABNORMAL LOW (ref 135–145)
Total Bilirubin: 0.3 mg/dL (ref 0.2–1.2)
Total Protein: 7.4 g/dL (ref 6.0–8.3)

## 2014-05-28 LAB — HEMOGLOBIN A1C
Hgb A1c MFr Bld: 7.8 % — ABNORMAL HIGH (ref <5.7)
Mean Plasma Glucose: 177 mg/dL — ABNORMAL HIGH (ref <117)

## 2014-05-28 MED ORDER — HYDROCODONE-ACETAMINOPHEN 5-325 MG PO TABS: 1.0000 | ORAL_TABLET | Freq: Two times a day (BID) | ORAL | Status: DC | PRN

## 2014-05-28 MED ORDER — POTASSIUM CHLORIDE ER 10 MEQ PO TBCR: 10.0000 meq | EXTENDED_RELEASE_TABLET | Freq: Two times a day (BID) | ORAL | Status: DC

## 2014-05-28 MED ORDER — FUROSEMIDE 40 MG PO TABS: 40.0000 mg | ORAL_TABLET | Freq: Two times a day (BID) | ORAL | Status: DC

## 2014-05-28 NOTE — Patient Instructions (Signed)
Take the water pill twice a day, take with potassium  We will call with your labs  Take your pain pill as needed  F/U 3 months

## 2014-05-29 DIAGNOSIS — M7918 Myalgia, other site: Secondary | ICD-10-CM | POA: Insufficient documentation

## 2014-05-29 NOTE — Assessment & Plan Note (Signed)
Well controlled 

## 2014-05-29 NOTE — Assessment & Plan Note (Signed)
Goal , 7% Compliance continues to be an issue May need additional medication, consider Invokana or tradjenta

## 2014-05-29 NOTE — Progress Notes (Signed)
Patient ID: Kevin [name], male   DOB: [birth year], 58 y.o.   MRN: [id number]   Subjective:    Patient ID: Kevin [name], male    DOB: 07-22-1956, 58 y.o.   MRN: [id number]  Patient presents for 3 month F/U and Knot to abdomen 1. DM- did not bring meter with him, from [facility] his compliance is improving with most of his medications, he does not watch his diet. 2. Leg edema- continues to have swelling much improved with BID dosing of lasix  3. Knot on abdomen, states it was initiall on left flank a few years ago but pain moves around, pain medicine helps. Occurred after he help some one move some antique furniture but this was > 20 years ago, denies any N/V, no change inbowels, no dysuria, no recent falls    Review Of Systems:  GEN- denies fatigue, fever, weight loss,weakness, recent illness HEENT- denies eye drainage, change in vision, nasal discharge, CVS- denies chest pain, palpitations RESP- denies SOB, cough, wheeze ABD- denies N/V, change in stools, abd pain GU- denies dysuria, hematuria, dribbling, incontinence MSK- + joint pain, muscle aches, injury Neuro- denies headache, dizziness, syncope, seizure activity       Objective:    BP 130/78  Pulse 76  Temp(Src) 98.1 F (36.7 C) (Oral)  Resp 14  Ht 5\' 8"  (1.727 m)  Wt 268 lb (121.564 kg)  BMI 40.76 kg/m2 GEN- NAD, alert and oriented x3 HEENT- PERRL, EOMI, non injected sclera, pink conjunctiva, MMM, oropharynx clear,  CVS- RRR,  Soft 2/6 SEM left sternal base RESP-CTAB ABD-NABS,soft, mild ttp over lower ribs, no erythema,no bruising, no mass, no CVA tenderness EXT-1+ edema Pulses- Radial 2+, DP- 1+        Assessment & Plan:      Problem List Items Addressed This Visit   Type II or unspecified type diabetes mellitus with neurological manifestations, not stated as uncontrolled(250.60) - Primary   Relevant Orders      Comprehensive metabolic panel (Completed)      CBC with Differential (Completed)   Hemoglobin A1c (Completed)   Leg edema   Hyperlipidemia   Relevant Medications      furosemide (LASIX) tablet   Other Relevant Orders      Lipid panel (Completed)   Essential hypertension, benign   Relevant Medications      furosemide (LASIX) tablet      Note: This dictation was prepared with Dragon dictation along with smaller phrase technology. Any transcriptional errors that result from this process are unintentional.

## 2014-05-29 NOTE — Assessment & Plan Note (Signed)
MSK pain of ribs, no true abd pain, he can use his arthritis medication, given refill on norco

## 2014-05-29 NOTE — Assessment & Plan Note (Signed)
Continue 40mg  BID with potassium

## 2014-05-31 ENCOUNTER — Other Ambulatory Visit: Payer: Self-pay | Admitting: *Deleted

## 2014-05-31 MED ORDER — SIMVASTATIN 20 MG PO TABS: 20.0000 mg | ORAL_TABLET | Freq: Every evening | ORAL | Status: DC

## 2014-05-31 MED ORDER — SITAGLIPTIN PHOSPHATE 100 MG PO TABS: 100.0000 mg | ORAL_TABLET | Freq: Every day | ORAL | Status: DC

## 2014-06-27 ENCOUNTER — Other Ambulatory Visit: Payer: Self-pay | Admitting: *Deleted

## 2014-06-27 MED ORDER — NITROGLYCERIN 0.4 MG SL SUBL: 0.4000 mg | SUBLINGUAL_TABLET | SUBLINGUAL | Status: DC | PRN

## 2014-06-27 NOTE — Telephone Encounter (Signed)
Received e-mail from [facility].   Requested refill on Nitrostat.   Prescription sent to pharmacy.

## 2014-07-10 ENCOUNTER — Other Ambulatory Visit: Payer: Self-pay | Admitting: Family Medicine

## 2014-07-11 NOTE — Telephone Encounter (Signed)
Medication refilled per protocol. 

## 2014-07-17 ENCOUNTER — Encounter (HOSPITAL_COMMUNITY): Payer: Self-pay | Admitting: Emergency Medicine

## 2014-07-17 ENCOUNTER — Emergency Department (HOSPITAL_COMMUNITY)
Admission: EM | Admit: 2014-07-17 | Discharge: 2014-07-17 | Discharge status: Against Medical Advice | Payer: PRIVATE HEALTH INSURANCE | Attending: Emergency Medicine | Admitting: Emergency Medicine

## 2014-07-17 DIAGNOSIS — Z720 Tobacco use: Secondary | ICD-10-CM | POA: Diagnosis not present

## 2014-07-17 DIAGNOSIS — R531 Weakness: Secondary | ICD-10-CM | POA: Diagnosis not present

## 2014-07-17 DIAGNOSIS — G8929 Other chronic pain: Secondary | ICD-10-CM | POA: Insufficient documentation

## 2014-07-17 DIAGNOSIS — E669 Obesity, unspecified: Secondary | ICD-10-CM | POA: Diagnosis not present

## 2014-07-17 DIAGNOSIS — E119 Type 2 diabetes mellitus without complications: Secondary | ICD-10-CM | POA: Insufficient documentation

## 2014-07-17 LAB — CBC WITH DIFFERENTIAL/PLATELET
Basophils Absolute: 0.1 10*3/uL (ref 0.0–0.1)
Basophils Relative: 1 % (ref 0–1)
Eosinophils Absolute: 0.4 10*3/uL (ref 0.0–0.7)
Eosinophils Relative: 4 % (ref 0–5)
HCT: 46.4 % (ref 39.0–52.0)
Hemoglobin: 15.2 g/dL (ref 13.0–17.0)
Lymphocytes Relative: 20 % (ref 12–46)
Lymphs Abs: 1.8 10*3/uL (ref 0.7–4.0)
MCH: 30 pg (ref 26.0–34.0)
MCHC: 32.8 g/dL (ref 30.0–36.0)
MCV: 91.5 fL (ref 78.0–100.0)
Monocytes Absolute: 0.8 10*3/uL (ref 0.1–1.0)
Monocytes Relative: 9 % (ref 3–12)
Neutro Abs: 5.7 10*3/uL (ref 1.7–7.7)
Neutrophils Relative %: 66 % (ref 43–77)
Platelets: 206 10*3/uL (ref 150–400)
RBC: 5.07 MIL/uL (ref 4.22–5.81)
RDW: 13.4 % (ref 11.5–15.5)
WBC: 8.7 10*3/uL (ref 4.0–10.5)

## 2014-07-17 LAB — COMPREHENSIVE METABOLIC PANEL
ALT: 61 U/L — ABNORMAL HIGH (ref 0–53)
AST: 56 U/L — ABNORMAL HIGH (ref 0–37)
Albumin: 3.9 g/dL (ref 3.5–5.2)
Alkaline Phosphatase: 97 U/L (ref 39–117)
Anion gap: 13 (ref 5–15)
BUN: 14 mg/dL (ref 6–23)
CO2: 26 mEq/L (ref 19–32)
Calcium: 10.1 mg/dL (ref 8.4–10.5)
Chloride: 101 mEq/L (ref 96–112)
Creatinine, Ser: 0.71 mg/dL (ref 0.50–1.35)
GFR calc Af Amer: >90 mL/min (ref >90)
GFR calc non Af Amer: >90 mL/min (ref >90)
Glucose, Bld: 204 mg/dL — ABNORMAL HIGH (ref 70–99)
Potassium: 4.5 mEq/L (ref 3.7–5.3)
Sodium: 140 mEq/L (ref 137–147)
Total Bilirubin: 0.3 mg/dL (ref 0.3–1.2)
Total Protein: 8.5 g/dL — ABNORMAL HIGH (ref 6.0–8.3)

## 2014-07-17 NOTE — ED Notes (Signed)
No answer

## 2014-07-17 NOTE — ED Notes (Signed)
Unable to locate

## 2014-07-17 NOTE — ED Notes (Signed)
cbg was 407 when checked at home by his nurse. Sent to ER, feels "a little weak"No NVD

## 2014-08-11 ENCOUNTER — Other Ambulatory Visit: Payer: Self-pay | Admitting: *Deleted

## 2014-08-11 MED ORDER — AMLODIPINE BESYLATE 5 MG PO TABS: 5.0000 mg | ORAL_TABLET | Freq: Every day | ORAL | Status: DC

## 2014-08-11 MED ORDER — NABUMETONE 750 MG PO TABS: 750.0000 mg | ORAL_TABLET | Freq: Two times a day (BID) | ORAL | Status: DC

## 2014-08-11 MED ORDER — NITROGLYCERIN 0.4 MG SL SUBL: SUBLINGUAL_TABLET | SUBLINGUAL | Status: DC

## 2014-08-11 MED ORDER — METFORMIN HCL 1000 MG PO TABS: 1000.0000 mg | ORAL_TABLET | Freq: Two times a day (BID) | ORAL | Status: DC

## 2014-08-11 MED ORDER — POTASSIUM CHLORIDE ER 10 MEQ PO TBCR: 10.0000 meq | EXTENDED_RELEASE_TABLET | Freq: Two times a day (BID) | ORAL | Status: DC

## 2014-08-11 MED ORDER — DICLOFENAC SODIUM 1 % TD GEL: TRANSDERMAL | Status: DC

## 2014-08-11 MED ORDER — SIMVASTATIN 20 MG PO TABS: 20.0000 mg | ORAL_TABLET | Freq: Every evening | ORAL | Status: DC

## 2014-08-11 MED ORDER — CETIRIZINE HCL 10 MG PO TABS: 10.0000 mg | ORAL_TABLET | Freq: Every day | ORAL | Status: DC

## 2014-08-11 MED ORDER — FOLIC ACID 1 MG PO TABS: 1.0000 mg | ORAL_TABLET | Freq: Every day | ORAL | Status: DC

## 2014-08-11 MED ORDER — SITAGLIPTIN PHOSPHATE 100 MG PO TABS: 100.0000 mg | ORAL_TABLET | Freq: Every day | ORAL | Status: DC

## 2014-08-11 MED ORDER — FLUOXETINE HCL 20 MG PO CAPS: 20.0000 mg | ORAL_CAPSULE | Freq: Every day | ORAL | Status: DC

## 2014-08-11 MED ORDER — DICLOFENAC SODIUM 75 MG PO TBEC: DELAYED_RELEASE_TABLET | ORAL | Status: DC

## 2014-08-11 MED ORDER — FUROSEMIDE 40 MG PO TABS: 40.0000 mg | ORAL_TABLET | Freq: Two times a day (BID) | ORAL | Status: DC

## 2014-08-11 NOTE — Telephone Encounter (Signed)
Received call from Janalyn Shy, [facility] nurse.   States that patient requires refill on medications to [facility].   Prescriptions sent to pharmacy.

## 2014-08-13 ENCOUNTER — Telehealth: Payer: Self-pay | Admitting: *Deleted

## 2014-08-13 NOTE — Telephone Encounter (Signed)
Received e-mail requesting PA on Voltaren Gel.   PA submitted.

## 2014-08-14 NOTE — Telephone Encounter (Signed)
Received PA determination.   PA approved through 08/13/2015.  Reference # [id number]

## 2014-08-27 ENCOUNTER — Ambulatory Visit: Payer: PRIVATE HEALTH INSURANCE | Admitting: Family Medicine

## 2014-09-03 ENCOUNTER — Ambulatory Visit: Payer: PRIVATE HEALTH INSURANCE | Admitting: Family Medicine

## 2014-09-10 ENCOUNTER — Encounter: Payer: Self-pay | Admitting: Family Medicine

## 2014-09-10 ENCOUNTER — Ambulatory Visit (INDEPENDENT_AMBULATORY_CARE_PROVIDER_SITE_OTHER): Payer: PRIVATE HEALTH INSURANCE | Admitting: Family Medicine

## 2014-09-10 VITALS — BP 138/74 | HR 88 | Temp 98.2°F | Resp 16 | Ht 67.0 in | Wt 268.0 lb

## 2014-09-10 DIAGNOSIS — E785 Hyperlipidemia, unspecified: Secondary | ICD-10-CM

## 2014-09-10 DIAGNOSIS — R600 Localized edema: Secondary | ICD-10-CM

## 2014-09-10 DIAGNOSIS — R011 Cardiac murmur, unspecified: Secondary | ICD-10-CM

## 2014-09-10 DIAGNOSIS — E669 Obesity, unspecified: Secondary | ICD-10-CM

## 2014-09-10 DIAGNOSIS — E1143 Type 2 diabetes mellitus with diabetic autonomic (poly)neuropathy: Secondary | ICD-10-CM

## 2014-09-10 DIAGNOSIS — I10 Essential (primary) hypertension: Secondary | ICD-10-CM

## 2014-09-10 DIAGNOSIS — M170 Bilateral primary osteoarthritis of knee: Secondary | ICD-10-CM

## 2014-09-10 NOTE — Assessment & Plan Note (Signed)
Recheck A1C, continue meds Aide situation being rectified to assist with home needs

## 2014-09-10 NOTE — Assessment & Plan Note (Signed)
Leg swelling, heart murmur, obtain 2D Echo

## 2014-09-10 NOTE — Assessment & Plan Note (Signed)
BP looks okay no change to meds

## 2014-09-10 NOTE — Progress Notes (Signed)
Patient ID: Kevin [name], male   DOB: [birth year], 58 y.o.   MRN: [id number]   Subjective:    Patient ID: Kevin [name], male    DOB: [birth year], 58 y.o.   MRN: [id number]  Patient presents for 3 month F/U  patient here to follow-up chronic medical problems. He has no specific concerns. He still has difficulty with his nurse aide we are working on getting a resolution to this. He still being followed by [facility]. Diabetes mellitus he's not been checking his blood sugars states he is not having anyone to help him do this. He is taking his medications as they come in blister packing. He is taking his pain medicine as needed for his arthritis he is using his cane for ambulation.    Review Of Systems:  GEN- denies fatigue, fever, weight loss,weakness, recent illness HEENT- denies eye drainage, change in vision, nasal discharge, CVS- denies chest pain, palpitations RESP- denies SOB, cough, wheeze ABD- denies N/V, change in stools, abd pain GU- denies dysuria, hematuria, dribbling, incontinence MSK- + joint pain, muscle aches, injury Neuro- denies headache, dizziness, syncope, seizure activity       Objective:    BP 138/74 mmHg  Pulse 88  Temp(Src) 98.2 F (36.8 C) (Oral)  Resp 16  Ht 5\' 7"  (1.702 m)  Wt 268 lb (121.564 kg)  BMI 41.96 kg/m2 GEN- NAD, alert and oriented x3 HEENT- PERRL, EOMI, non injected sclera, pink conjunctiva, MMM, oropharynx clear Neck- Supple, noJVD CVS- RRR, 2/6 SEM RESP-CTAB EXT- pedal edema Skin- small sebaceous hyperplasia near left eye  Pulses- Radial 2+, DP- 1+          Assessment & Plan:      Problem List Items Addressed This Visit    Obesity   Hyperlipidemia   Relevant Orders      Lipid panel   Essential hypertension, benign   Diabetes mellitus with neurological manifestation - Primary   Relevant Orders      HM DIABETES FOOT EXAM (Completed)      CBC with Differential      Comprehensive metabolic panel      Hemoglobin A1c    Lipid panel      Note: This dictation was prepared with Dragon dictation along with smaller phrase technology. Any transcriptional errors that result from this process are unintentional.

## 2014-09-10 NOTE — Patient Instructions (Signed)
We will call on your [place]  Echo on your heart for the fluid pill We will call with lab results F/U 3 months

## 2014-09-11 LAB — COMPREHENSIVE METABOLIC PANEL
ALT: 50 U/L (ref 0–53)
AST: 62 U/L — ABNORMAL HIGH (ref 0–37)
Albumin: 3.8 g/dL (ref 3.5–5.2)
Alkaline Phosphatase: 73 U/L (ref 39–117)
BUN: 9 mg/dL (ref 6–23)
CO2: 29 mEq/L (ref 19–32)
Calcium: 10.1 mg/dL (ref 8.4–10.5)
Chloride: 100 mEq/L (ref 96–112)
Creat: 0.74 mg/dL (ref 0.50–1.35)
Glucose, Bld: 263 mg/dL — ABNORMAL HIGH (ref 70–99)
Potassium: 4.2 mEq/L (ref 3.5–5.3)
Sodium: 136 mEq/L (ref 135–145)
Total Bilirubin: 0.4 mg/dL (ref 0.2–1.2)
Total Protein: 7.1 g/dL (ref 6.0–8.3)

## 2014-09-11 LAB — CBC WITH DIFFERENTIAL/PLATELET
Basophils Absolute: 0.1 10*3/uL (ref 0.0–0.1)
Basophils Relative: 1 % (ref 0–1)
Eosinophils Absolute: 0.3 10*3/uL (ref 0.0–0.7)
Eosinophils Relative: 3 % (ref 0–5)
HCT: 40.8 % (ref 39.0–52.0)
Hemoglobin: 13.5 g/dL (ref 13.0–17.0)
Lymphocytes Relative: 24 % (ref 12–46)
Lymphs Abs: 2.3 10*3/uL (ref 0.7–4.0)
MCH: 29.9 pg (ref 26.0–34.0)
MCHC: 33.1 g/dL (ref 30.0–36.0)
MCV: 90.3 fL (ref 78.0–100.0)
MPV: 10.4 fL (ref 9.4–12.4)
Monocytes Absolute: 0.9 10*3/uL (ref 0.1–1.0)
Monocytes Relative: 9 % (ref 3–12)
Neutro Abs: 6 10*3/uL (ref 1.7–7.7)
Neutrophils Relative %: 63 % (ref 43–77)
Platelets: 204 10*3/uL (ref 150–400)
RBC: 4.52 MIL/uL (ref 4.22–5.81)
RDW: 15.6 % — ABNORMAL HIGH (ref 11.5–15.5)
WBC: 9.5 10*3/uL (ref 4.0–10.5)

## 2014-09-11 LAB — LIPID PANEL
Cholesterol: 132 mg/dL (ref 0–200)
HDL: 32 mg/dL — ABNORMAL LOW (ref >39)
LDL Cholesterol: 63 mg/dL (ref 0–99)
Total CHOL/HDL Ratio: 4.1 Ratio
Triglycerides: 186 mg/dL — ABNORMAL HIGH (ref <150)
VLDL: 37 mg/dL (ref 0–40)

## 2014-09-11 LAB — HEMOGLOBIN A1C
Hgb A1c MFr Bld: 10.2 % — ABNORMAL HIGH (ref <5.7)
Mean Plasma Glucose: 246 mg/dL — ABNORMAL HIGH (ref <117)

## 2014-09-15 ENCOUNTER — Telehealth: Payer: Self-pay | Admitting: *Deleted

## 2014-09-15 NOTE — Progress Notes (Signed)
On blister pack for 3 weeks, likley not taking before then as prescribed, will not make any changes at this time

## 2014-09-15 NOTE — Telephone Encounter (Signed)
Pt has appt on Thursday Dec 10 at [facility] for Echocardiogram at 10:30am, lmtrc on several numbers to return my call

## 2014-09-18 ENCOUNTER — Other Ambulatory Visit: Payer: Self-pay | Admitting: *Deleted

## 2014-09-18 ENCOUNTER — Ambulatory Visit (HOSPITAL_COMMUNITY): Payer: PRIVATE HEALTH INSURANCE | Attending: Family Medicine

## 2014-09-18 DIAGNOSIS — E119 Type 2 diabetes mellitus without complications: Secondary | ICD-10-CM

## 2014-11-12 ENCOUNTER — Ambulatory Visit: Payer: PRIVATE HEALTH INSURANCE | Admitting: Nutrition

## 2014-11-17 ENCOUNTER — Encounter: Payer: Self-pay | Admitting: Nutrition

## 2014-11-17 ENCOUNTER — Encounter: Payer: Medicare Other | Attending: Family Medicine | Admitting: Nutrition

## 2014-11-17 VITALS — Wt 248.0 lb

## 2014-11-17 DIAGNOSIS — E118 Type 2 diabetes mellitus with unspecified complications: Secondary | ICD-10-CM | POA: Insufficient documentation

## 2014-11-17 DIAGNOSIS — IMO0002 Reserved for concepts with insufficient information to code with codable children: Secondary | ICD-10-CM

## 2014-11-17 DIAGNOSIS — Z713 Dietary counseling and surveillance: Secondary | ICD-10-CM | POA: Diagnosis not present

## 2014-11-17 DIAGNOSIS — E1165 Type 2 diabetes mellitus with hyperglycemia: Secondary | ICD-10-CM

## 2014-11-17 NOTE — Progress Notes (Signed)
Medical Nutrition Therapy:  Appt start time: 1330 end time:  1430.  Assessment:  Primary concerns today: Diabetes. Here with his brother, Kevin [name]. He lives with [place]. Testing blood sugar: nurse comes daily to test blood sugar. Someone comes in and cooks meals for him also from [employer].[name] He is physically limited with walking. May have learning difficulties.   BS 138 mg/dl. He and his brother do the shopping and cooking. Eats twice a day. Tends to sleep in til 9-10 am. Willing to try and get up a little earlier. Complains of dry mouth and increased thirst.Has leg and knee problems that is painful and therefore he can't get out of bed sometimes. He has medicines are in a bubble pack. He is taking Januvia and Metformin. Most recent A1C is 10.2%. His TG were elevated at 186 md/dl and HDL low at 32.   He doesn't want to go on insulin if at all possible. Willing to make changes with his diet to improve blood sugars to see if that will help before thinking about insulin.   Diet reveals he eats a lot of sweets, ice cream, sodas, sweet tea and processed and junk food.      He may need insulin despite any changes in his diet as he has had DM over 10 years and probably doesn't have much pancreatic function left.       At high risk for cardiovascular problems due to obesity, hyperlipidemia, high blood sugars and HTN.  Preferred Learning Style:    Auditory  Visual  Hands on  Limited reading and comprehension ability.   Learning Readiness:    Ready  Change in progress   MEDICATIONS: See list   DIETARY INTAKE:  24-hr recall:  B ( AM): 2 sausage biscuits, Diet soda and sometimes orange juice. Likes to drink [employer]'s. Snk ( AM): none  L ( PM): hasn't eaten yet. : Yesterday: 2 sausage biscuits Snk ( PM):  D ( PM): Hungry man TV dinner=turkey, mashed potatoes,gravy and green beans. Diet Soda Snk ( PM):  Ice cream, apple Beverages: water, diet sodas, juice, yahoos, chocolate milk and milk(  skim)  Usual physical activity: not able to walk much: walks with cane  Estimated energy needs: 1800 calories 200 g carbohydrates 135 g protein 50 g fat  Progress Towards Goal(s):  In progress.   Nutritional Diagnosis:  NB-1.1 Food and nutrition-related knowledge deficit As related to Diabetes.  As evidenced by A1C 10.4%..    Intervention:  Nutrition counseling and diabetes education provided on the Plate Method, food choices to have per meal, portion sizes, target ranges for blood sugars, benefits of improving food choices, need to avoid high processed and high fat foods and benefits of weight loss. Stressed need to avoid junk food, cakes, cookies, pies, sweets, sodas, tea and juices.  Goals:  Follow Diabetes Meal Plan as instructed  Eat 3 meals per day.   Try to get up and eat breakfast at 9 am, lunch at 1-2 pm and dinner 6-7 pm.  Increase water intake  Choose Healthy CHoice or Smart Ones for TV dinners instead of Hungry Man  Eat 3 carb choices per meal  Avoid snacks between meals  Cut out ice cream and eat a piece of fruit if needed after supper.  Increase water intake and cut out diet sodas, juices, sweet tea and Yahoos/chocolate milk  Add lean protein foods to meals  Test blood sugar before breakfast daily and record on sheet and bring to all visits.  Get A1C down to 8% or lower in three months.   Teaching Method Utilized:  Visual Auditory Hands on  Handouts given during visit include: The Plate Method Meal Plan [name]      Low Literacy Diabetes Instructions.  Barriers to learning/adherence to lifestyle change: none  Demonstrated degree of understanding via:  Teach Back   Monitoring/Evaluation:  Dietary intake, exercise, meal planning, SBG, and body weight in 1 month(s).

## 2014-11-17 NOTE — Patient Instructions (Signed)
  Goals:  Follow Diabetes Meal Plan as instructed  Eat 3 meals per day.   Try to get up and eat breakfast at 9 am, lunch at 1-2 pm and dinner 6-7 pm.  Increase water intake  Eat 3 carb choices per meal  Avoid snacks between meals  Cut out ice cream and eat a piece of fruit if needed after supper.  Increase water intake and cut out diet sodas, juices, sweet tea and Yahoos/chocolate milk  Choose Healthy Choice or Smart Ones for TV dinners instead of Hungry man.  Add lean protein foods to meals  Test blood sugar before breakfast daily and record on sheet and bring to all visits.  Get A1C down to 8% or lower in three months.

## 2014-11-19 ENCOUNTER — Other Ambulatory Visit: Payer: Self-pay | Admitting: *Deleted

## 2014-11-19 ENCOUNTER — Ambulatory Visit (INDEPENDENT_AMBULATORY_CARE_PROVIDER_SITE_OTHER): Payer: Medicare Other | Admitting: Family Medicine

## 2014-11-19 ENCOUNTER — Encounter: Payer: Self-pay | Admitting: Family Medicine

## 2014-11-19 VITALS — BP 136/70 | HR 82 | Temp 98.7°F | Resp 18 | Ht 67.0 in | Wt 251.0 lb

## 2014-11-19 DIAGNOSIS — E0840 Diabetes mellitus due to underlying condition with diabetic neuropathy, unspecified: Secondary | ICD-10-CM

## 2014-11-19 DIAGNOSIS — E0843 Diabetes mellitus due to underlying condition with diabetic autonomic (poly)neuropathy: Secondary | ICD-10-CM

## 2014-11-19 DIAGNOSIS — R21 Rash and other nonspecific skin eruption: Secondary | ICD-10-CM | POA: Diagnosis not present

## 2014-11-19 MED ORDER — BLOOD GLUCOSE MONITOR KIT: PACK | Status: DC

## 2014-11-19 MED ORDER — CLOTRIMAZOLE-BETAMETHASONE 1-0.05 % EX CREA: 1.0000 "application " | TOPICAL_CREAM | Freq: Two times a day (BID) | CUTANEOUS | Status: DC

## 2014-11-19 NOTE — Patient Instructions (Signed)
New diabetic shoes Get new diabetes meter Cream for the rash, use twice a day  F/U in March

## 2014-11-19 NOTE — Progress Notes (Signed)
Patient ID: Kevin [name], male   DOB: 05-09-1956, 59 y.o.   MRN: [id number]   Subjective:    Patient ID: Kevin [name], male    DOB: [birth year], 59 y.o.   MRN: [id number]  Patient presents for Rash and Diabetic Foot Exam  Here with a rash on his bilateral sides. He noticed some red bumps which are not pruritic or painful in any manner over the past couple weeks. He told his [facility] nurse then schedule an appointment. He also states that he has a brown rash beneath his right axilla he is not sure how long this has been there either.  He is due for diabetic foot exam as well as a new P diabetic shoes also request a new diabetic meter   Review Of Systems:  GEN- denies fatigue, fever, weight loss,weakness, recent illness HEENT- denies eye drainage, change in vision, nasal discharge, CVS- denies chest pain, palpitations RESP- denies SOB, cough, wheeze ABD- denies N/V, change in stools, abd pain GU- denies dysuria, hematuria, dribbling, incontinence MSK- +joint pain, muscle aches, injury Neuro- denies headache, dizziness, syncope, seizure activity       Objective:    BP 136/70 mmHg  Pulse 82  Temp(Src) 98.7 F (37.1 C) (Oral)  Resp 18  Ht 5\' 7"  (1.702 m)  Wt 251 lb (113.853 kg)  BMI 39.30 kg/m2 GEN- NAD, alert and oriented x3 Skin- few fine erythematous maculopapular lesions scattered, cherry angiomas, few moles, right axilla, raised scaley circular hyperpigented plaque with a few small satellite lesions EXT- No edema Pulses- Radial, DP- 2+        Assessment & Plan:      Problem List Items Addressed This Visit    None    Visit Diagnoses    Rash and nonspecific skin eruption    -  Primary    the fine red areas are very non specific, I am not concerned over these and no symptoms, hyperpigmented areas concerning for tinea dermatitis, trial of lotrison       Note: This dictation was prepared with Dragon dictation along with smaller phrase technology. Any  transcriptional errors that result from this process are unintentional.

## 2014-11-19 NOTE — Assessment & Plan Note (Signed)
Diabetic shoes, and testing strips ordered Note being seen by dietician as well

## 2014-12-04 DIAGNOSIS — E1151 Type 2 diabetes mellitus with diabetic peripheral angiopathy without gangrene: Secondary | ICD-10-CM | POA: Diagnosis not present

## 2014-12-04 DIAGNOSIS — B351 Tinea unguium: Secondary | ICD-10-CM | POA: Diagnosis not present

## 2014-12-04 DIAGNOSIS — L84 Corns and callosities: Secondary | ICD-10-CM | POA: Diagnosis not present

## 2014-12-17 ENCOUNTER — Ambulatory Visit: Payer: PRIVATE HEALTH INSURANCE | Admitting: Family Medicine

## 2014-12-22 ENCOUNTER — Ambulatory Visit: Payer: Medicare Other | Admitting: Family Medicine

## 2014-12-25 ENCOUNTER — Encounter: Payer: Medicare Other | Attending: "Endocrinology | Admitting: Nutrition

## 2014-12-25 VITALS — Wt 248.0 lb

## 2014-12-25 DIAGNOSIS — Z6838 Body mass index (BMI) 38.0-38.9, adult: Secondary | ICD-10-CM | POA: Diagnosis not present

## 2014-12-25 DIAGNOSIS — IMO0002 Reserved for concepts with insufficient information to code with codable children: Secondary | ICD-10-CM

## 2014-12-25 DIAGNOSIS — Z713 Dietary counseling and surveillance: Secondary | ICD-10-CM | POA: Insufficient documentation

## 2014-12-25 DIAGNOSIS — E669 Obesity, unspecified: Secondary | ICD-10-CM

## 2014-12-25 DIAGNOSIS — E1165 Type 2 diabetes mellitus with hyperglycemia: Secondary | ICD-10-CM

## 2014-12-25 DIAGNOSIS — E118 Type 2 diabetes mellitus with unspecified complications: Secondary | ICD-10-CM | POA: Insufficient documentation

## 2014-12-25 NOTE — Progress Notes (Signed)
  Medical Nutrition Therapy:  Appt start time: 1330 end time:  1400.  Assessment:  Primary concerns today: Diabetes. Here with his brother, [name]. Drinking more water. Has cut out hot dogs. Eating Cherrios with skim milk for breakfast. Not as thirsty or urniating as much. Sleeping better. Feels better. FBS 160's now instead of 260's.  HE cut out chocolate milk, juices, sodas and tea.      Taking Metformin 1000 mg BID and Januvia 100 mg daily. Pills are put in bubble packs and he is compliant with those. Now eating Healthy Choice meals instead of Hungry Man and eating less fried and processed foods.  Preferred Learning Style:    Auditory  Visual  Hands on  Limited reading and comprehension ability.   Learning Readiness:    Ready  Change in progress   MEDICATIONS: See list   DIETARY INTAKE:  24-hr recall:  B ( AM): Cherrios, Skim milk,  1/2 banana Snk ( AM): none  L ( PM): Fish, french fries, hush puppies, water Snk ( PM):  D ( PM): Healthy Choice Meal, Water Snk ( PM):    Beverages: water, Usual physical activity: walks with cane  Estimated energy needs: 1800 calories 200 g carbohydrates 135 g protein 50 g fat  Progress Towards Goal(s):  In progress.   Nutritional Diagnosis:  NB-1.1 Food and nutrition-related knowledge deficit As related to Diabetes.  As evidenced by A1C 10.4%..    Intervention:  Nutrition counseling and diabetes education provided on the Plate Method, food choices to have per meal, portion sizes, target ranges for blood sugars, benefits of improving food choices, need to avoid high processed and high fat foods and benefits of weight loss. Stressed need to avoid junk food, cakes, cookies, pies, sweets, sodas, tea and juices.  Goals: Keep up the good job!!  Follow Diabetes Meal Plan as instructed  Eat 3 meals per day.  Don't skip meals.  Avoid sugar free products other than sugar free jello.  No sugar free ice cream, cakes, cookies or  pies.       Increase fresh fruits and vegetables. Continue to drink water. Increase walking as tolerated.  Lose 4 lbs in 2 months.        Get A1C down to 8% or lower in three months.   Teaching Method Utilized:  Visual Auditory Hands on  Handouts given during visit include: The Plate Method Meal Plan Card      Low Literacy Diabetes Instructions.  Barriers to learning/adherence to lifestyle change: none  Demonstrated degree of understanding via:  Teach Back   Monitoring/Evaluation:  Dietary intake, exercise, meal planning, SBG, and body weight in 2 month(s).

## 2014-12-25 NOTE — Patient Instructions (Signed)
Goals: Keep up the good job!!  Follow Diabetes Meal Plan as instructed  Eat 3 meals per day.  Don't skip meals.  Avoid sugar free products other than sugar free jello.  No sugar free ice cream, cakes, cookies or pies.       Increase fresh fruits and vegetables. Continue to drink water. Increase walking as tolerated.  Lose 4 lbs in 2 months.        Get A1C down to 8% or lower in three months.

## 2014-12-31 ENCOUNTER — Other Ambulatory Visit: Payer: Self-pay | Admitting: Licensed Clinical Social Worker

## 2014-12-31 NOTE — Patient Outreach (Signed)
[facility] Social Work  12/31/2014   Kevin [name] March 16, 1956 [id number]  Subjective:  Objective:  Current Medications: Current Outpatient Prescriptions  Medication Sig Dispense Refill  . amLODipine (NORVASC) 5 MG tablet Take 1 tablet (5 mg total) by mouth daily. For blood pressure 30 tablet 6  . blood glucose meter kit and supplies KIT Dispense based on patient and insurance preference. Use to monitor FSBS 1 daily. Dx: E11.8 1 each 0  . cetirizine (ZYRTEC) 10 MG tablet Take 1 tablet (10 mg total) by mouth daily. For allergies 30 tablet 6  . clotrimazole-betamethasone (LOTRISONE) cream Apply 1 application topically 2 (two) times daily. 30 g 6  . diclofenac (VOLTAREN) 75 MG EC tablet TAKE (1) TABLET BY MOUTH TWICE DAILY FOR INFLAMMATION. 60 tablet 6  . diclofenac sodium (VOLTAREN) 1 % GEL Apply to knee three times a day as needed 1 Tube 6  . FLUoxetine (PROZAC) 20 MG capsule Take 1 capsule (20 mg total) by mouth daily. For your nerves 30 capsule 6  . folic acid (FOLVITE) 1 MG tablet Take 1 tablet (1 mg total) by mouth daily. 30 tablet 6  . furosemide (LASIX) 40 MG tablet Take 1 tablet (40 mg total) by mouth 2 (two) times daily. For fluid 60 tablet 6  . HYDROcodone-acetaminophen (NORCO/VICODIN) 5-325 MG per tablet Take 1 tablet by mouth 2 (two) times daily as needed. 60 tablet 0  . metFORMIN (GLUCOPHAGE) 1000 MG tablet Take 1 tablet (1,000 mg total) by mouth 2 (two) times daily with a meal. For diabetes 60 tablet 6  . nabumetone (RELAFEN) 750 MG tablet Take 1 tablet (750 mg total) by mouth 2 (two) times daily. 60 tablet 6  . nitroGLYCERIN (NITROSTAT) 0.4 MG SL tablet Place 1 tablet under the tongue every 5 minutes as needed for chest pain x3. If not resolved after 3, go to ER. 30 tablet 6  . potassium chloride (K-DUR) 10 MEQ tablet Take 1 tablet (10 mEq total) by mouth 2 (two) times daily. Take with potassium 60 tablet 6  . simvastatin (ZOCOR) 20 MG tablet Take 1 tablet (20 mg total) by  mouth every evening. For cholesterol 30 tablet 6  . sitaGLIPtin (JANUVIA) 100 MG tablet Take 1 tablet (100 mg total) by mouth daily. 30 tablet 6   No current facility-administered medications for this visit.    Functional Status: No flowsheet data found.  Fall/Depression Screening: PHQ 2/9 Scores 11/17/2014 02/17/2014  PHQ - 2 Score 0 2  PHQ- 9 Score - 6   Assessment:  CSW met with client on 12/31/14 at home of client for a routine home visit. Client said he was having periodic throbbing pain in right knee and in bottom of right foot.  He said he takes pain medication as prescribed. He said he is scheduled to have appointment with Dr. Buelah Manis on 01/12/15 at [facility]. He said his finances were going well and he had been able to pay monthly bills on time.  He said he uses transport support from [employer] and from [place].  He said he has prescribed medications. He said he has adequate food supply. He said he does receive some support from local food pantry occasionally.  He uses a cane to ambulate. He said he has to go slowly when climbing steps or coming down steps.  He said he can dress independently and take shower independently.  He sees [name], nutritionist, for healthy diet/healthy  eating education and support guidelines.   Client said he has help, as scheduled, from home health aide through [facility] agency.  Client said his home health aide assists him regularly, as scheduled, in the home. Client said he is well pleased with home health aide support from [facility] agency.  Client said he had appointment with podiatrist about 4 weeks ago.  Client reports to CSW that his sister, Kevin [name] ([phone]) is his health care power of attorney. Kevin [name] does help client with health care decisions. However, CSW has never seen an official advanced directive document for client.  Kevin [name] is his next of kin  and helps client in making health care decisions.  Plan: Client to take medications as prescribed and to attend scheduled medical appointments. CSW to call client in two weeks to assess needs of client at that time. CSW communicated above information to RN Janalyn Shy on 12/31/14.   Norva Riffle.Evarose Altland MSW, LCSW Licensed Clinical Social Worker [facility] Care Management [phone]

## 2014-12-31 NOTE — Patient Instructions (Signed)
CSW encouraged client to call [facility] [employer] transport agency next week to make arrangements for that agency to help transport client to and from 01/12/15 client appointment with Dr. Buelah Manis, primary doctor. Client has Medicaid benefit.

## 2015-01-01 ENCOUNTER — Other Ambulatory Visit: Payer: Self-pay | Admitting: Family Medicine

## 2015-01-01 NOTE — Telephone Encounter (Signed)
Test strips refilled

## 2015-01-05 ENCOUNTER — Other Ambulatory Visit: Payer: Self-pay | Admitting: *Deleted

## 2015-01-05 NOTE — Telephone Encounter (Signed)
Spoke with [facility] nurse.   Reports that new documentation is being added to EPIC from charts. States that she spoke with patient and was advised that he is doing well and has no increased edema that he is concerned about. States that she will see patient this week to verify .

## 2015-01-05 NOTE — Telephone Encounter (Signed)
What is this message in reference to the dates previously are from July 2015?

## 2015-01-05 NOTE — Telephone Encounter (Signed)
I will call Kevin [name] today for follow up and will schedule face to face appointment with him for this week.    [facility]  [address] [place], [place] [address] [email or web address]   [phone] (mobile)  [phone] (main office) [phone] (fax)

## 2015-01-08 ENCOUNTER — Encounter: Payer: Self-pay | Admitting: *Deleted

## 2015-01-08 ENCOUNTER — Other Ambulatory Visit: Payer: Self-pay | Admitting: *Deleted

## 2015-01-08 NOTE — Patient Outreach (Signed)
Kevin [name] [facility]) Care Management   01/08/2015  Kevin [name] [birth year] [id number]  Kevin [name] is an 59 y.o. male  Subjective: "I'm doing good. I'm eating Cheerios and drinking water. My sugar was 166."  Objective:    BP 108/70 mmHg  Pulse 78  SpO2 98%  Review of Systems  Constitutional: Negative.   HENT: Negative.   Eyes: Negative.   Respiratory: Negative.   Cardiovascular: Negative.   Gastrointestinal: Negative.   Genitourinary: Negative.   Musculoskeletal: Positive for joint pain.  Skin: Negative.   Neurological: Negative.   Psychiatric/Behavioral: Negative.     Physical Exam  Constitutional: He is oriented to person, place, and time. Vital signs are normal. He appears well-developed and well-nourished. He is active.  Cardiovascular: Normal rate, regular rhythm and normal heart sounds.   Respiratory: Effort normal. He has decreased breath sounds in the right lower field and the left lower field.  GI: Soft. Normal appearance and bowel sounds are normal.  Neurological: He is alert and oriented to person, place, and time.  Skin: Skin is warm and dry.  Psychiatric: He has a normal mood and affect. His behavior is normal. Judgment and thought content normal. His speech is slurred. Cognition and memory are normal.  Kevin [name] speech is always somewhat difficult to understand. His usual speech pattern is slightly slurred and difficult to understand at times. This pattern is unchanged today.    Current Medications:   Current Outpatient Prescriptions  Medication Sig Dispense Refill  . ACCU-CHEK AVIVA PLUS test strip USE TO CHECK BLOOD SUGAR ONCE DAILY. 50 each 10  . amLODipine (NORVASC) 5 MG tablet Take 1 tablet (5 mg total) by mouth daily. For blood pressure 30 tablet 6  . blood glucose meter kit and supplies KIT Dispense based on patient and insurance preference. Use to monitor FSBS 1 daily. Dx: E11.8 1 each 0  . cetirizine (ZYRTEC) 10 MG tablet  Take 1 tablet (10 mg total) by mouth daily. For allergies 30 tablet 6  . clotrimazole-betamethasone (LOTRISONE) cream Apply 1 application topically 2 (two) times daily. 30 g 6  . diclofenac (VOLTAREN) 75 MG EC tablet TAKE (1) TABLET BY MOUTH TWICE DAILY FOR INFLAMMATION. 60 tablet 6  . diclofenac sodium (VOLTAREN) 1 % GEL Apply to knee three times a day as needed 1 Tube 6  . FLUoxetine (PROZAC) 20 MG capsule Take 1 capsule (20 mg total) by mouth daily. For your nerves 30 capsule 6  . folic acid (FOLVITE) 1 MG tablet Take 1 tablet (1 mg total) by mouth daily. 30 tablet 6  . furosemide (LASIX) 40 MG tablet Take 1 tablet (40 mg total) by mouth 2 (two) times daily. For fluid 60 tablet 6  . HYDROcodone-acetaminophen (NORCO/VICODIN) 5-325 MG per tablet Take 1 tablet by mouth 2 (two) times daily as needed. 60 tablet 0  . metFORMIN (GLUCOPHAGE) 1000 MG tablet Take 1 tablet (1,000 mg total) by mouth 2 (two) times daily with a meal. For diabetes 60 tablet 6  . nabumetone (RELAFEN) 750 MG tablet Take 1 tablet (750 mg total) by mouth 2 (two) times daily. 60 tablet 6  . nitroGLYCERIN (NITROSTAT) 0.4 MG SL tablet Place 1 tablet under the tongue every 5 minutes as needed for chest pain x3. If not resolved after 3, go to ER. 30 tablet 6  . potassium chloride (K-DUR) 10 MEQ tablet Take 1 tablet (10 mEq total) by mouth 2 (two) times daily. Take with potassium 60 tablet 6  .  simvastatin (ZOCOR) 20 MG tablet Take 1 tablet (20 mg total) by mouth every evening. For cholesterol 30 tablet 6  . sitaGLIPtin (JANUVIA) 100 MG tablet Take 1 tablet (100 mg total) by mouth daily. 30 tablet 6   No current facility-administered medications for this visit.    Functional Status:   In your present state of health, do you have any difficulty performing the following activities: 12/31/2014  Is the patient deaf or have difficulty hearing? N  Hearing N  Vision N  Difficulty concentrating or making decisions Y  Walking or climbing  stairs? N  Doing errands, shopping? N  Preparing Food and eating ? Y  Using the Toilet? N  In the past six months, have you accidently leaked urine? N  Do you have problems with loss of bowel control? N  Managing your Medications? N  Managing your Finances? N  Housekeeping or managing your Housekeeping? N    Fall/Depression Screening:    PHQ 2/9 Scores 11/17/2014 02/17/2014  PHQ - 2 Score 0 2  PHQ- 9 Score - 6    Assessment:    Chronic Health Condition (DM) -  Kevin [name] HgA1C was 10.2 (up 3 points from 4 months ago when it was 7.8);    Medication adherence - In January, Kevin [name] agreed to transition to [facility] for prescriptions so that his medications would be pre-filled in blister packs; his adherence has improved but he still is only taking medications about 50% of the time; granted - this is a 100% improvement from early last year when [name] was not taking medications at all.    Nutrition/Diet - [name] struggles here greatly; he lives with his step-brother who prepares most meals; my observation has been that Kevin [name] and his brother eat a very highly processed, high fat diet; Kevin [name] and his brother are severely lacking in knowledge about nutrition; we have spent many hours discussing nutrition; Kevin [name] was referred to Ms. [name] RD, CDE [facility] and is finally making dietary changes that resulted in the first fasting cbg < 200 Kevin [name] has had in the 2 years I've known him. His fasting CBG today was 166!   Kevin [name] explained that he has been eating Cheerios and 1/2 cup skim milk for breakfast each day and for supper he is eating grilled or roasted chicken and frozen vegetables. Kevin [name] says he often eats cereal for lunch and snacks. His step brother reported that Kevin [name] has eaten 7 family size boxes of Cheerios in the last month.   Per his report, Kevin [name] has given up all beverages  for water and skim milk. Kevin [name] says he is drinking bottled water "all day long" and feels this has made him feel better. He says he is quite enjoying this change. Most notable to me today, besides his drastically improved cbg, was how clear Kevin [name] eyes and skin were today. His cheeks were rosy and he said he felt more alert and thought it was because of the water and the improved blood sugar control.   I reviewed the basics of carb modified diet today with particular attention to portion control (see note above about Cheerios).    Smoking - Mr. Schellenberg continues to smoke; he is vague about how much he smokes; I have noted that the home smells much less like smoke than it has in months past. Mr. Cessna says he is still working on  cutting back but declines pharmacological intervention at this time.    Exercise - Mr. Fredin is limited by arthralgias, abnormal gait/balance, and generalized deconditioning. Mr. Winer walks short distances with a cane. He travels by wheelchair when away from home.    Diabetic Shoes - Mr. Flaum went to [facility] for a diabetic shoe fitting a few months ago. He said he was turned away because he needed a new prescription. I notified Dr. Dorian Heckle office and the prescription was sent. I advised Mr. Trickel to call [facility] to check and see if they received it and to see if he can schedule an appointment. I advised him to call me if he has difficulty.    Plan:   [place]        Patient Outreach from 01/08/2015 in [place] Problem Two  poor blood sugar control   Care Plan for Problem Two  Active   [facility] Long Term Goal (31-90) days  patient will note improvement in cbg's and HgA1C over the next 90 days as evidenced by lab data/documentation [facility] 09/11/2015 = 10.2]   [facility] Long Term Goal Start Date  12/23/14   [facility] CM Short Term Goal #1 (0-30 days)  patient will see RD, CDE as scheduled at the  [facility] 12/25/14 oas evidenced by RD documentation in EPIC   [facility] CM Short Term Goal #1 Start Date  12/23/14   [facility] CM Short Term Goal #2 (0-30 days)  patient will take medications as prescribed from blister packs at least 50% of the time over the next 30 days as evidenced by pill count   [facility] CM Short Term Goal #2 Start Date  12/23/14   [facility] CM Short Term Goal #3 (0-30 days)  patient will verbalize basic understanding of plate method of nutrition management plan as outlined by RD, CDE over the next 30 days   [facility] CM Short Term Goal #3 Start Date  12/23/14       Janalyn Shy [facility]  [phone]

## 2015-01-12 ENCOUNTER — Ambulatory Visit: Payer: Medicare Other | Admitting: Family Medicine

## 2015-01-27 ENCOUNTER — Ambulatory Visit: Payer: Medicare Other | Admitting: *Deleted

## 2015-01-28 ENCOUNTER — Ambulatory Visit (INDEPENDENT_AMBULATORY_CARE_PROVIDER_SITE_OTHER): Payer: Medicare Other | Admitting: Family Medicine

## 2015-01-28 ENCOUNTER — Encounter: Payer: Self-pay | Admitting: Family Medicine

## 2015-01-28 VITALS — BP 132/76 | HR 68 | Temp 97.3°F | Resp 14 | Ht 67.0 in | Wt 245.0 lb

## 2015-01-28 DIAGNOSIS — Z550 Illiteracy and low-level literacy: Secondary | ICD-10-CM

## 2015-01-28 DIAGNOSIS — E669 Obesity, unspecified: Secondary | ICD-10-CM

## 2015-01-28 DIAGNOSIS — I10 Essential (primary) hypertension: Secondary | ICD-10-CM | POA: Diagnosis not present

## 2015-01-28 DIAGNOSIS — E0840 Diabetes mellitus due to underlying condition with diabetic neuropathy, unspecified: Secondary | ICD-10-CM

## 2015-01-28 DIAGNOSIS — E785 Hyperlipidemia, unspecified: Secondary | ICD-10-CM | POA: Diagnosis not present

## 2015-01-28 DIAGNOSIS — E0843 Diabetes mellitus due to underlying condition with diabetic autonomic (poly)neuropathy: Secondary | ICD-10-CM | POA: Diagnosis not present

## 2015-01-28 DIAGNOSIS — M170 Bilateral primary osteoarthritis of knee: Secondary | ICD-10-CM | POA: Diagnosis not present

## 2015-01-28 MED ORDER — HYDROCODONE-ACETAMINOPHEN 5-325 MG PO TABS: 1.0000 | ORAL_TABLET | Freq: Two times a day (BID) | ORAL | Status: DC | PRN

## 2015-01-28 NOTE — Assessment & Plan Note (Signed)
He has diabetic shoes. We are working to get his A1c down I'm rechecking this today. If his blood glucose improves his neuropathy should also improve Weight loss will also help with his neuropathy

## 2015-01-28 NOTE — Assessment & Plan Note (Signed)
Blood pressure well controlled

## 2015-01-28 NOTE — Assessment & Plan Note (Signed)
Uncontrolled diabetes his goals and A1c less than 7%. He's been following with the nutritionist in his fasting blood sugars have improved significantly. I'm going to recheck his A1c today hopefully he is closer to that 7%. He is currently on metformin and Januvia his meds are being package now

## 2015-01-28 NOTE — Progress Notes (Signed)
Patient ID: Kevin [name], male   DOB: 09/24/1956, 59 y.o.   MRN: [id number]   Subjective:    Patient ID: Kevin [name], male    DOB: 1956/03/10, 59 y.o.   MRN: [id number]  Patient presents for F/U  A she had a follow chronic medical problems. He states that he's been doing fairly well. He has been following with the nutritionist and his blood sugars have come down his blood sugars have been in the 140s now. He continues to have chronic pain in his legs and his shoulders which she does use hydrocodone for as needed. He also has anti-inflammatory at home. He is being [facility] which he has improved significantly since being under their care. He has his new diabetic shoes. He also has appointment with the foot doctor coming up. He was also recently seen by the eye doctor.  He still has not had the sleep apnea test done but states that he sleeps well and does not have any problems with this at this time.  He also showed me a small bruise A he has on his left upper arm he is not sure where it came from but it is now getting better there is no pain. He does not remember any particular injury   Review Of Systems:  GEN- denies fatigue, fever, weight loss,weakness, recent illness HEENT- denies eye drainage, change in vision, nasal discharge, CVS- denies chest pain, palpitations RESP- denies SOB, cough, wheeze ABD- denies N/V, change in stools, abd pain GU- denies dysuria, hematuria, dribbling, incontinence MSK- denies joint pain, muscle aches, injury Neuro- denies headache, dizziness, syncope, seizure activity       Objective:    BP 132/76 mmHg  Pulse 68  Temp(Src) 97.3 F (36.3 C) (Oral)  Resp 14  Ht 5\' 7"  (1.702 m)  Wt 245 lb (111.131 kg)  BMI 38.36 kg/m2 GEN- NAD, alert and oriented x3 HEENT- PERRL, EOMI, non injected sclera, pink conjunctiva, MMM, oropharynx clear Neck- Supple,  CVS- RRR, 2/6 SEM RESP-CTAB EXT-No edema MSK- Decreased ROM  upper and LE and neck Skin- nickle size yellow burising on UE, no palpable hematoma Pulses- Radial 2+, DP- 1+     Assessment & Plan:      Problem List Items Addressed This Visit    Obesity   Hyperlipidemia   Relevant Orders   Lipid panel   Essential hypertension, benign - Primary   Relevant Orders   CBC with Differential/Platelet   Comprehensive metabolic panel   Diabetes mellitus with neurological manifestation   Relevant Orders   Hemoglobin A1c   Microalbumin / creatinine urine ratio      Note: This dictation was prepared with Dragon dictation along with smaller phrase technology. Any transcriptional errors that result from this process are unintentional.

## 2015-01-28 NOTE — Patient Instructions (Addendum)
Continue current medications We will call with lab results Pain medication refilled- prescription needs to go to pharmacy Take the form to foot doctor F/U 4 months

## 2015-01-28 NOTE — Assessment & Plan Note (Signed)
Osteoarthritis of bilateral knees he also has arthritis in her shoulders. I've refill to his hydrocodone. He will also continue the diclofenac anti-inflammatory

## 2015-01-29 LAB — CBC WITH DIFFERENTIAL/PLATELET
Basophils Absolute: 0.1 10*3/uL (ref 0.0–0.1)
Basophils Relative: 1 % (ref 0–1)
Eosinophils Absolute: 0.5 10*3/uL (ref 0.0–0.7)
Eosinophils Relative: 4 % (ref 0–5)
HCT: 43.8 % (ref 39.0–52.0)
Hemoglobin: 15 g/dL (ref 13.0–17.0)
Lymphocytes Relative: 22 % (ref 12–46)
Lymphs Abs: 2.5 10*3/uL (ref 0.7–4.0)
MCH: 31.5 pg (ref 26.0–34.0)
MCHC: 34.2 g/dL (ref 30.0–36.0)
MCV: 92 fL (ref 78.0–100.0)
MPV: 10.7 fL (ref 8.6–12.4)
Monocytes Absolute: 1 10*3/uL (ref 0.1–1.0)
Monocytes Relative: 9 % (ref 3–12)
Neutro Abs: 7.3 10*3/uL (ref 1.7–7.7)
Neutrophils Relative %: 64 % (ref 43–77)
Platelets: 210 10*3/uL (ref 150–400)
RBC: 4.76 MIL/uL (ref 4.22–5.81)
RDW: 13.2 % (ref 11.5–15.5)
WBC: 11.4 10*3/uL — ABNORMAL HIGH (ref 4.0–10.5)

## 2015-01-29 LAB — COMPREHENSIVE METABOLIC PANEL
ALT: 31 U/L (ref 0–53)
AST: 34 U/L (ref 0–37)
Albumin: 3.9 g/dL (ref 3.5–5.2)
Alkaline Phosphatase: 60 U/L (ref 39–117)
BUN: 9 mg/dL (ref 6–23)
CO2: 27 mEq/L (ref 19–32)
Calcium: 8.7 mg/dL (ref 8.4–10.5)
Chloride: 104 mEq/L (ref 96–112)
Creat: 0.68 mg/dL (ref 0.50–1.35)
Glucose, Bld: 140 mg/dL — ABNORMAL HIGH (ref 70–99)
Potassium: 4.2 mEq/L (ref 3.5–5.3)
Sodium: 138 mEq/L (ref 135–145)
Total Bilirubin: 0.4 mg/dL (ref 0.2–1.2)
Total Protein: 6.9 g/dL (ref 6.0–8.3)

## 2015-01-29 LAB — LIPID PANEL
Cholesterol: 175 mg/dL (ref 0–200)
HDL: 34 mg/dL — ABNORMAL LOW (ref >=40)
LDL Cholesterol: 108 mg/dL — ABNORMAL HIGH (ref 0–99)
Total CHOL/HDL Ratio: 5.1 Ratio
Triglycerides: 166 mg/dL — ABNORMAL HIGH (ref <150)
VLDL: 33 mg/dL (ref 0–40)

## 2015-01-29 LAB — HEMOGLOBIN A1C
Hgb A1c MFr Bld: 10 % — ABNORMAL HIGH (ref <5.7)
Mean Plasma Glucose: 240 mg/dL — ABNORMAL HIGH (ref <117)

## 2015-02-02 ENCOUNTER — Encounter: Payer: Self-pay | Admitting: *Deleted

## 2015-02-02 ENCOUNTER — Other Ambulatory Visit: Payer: Self-pay | Admitting: *Deleted

## 2015-02-02 MED ORDER — INSULIN GLARGINE 300 UNIT/ML ~~LOC~~ SOPN: 10.0000 [IU] | PEN_INJECTOR | Freq: Every day | SUBCUTANEOUS | Status: DC

## 2015-02-02 MED ORDER — INSULIN PEN NEEDLE 29G X 12.7MM MISC: Status: DC

## 2015-02-03 ENCOUNTER — Other Ambulatory Visit: Payer: Self-pay | Admitting: *Deleted

## 2015-02-04 NOTE — Patient Outreach (Signed)
[facility]) Care Management  02/04/2015  [name] 17-Nov-1955 [id number]   I made a scheduled visit with Mr. Kevin [name] today to work with him on insulin administration. When I arrived today, Mr. Kevin [name] told me he declined delivery of his prescriptions because he didn't get paid until Tuesday. I notified Mr. Kevin [name] of Mr. Kevin [name] report of inability to pay for medications as he has been working with Mr. Kevin [name] long term on social issues including finances.   Kevin [name], his step brother Kevin [name], and his Kevin [name] and I had a long discussion about carb modified diet and adherence to prescribed medication regimen. We reviewed the print materials provided by dietician Ms. Kevin [name] and I addressed Mr. Kevin [name] questions. I believe his diet has significantly improved but he still is lacking in depth understanding of dietary requirements.   Mr. Kevin [name] is also still not taking all medications as prescribed. We had all his prescriptions moved to [facility] so he could have the benefit of pre-filled blister packs for easier medication adherence. Unfortunately, Mr. Kevin [name] keeps his blister packs in a drawer in the kitchen. I've worked with him on numerous visits about finding a place to keep his medications where he can see them and be reminded to take them but he has declined thus far.   Notably, Mr Kevin [name] last 7 cbg readings are less than 179. After his appointment with Dr. Buelah [name], Mr. Kevin [name] said he "tried harder" on his diet because he hoped she would change her mind about insulin. I discussed rationale for use of insulin in treatment regimen.   I encouraged Mr. Kevin [name] to accept delivery of insulin on Tuesday so that we can review proper administration during my home visit. I will call Mr. Kevin [name] on Wednesday to assure receipt of insulin delivery prior to making a visit.   Mr. Kevin [name] also requested assistance with transportation to his podiatry appointment next  week and I forwarded this request to Kevin [name] CMA/Transportation Coordinator.    [facility]  [phone]

## 2015-02-19 DIAGNOSIS — E1151 Type 2 diabetes mellitus with diabetic peripheral angiopathy without gangrene: Secondary | ICD-10-CM | POA: Diagnosis not present

## 2015-02-19 DIAGNOSIS — B351 Tinea unguium: Secondary | ICD-10-CM | POA: Diagnosis not present

## 2015-02-19 DIAGNOSIS — L84 Corns and callosities: Secondary | ICD-10-CM | POA: Diagnosis not present

## 2015-02-24 ENCOUNTER — Other Ambulatory Visit: Payer: Self-pay | Admitting: Licensed Clinical Social Worker

## 2015-02-24 NOTE — Patient Outreach (Signed)
[facility]) Care Management  [facility] Social Work  02/24/2015  Kevin [name] 10/15/1955 [id number]  Subjective:    Objective:   Current Medications:  Current Outpatient Prescriptions  Medication Sig Dispense Refill  . ACCU-CHEK AVIVA PLUS test strip USE TO CHECK BLOOD SUGAR ONCE DAILY. 50 each 10  . amLODipine (NORVASC) 5 MG tablet Take 1 tablet (5 mg total) by mouth daily. For blood pressure 30 tablet 6  . blood glucose meter kit and supplies KIT Dispense based on patient and insurance preference. Use to monitor FSBS 1 daily. Dx: E11.8 1 each 0  . cetirizine (ZYRTEC) 10 MG tablet Take 1 tablet (10 mg total) by mouth daily. For allergies 30 tablet 6  . clotrimazole-betamethasone (LOTRISONE) cream Apply 1 application topically 2 (two) times daily. 30 g 6  . diclofenac (VOLTAREN) 75 MG EC tablet TAKE (1) TABLET BY MOUTH TWICE DAILY FOR INFLAMMATION. 60 tablet 6  . diclofenac sodium (VOLTAREN) 1 % GEL Apply to knee three times a day as needed 1 Tube 6  . FLUoxetine (PROZAC) 20 MG capsule Take 1 capsule (20 mg total) by mouth daily. For your nerves 30 capsule 6  . folic acid (FOLVITE) 1 MG tablet Take 1 tablet (1 mg total) by mouth daily. 30 tablet 6  . furosemide (LASIX) 40 MG tablet Take 1 tablet (40 mg total) by mouth 2 (two) times daily. For fluid 60 tablet 6  . HYDROcodone-acetaminophen (NORCO/VICODIN) 5-325 MG per tablet Take 1 tablet by mouth 2 (two) times daily as needed. 60 tablet 0  . Insulin Glargine (TOUJEO SOLOSTAR) 300 UNIT/ML SOPN Inject 10 Units into the skin at bedtime. 1.5 mL 11  . Insulin Pen Needle 29G X 12.7MM MISC Use as directed 100 each 11  . metFORMIN (GLUCOPHAGE) 1000 MG tablet Take 1 tablet (1,000 mg total) by mouth 2 (two) times daily with a meal. For diabetes 60 tablet 6  . nitroGLYCERIN (NITROSTAT) 0.4 MG SL tablet Place 1 tablet under the tongue every 5 minutes as needed for chest pain x3. If not resolved after 3, go to ER. 30 tablet 6  .  potassium chloride (K-DUR) 10 MEQ tablet Take 1 tablet (10 mEq total) by mouth 2 (two) times daily. Take with potassium 60 tablet 6  . simvastatin (ZOCOR) 20 MG tablet Take 1 tablet (20 mg total) by mouth every evening. For cholesterol 30 tablet 6  . sitaGLIPtin (JANUVIA) 100 MG tablet Take 1 tablet (100 mg total) by mouth daily. 30 tablet 6   No current facility-administered medications for this visit.    Functional Status:  In your present state of health, do you have any difficulty performing the following activities: 02/03/2015 12/31/2014  Hearing? N N  Vision? N N  Difficulty concentrating or making decisions? N N  Walking or climbing stairs? Y Y  Dressing or bathing? Y N  Doing errands, shopping? Y N  Preparing Food and eating ? N Y  Using the Toilet? N N  In the past six months, have you accidently leaked urine? N N  Do you have problems with loss of bowel control? N N  Managing your Medications? Y N  Managing your Finances? Y N  Housekeeping or managing your Housekeeping? Y N    Fall/Depression Screening:  PHQ 2/9 Scores 01/08/2015 11/17/2014 02/17/2014  PHQ - 2 Score 0 0 2  PHQ- 9 Score - - 6    Assessment:   CSW met with client on 02/24/15 at home of  client for a routine home visit.  CSW and client spoke of current needs of client.  Client said he takes prescribed diabetes medication daily (tablet form). He said he is planning to speak soon with RN Kevin [name] about using prescribed diabetic pen for insulin injection.  He said he will see Kevin [name], at Dr. Liliane [name] office in [place], [place] on 03/18/15 at 11:30 AM.  Client will travel to and from appointment with Kevin [name] via [place] ([place] on 03/18/15.  Client said he is sleeping well. He said he is eating adequately.  He said he is using some of the Healthy Choice meals. He uses cane to ambulate but has to ambulate slowly up and down steps.  He said he saw Dr. Buelah [name] for appointemnt about  two weeks ago. He said appointment with Dr. Buelah [name] went well.  He said he has prescribed medications. He said he had appointment recently with podiatrist in [place], [place].  He said he has prescription from podiatrist and is planning on having prescription filled (medication prescribed by podiatrist was for a rash on his right foot.) Kevin [name] said that  home health aide continues to help him weekly as scheduled. He said home health aide helps him 5 days weekly as scheduled for about 2.5 hours per day scheduled.  He said he enjoys support received from home health aide. He said he has occasional pain in right knee. He said he takes medications as prescribed.  He said he still has pain in left arm radiating down from left shoulder to left hand (pain scale of pain per client is 8 on 10 point scale).  Client said he has not seen neurologist recently. However, client has talked with Dr. Buelah [name], primary doctor about pain issues. Client understands that he has a pinched nerve in his spine and understands from conversation with Dr. Buelah [name] that client may not be a good candidate for surgery for these pain issues at present.  Client said that finances are going well for monthly expenses.  He said he has adequate food supply at present.   Plan:   Client to take medications as prescribed and attend scheduled medical appointments. Client to communicate, as needed, with Dr. Buelah [name] to discuss medical needs of client. CSW to contact client in three weeks to assess needs of client at that time. CSW to communicate above information pertaining to client with RN Kevin [name] on 02/24/15.   Kevin [name].Kevin [name] MSW, LCSW Licensed Clinical Social Worker [facility] Care Management [phone]

## 2015-03-05 ENCOUNTER — Other Ambulatory Visit: Payer: Self-pay | Admitting: *Deleted

## 2015-03-05 NOTE — Patient Outreach (Signed)
[facility]) Care Management   03/05/2015  ELVEN LABOY 1955-12-11 [id number]  LEVEN HOEL is an 59 y.o. male  Subjective:  "I don't want to start taking insulin when I've been able to get my sugar down because I'm eating good now. Can we ask her if I can hold off on it?"  Objective:  Mr. Tsang has been followed by [place] [facility] long term (> 2 years) for chronic disease management (HTN, DM, CAD, Hyperlipidemia) and community resources (LCSW: financial management assistance and transportation assistance and management). He is very intelligent but does have some literacy barriers. He lives at home with his step brother. He has extended family who visit infrequently. He is now seeing primary care, endocrinology, poidatry, and vision routinely. Prior to our involvement, Mr. Prater was not always seeing his providers.   The focus of our work has been on DM management. Mr. Lucarelli has been seeing the dietician and is greatly improved in the area of dietary management. Mr. Gerding says his improved adherence is because Dr. Buelah Manis wants to start him on insulin and he'll "do anything to not have to take insulin."  BP 128/72 mmHg  Pulse 70  SpO2 98%  Review of Systems  Constitutional: Negative.   HENT: Negative.   Eyes: Negative.   Respiratory: Negative.   Gastrointestinal: Negative.   Musculoskeletal: Positive for myalgias and joint pain. Negative for falls.  Skin: Negative.   Neurological: Negative.   Endo/Heme/Allergies: Negative.   Psychiatric/Behavioral: Negative.     Physical Exam  Constitutional: He is oriented to person, place, and time. Vital signs are normal. He appears well-developed and well-nourished. He is active.  Cardiovascular: Normal rate and regular rhythm.   Respiratory: Effort normal. No respiratory distress.  GI: Soft. Bowel sounds are normal.  Neurological: He is alert and oriented to person, place, and time.  Skin: Skin is warm  and dry. Rash noted.     Psychiatric: He has a normal mood and affect. His behavior is normal. Judgment and thought content normal. Cognition and memory are normal.  Patient has somewhat difficult to understand speech at baseline; slurs some words but pattern is not slowed or changed from baseline    Current Medications:   Current Outpatient Prescriptions  Medication Sig Dispense Refill  . ACCU-CHEK AVIVA PLUS test strip USE TO CHECK BLOOD SUGAR ONCE DAILY. 50 each 10  . amLODipine (NORVASC) 5 MG tablet Take 1 tablet (5 mg total) by mouth daily. For blood pressure 30 tablet 6  . blood glucose meter kit and supplies KIT Dispense based on patient and insurance preference. Use to monitor FSBS 1 daily. Dx: E11.8 1 each 0  . cetirizine (ZYRTEC) 10 MG tablet Take 1 tablet (10 mg total) by mouth daily. For allergies 30 tablet 6  . clotrimazole-betamethasone (LOTRISONE) cream Apply 1 application topically 2 (two) times daily. 30 g 6  . diclofenac (VOLTAREN) 75 MG EC tablet TAKE (1) TABLET BY MOUTH TWICE DAILY FOR INFLAMMATION. 60 tablet 6  . FLUoxetine (PROZAC) 20 MG capsule Take 1 capsule (20 mg total) by mouth daily. For your nerves 30 capsule 6  . folic acid (FOLVITE) 1 MG tablet Take 1 tablet (1 mg total) by mouth daily. 30 tablet 6  . furosemide (LASIX) 40 MG tablet Take 1 tablet (40 mg total) by mouth 2 (two) times daily. For fluid 60 tablet 6  . HYDROcodone-acetaminophen (NORCO/VICODIN) 5-325 MG per tablet Take 1 tablet by mouth 2 (two) times daily  as needed. 60 tablet 0  . metFORMIN (GLUCOPHAGE) 1000 MG tablet Take 1 tablet (1,000 mg total) by mouth 2 (two) times daily with a meal. For diabetes 60 tablet 6  . nitroGLYCERIN (NITROSTAT) 0.4 MG SL tablet Place 1 tablet under the tongue every 5 minutes as needed for chest pain x3. If not resolved after 3, go to ER. 30 tablet 6  . potassium chloride (K-DUR) 10 MEQ tablet Take 1 tablet (10 mEq total) by mouth 2 (two) times daily. Take with potassium  60 tablet 6  . simvastatin (ZOCOR) 20 MG tablet Take 1 tablet (20 mg total) by mouth every evening. For cholesterol 30 tablet 6  . sitaGLIPtin (JANUVIA) 100 MG tablet Take 1 tablet (100 mg total) by mouth daily. 30 tablet 6  . diclofenac sodium (VOLTAREN) 1 % GEL Apply to knee three times a day as needed (Patient not taking: Reported on 03/05/2015) 1 Tube 6  . Insulin Glargine (TOUJEO SOLOSTAR) 300 UNIT/ML SOPN Inject 10 Units into the skin at bedtime. (Patient not taking: Reported on 03/05/2015) 1.5 mL 11  . Insulin Pen Needle 29G X 12.7MM MISC Use as directed (Patient not taking: Reported on 03/05/2015) 100 each 11   No current facility-administered medications for this visit.    Functional Status:   In your present state of health, do you have any difficulty performing the following activities: 03/05/2015 02/03/2015  Hearing? N N  Vision? N N  Difficulty concentrating or making decisions? N N  Walking or climbing stairs? Y Y  Dressing or bathing? Y Y  Doing errands, shopping? [name]  Preparing Food and eating ? N N  Using the Toilet? N N  In the past six months, have you accidently leaked urine? N N  Do you have problems with loss of bowel control? N N  Managing your Medications? N Y  Managing your Finances? [name]  Housekeeping or managing your Housekeeping? [name]    Fall/Depression Screening:    PHQ 2/9 Scores 01/08/2015 11/17/2014 02/17/2014  PHQ - 2 Score 0 0 2  PHQ- 9 Score - - 6    Assessment:  Multiple Chronic Health conditions  Diabetes - poorly managed by patient but improving adherence to prescribed diet and medication regimen; Mr. Vanwey still has not started insulin prescribed by Dr. Buelah Manis. He says since she told him he had to start insulin, he has been working harder on his diet and showed me that his 04/22/29 day cbg averages have been < 200; he said he wanted to ask Dr Buelah Manis once last time since he's kept good records and gotten his cbg's down, if he can avoid starting  insulin  7 day average (n=5) 179 14 day average (n=10) 186 30 day average (n=22) 179  Plan:   [place] Problem One        Patient Outreach from 02/24/2015 in [place] Problem One  Client needs to attend scheduled medical appointments   Care Plan for Problem One  Active   THN CM Short Term Goal #1 (0-30 days)  Client will attend all scheduled medical appointmetns for next 30 days   THN CM Short Term Goal #1 Start Date  02/24/15   [facility] CM Short Term Goal #1 Met Date  02/24/15 [Goal met]   Interventions for Short Term Goal #1  CSW and clielnt spoke o f importance of client attend all scheduled medical appointns (such as podiatry, medical, gastroenterology, nutritional  counseling)    [place] Problem Two        Patient Outreach from 03/05/2015 in [place] Problem Two  poor blood sugar control   Care Plan for Problem Two  Active   THN Long Term Goal (31-90) days  patient will note improvement in cbg's and HgA1C over the next 90 days as evidenced by lab data/documentation   THN Long Term Goal Start Date  12/23/14   [facility] CM Short Term Goal #1 (0-30 days)  patient will see RD, CDE as scheduled at the outpatient nutrition and diabetes management centern 12/25/14 oas evidenced by RD documentation in EPIC   THN CM Short Term Goal #1 Start Date  12/23/14   [facility] CM Short Term Goal #1 Met Date   03/05/15   Interventions for Short Term Goal #2   referral request made to primary care provider   [facility] CM Short Term Goal #2 (0-30 days)  patient will take medications as prescribed from blister packs at least 50% of the time over the next 30 days as evidenced by pill count   THN CM Short Term Goal #2 Start Date  03/05/15   Interventions for Short Term Goal #2  Did not meet goal last 2 months of 50% adherence,  utilzing teachback method and examination of blister packs, reviewed medications and dicussed plan with roomate Rusty and Mr. Filley  niece who was visiting to help him improve adherence in the next 30 days by helpful reminders   THN CM Short Term Goal #3 (0-30 days)  patient will verbalize basic understanding of plate method of nutrition management plan as outlined by RD, CDE over the next 30 days   THN CM Short Term Goal #3 Start Date  12/23/14   [facility] CM Short Term Goal #3 Met Date  03/05/15    [facility] CM Care Plan Problem Three        Patient Outreach from 02/03/2015 in [place] Problem Three  Knowledge deficit related to use of insulin (new)    Care Plan for Problem Three  Active   THN Long Term Goal (31-90) days  patient will successfully demonstrate proper self administration of insulin over the next 60 days as evidenced by demonstration for [facility]   [facility] Long Term Goal Start Date  02/03/15   Interventions for Problem Three Long Term Goal  utilizing teachback method and demonstration, reviewed new medication and prescribing information with patient   THN CM Short Term Goal #1 (0-30 days)  patient will verbalize understanding of rationale for new medication in the next 30 days   Interventions for Short Term Goal #1  utilizating teachback method, reviewed rationale for use of insulin with patient   THN CM Short Term Goal #2 (0-30 days)  patient will verbalize signs and symptoms of hypoglycemia over the next 30 days   THN CM Short Term Goal #2 Start Date  02/03/15   Interventions for Short Term Goal #2  utilizing teachback method, reviewed signs and symptoms of hypoglycemia with patient   THN CM Short Term Goal #3 (0-30 days)  patient will verbalize knowledge of when to call for help for symptoms related to diabetes   THN  CM Short Term Goal #3 Start Date  02/03/15   Interventions for Short Term Goal #3  utilizing teachback method, reviewed with patient and step brother when to call for help for symptoms related to diabetes  [place] Management  [phone]

## 2015-03-18 ENCOUNTER — Encounter: Payer: Medicare Other | Attending: "Endocrinology | Admitting: Nutrition

## 2015-03-18 VITALS — Ht 70.0 in | Wt 246.8 lb

## 2015-03-18 DIAGNOSIS — IMO0002 Reserved for concepts with insufficient information to code with codable children: Secondary | ICD-10-CM

## 2015-03-18 DIAGNOSIS — E669 Obesity, unspecified: Secondary | ICD-10-CM | POA: Insufficient documentation

## 2015-03-18 DIAGNOSIS — Z6835 Body mass index (BMI) 35.0-35.9, adult: Secondary | ICD-10-CM | POA: Diagnosis not present

## 2015-03-18 DIAGNOSIS — E118 Type 2 diabetes mellitus with unspecified complications: Secondary | ICD-10-CM | POA: Insufficient documentation

## 2015-03-18 DIAGNOSIS — Z794 Long term (current) use of insulin: Secondary | ICD-10-CM | POA: Insufficient documentation

## 2015-03-18 DIAGNOSIS — E1165 Type 2 diabetes mellitus with hyperglycemia: Secondary | ICD-10-CM

## 2015-03-18 DIAGNOSIS — Z713 Dietary counseling and surveillance: Secondary | ICD-10-CM | POA: Diagnosis not present

## 2015-03-18 NOTE — Progress Notes (Signed)
  Medical Nutrition Therapy:  Appt start time: 1130end time:  1200.  Assessment:  Primary concerns today: Diabetes. HIs brother, [name] is here with him.  FBS 138 BS much better now that he is on 10 units of Levemir. BS were in the 200's in am.  Has been healthy choice meals now for lunch and dinner and eating cherrios for breakfast and drinking a lot more water, 4-5 bottles per day. Lost 2-3 lbs since last visit. Cut out snacks. . Drinking more water. Has cut out hot dogs. Eating Cherrios with skim milk for breakfast. Not as thirsty or urniating as much. Sleeping better. Feels better.   He cut out chocolate milk, juices, sodas and tea.      Taking Metformin 1000 mg BID and Januvia 100 mg daily. Pills are put in bubble packs and he is compliant with those now. Now eating Healthy Choice meals instead of Hungry Man and eating less fried and processed foods.  Preferred Learning Style:    Auditory  Visual  Hands on  Limited reading and comprehension ability.  Learning Readiness:    Ready  Change in progress   MEDICATIONS: See list   DIETARY INTAKE:  24-hr recall:  B ( AM): Cherrios, Skim milk,  1/2 banana Snk ( AM): none  L ( PM): Healthy Choice meals. Apple Snk ( PM):  D ( PM): Healthy Choice Meal, Water Snk ( PM):  Sf jello, Beverages: water,  Usual physical activity: walks with cane and walks to mailbox but can't walk much further due to knee problems.  Estimated energy needs: 1800 calories 200 g carbohydrates 135 g protein 50 g fat  Progress Towards Goal(s):  In progress.   Nutritional Diagnosis:  NB-1.1 Food and nutrition-related knowledge deficit As related to Diabetes.  As evidenced by A1C 10.4%..    Intervention:  Nutrition counseling and diabetes education provided on the Plate Method, food choices to have per meal, portion sizes, target ranges for blood sugars, benefits of improving food choices, need to avoid high processed and high fat foods and benefits of  weight loss. Stressed need to avoid junk food, cakes, cookies, pies, sweets, sodas, tea and juices.  Goals: Keep up the good job!!  Follow Diabetes Meal Plan as instructed  Eat breakfast daily at 9 am.  Don't skip meals.  Take Levemir insulin about the same time every day at lunchtime.  Avoid sugar free products other than sugar free jello.       Add salad or vegetables and a piece of fruit with lunch and dinner meals. Continue to drink water. Increase walking by walking to mail box twice a day..  Lose 3-4 lbs in 3 months.        Get A1C down to 8% or lower in three months.   Teaching Method Utilized:  Visual Auditory Hands on  Handouts given during visit include: The Plate Method Meal Plan Card      Low Literacy Diabetes Instructions.  Barriers to learning/adherence to lifestyle change: none  Demonstrated degree of understanding via:  Teach Back   Monitoring/Evaluation:  Dietary intake, exercise, meal planning, SBG, and body weight in 3 month(s).

## 2015-03-18 NOTE — Patient Instructions (Signed)
Keep up the good job!!  Follow Diabetes Meal Plan as instructed  Eat breakfast daily at 9 am.  Don't skip meals.  Take Levemir insulin about the same time every day at lunchtime.  Avoid sugar free products other than sugar free jello.       Add salad or vegetables and a piece of fruit with lunch and dinner meals. Continue to drink water. Increase walking by walking to mail box twice a day..  Lose 3-4 lbs in 3 months.        Get A1C down to 8% or lower in three months.

## 2015-03-24 ENCOUNTER — Other Ambulatory Visit: Payer: Self-pay | Admitting: *Deleted

## 2015-03-24 NOTE — Patient Outreach (Signed)
[name]) Care Management   03/24/2015  Kevin [name] 1955/12/02 [id number]  Kevin [name] is an 59 y.o. male  Subjective: Kevin [name] has been followed by [facility] long term (> 2 years) for chronic disease management (HTN, DM, CAD, Hyperlipidemia) and community resources (LCSW: financial management assistance and transportation assistance and management). He is very intelligent but does have some literacy barriers. He lives at home with his step brother. He has extended family who visit infrequently. He is now seeing primary care, endocrinology, poidatry, and vision routinely. Prior to our involvement, Kevin [name] was not always seeing his providers.   The focus of our work has been on DM management. Kevin [name] has been seeing the dietician and is greatly improved in the area of dietary management. Kevin [name] has just started taking insulin. See assessment and plan below.    Objective:  BP 120/80 mmHg  Pulse 65  SpO2 97%   Review of Systems  Constitutional: Negative.   HENT: Negative.   Eyes: Negative.   Respiratory: Negative.   Cardiovascular: Negative.   Gastrointestinal: Negative.   Genitourinary: Negative.   Musculoskeletal: Negative.   Skin: Negative.   Neurological: Negative.   Endo/Heme/Allergies: Negative.   Psychiatric/Behavioral: Negative.     Physical Exam  Constitutional: He is oriented to person, place, and time. Vital signs are normal. He appears well-developed and well-nourished. He is active.  Cardiovascular: Normal rate, regular rhythm and normal heart sounds.   Respiratory: Effort normal and breath sounds normal.  GI: Soft. Bowel sounds are normal.  Neurological: He is alert and oriented to person, place, and time.  Skin: Skin is warm, dry and intact.  Psychiatric: He has a normal mood and affect. His speech is normal and behavior is normal. Judgment and thought content normal. Cognition and memory are normal.     Current Medications:   Current Outpatient Prescriptions  Medication Sig Dispense Refill  . ACCU-CHEK AVIVA PLUS test strip USE TO CHECK BLOOD SUGAR ONCE DAILY. 50 each 10  . amLODipine (NORVASC) 5 MG tablet Take 1 tablet (5 mg total) by mouth daily. For blood pressure 30 tablet 6  . blood glucose meter kit and supplies KIT Dispense based on patient and insurance preference. Use to monitor FSBS 1 daily. Dx: E11.8 1 each 0  . cetirizine (ZYRTEC) 10 MG tablet Take 1 tablet (10 mg total) by mouth daily. For allergies 30 tablet 6  . clotrimazole-betamethasone (LOTRISONE) cream Apply 1 application topically 2 (two) times daily. 30 g 6  . diclofenac (VOLTAREN) 75 MG EC tablet TAKE (1) TABLET BY MOUTH TWICE DAILY FOR INFLAMMATION. 60 tablet 6  . diclofenac sodium (VOLTAREN) 1 % GEL Apply to knee three times a day as needed 1 Tube 6  . FLUoxetine (PROZAC) 20 MG capsule Take 1 capsule (20 mg total) by mouth daily. For your nerves 30 capsule 6  . folic acid (FOLVITE) 1 MG tablet Take 1 tablet (1 mg total) by mouth daily. 30 tablet 6  . furosemide (LASIX) 40 MG tablet Take 1 tablet (40 mg total) by mouth 2 (two) times daily. For fluid 60 tablet 6  . HYDROcodone-acetaminophen (NORCO/VICODIN) 5-325 MG per tablet Take 1 tablet by mouth 2 (two) times daily as needed. 60 tablet 0  . Insulin Glargine (TOUJEO SOLOSTAR) 300 UNIT/ML SOPN Inject 10 Units into the skin at bedtime. 1.5 mL 11  . Insulin Pen Needle 29G X 12.7MM MISC Use as directed 100 each 11  .  nitroGLYCERIN (NITROSTAT) 0.4 MG SL tablet Place 1 tablet under the tongue every 5 minutes as needed for chest pain x3. If not resolved after 3, go to ER. 30 tablet 6  . potassium chloride (K-DUR) 10 MEQ tablet Take 1 tablet (10 mEq total) by mouth 2 (two) times daily. Take with potassium 60 tablet 6  . simvastatin (ZOCOR) 20 MG tablet Take 1 tablet (20 mg total) by mouth every evening. For cholesterol 30 tablet 6  . sitaGLIPtin (JANUVIA) 100 MG tablet Take  1 tablet (100 mg total) by mouth daily. 30 tablet 6  . metFORMIN (GLUCOPHAGE) 1000 MG tablet Take 1 tablet (1,000 mg total) by mouth 2 (two) times daily with a meal. For diabetes 60 tablet 6   No current facility-administered medications for this visit.    Assessment:   Diabetes - poorly managed by patient but improving adherence to prescribed diet and seeing [name] RD, CDE routinely; he is showing some improvement on adherence to medication regimen; Kevin [name] was very hesitant to  start insulin prescribed by Dr. Buelah Manis and tried over the course of about 6 weeks to control his cbg's by working harder on his diet and showed me that his 04/22/29 day cbg averages had been < 200; after repeated visits, conversations, and visits, Kevin [name] agreed to start taking his prescribed insulin; he has now been taking Insulin Glargine (TOUJEO SOLOSTAR) 300 UNIT/ML 10u daily for 2 weeks and is seeing striking results (see below); Kevin [name] said "it's not hard and I feel better. I should have started taking it a long time ago."     7 day average 2 weeks ago 179  (n =5) / TODAY 137 (n = 9) 14 day average 2 weeks ago 186  (n=10) / TODAY 159 (n = 13) 30 day average 2 weeks ago 179  (n=22) / TODAY 172 (n = 25)    Plan:   Kevin [name] will continue daily cbg, bp, pulse, O2 sat checks, and weights and will record.  Kevin [name] will continue to take medications, including insulin, as prescribed.   I will see Kevin [name] at home again on 05/01/15.       Patient Outreach from 03/24/2015 in [place] Problem Three  Medication Non Adherence   Care Plan for Problem Three  Active   THN Long Term Goal (31-90) days  patient will take insulin as prescribed daily over the next 31 days as evidenced by medication review   THN Long Term Goal Start Date  03/24/15   Interventions for Problem Three Long Term Goal  utilizing teachback method and demonstration, reviewed new medication and  prescribing information with patient   THN CM Short Term Goal #1 (0-30 days)  patient will verbalize understanding of importance of adhering daily to his prescribed insulin regimen over the next 30 days   THN CM Short Term Goal #1 Start Date  03/24/15   Interventions for Short Term Goal #1  utilizating teachback method, reviewed rationale for use of insulin with patient       [facility]  [phone]

## 2015-04-21 ENCOUNTER — Encounter: Payer: Self-pay | Admitting: *Deleted

## 2015-04-21 ENCOUNTER — Other Ambulatory Visit: Payer: Self-pay | Admitting: *Deleted

## 2015-04-21 NOTE — Patient Outreach (Signed)
[facility]) Care Management   04/21/2015  Kevin [name] 11-Jul-1956 [id number]  Kevin [name] is an 59 y.o. male  Subjective: Kevin [name] has been followed by [facility] long term (> 2 years) for chronic disease management (HTN, DM, CAD, Hyperlipidemia) and community resources (LCSW: financial management assistance and transportation assistance and management). He is very intelligent but does have some literacy barriers. He lives at home with his step brother. He has extended family who visit infrequently. He is now seeing primary care, endocrinology, poidatry, and vision routinely. Prior to our involvement, Kevin [name] was not always seeing his providers.   The focus of our work has been on DM management. Kevin [name] has been seeing the dietician and is greatly improved in the area of dietary management. Kevin [name] has just started taking insulin. See assessment and plan below.   Objective:  BP 122/72 mmHg  Pulse 73  SpO2 96%  Review of Systems  Constitutional: Negative.   HENT: Negative.   Eyes: Negative.   Respiratory: Negative.   Cardiovascular: Negative.   Gastrointestinal: Negative.   Genitourinary: Negative.   Musculoskeletal: Positive for myalgias and joint pain.  Skin: Negative.   Neurological: Positive for tingling and focal weakness.       Tingling, burning, aching left arm/hand  Endo/Heme/Allergies: Negative.   Psychiatric/Behavioral: Negative.     Physical Exam  Constitutional: He is oriented to person, place, and time. Vital signs are normal. He appears well-developed and well-nourished. He is active.  Cardiovascular: Normal rate and regular rhythm.   Respiratory: Effort normal and breath sounds normal.  GI: Soft. Bowel sounds are normal.  Neurological: He is alert and oriented to person, place, and time.  Skin: Skin is warm, dry and intact.  Psychiatric: He has a normal mood and affect. His speech is normal and behavior is  normal. Judgment and thought content normal. Cognition and memory are normal.    Current Medications:   Current Outpatient Prescriptions  Medication Sig Dispense Refill  . ACCU-CHEK AVIVA PLUS test strip USE TO CHECK BLOOD SUGAR ONCE DAILY. 50 each 10  . amLODipine (NORVASC) 5 MG tablet Take 1 tablet (5 mg total) by mouth daily. For blood pressure 30 tablet 6  . blood glucose meter kit and supplies KIT Dispense based on patient and insurance preference. Use to monitor FSBS 1 daily. Dx: E11.8 1 each 0  . cetirizine (ZYRTEC) 10 MG tablet Take 1 tablet (10 mg total) by mouth daily. For allergies 30 tablet 6  . clotrimazole-betamethasone (LOTRISONE) cream Apply 1 application topically 2 (two) times daily. 30 g 6  . diclofenac (VOLTAREN) 75 MG EC tablet TAKE (1) TABLET BY MOUTH TWICE DAILY FOR INFLAMMATION. 60 tablet 6  . diclofenac sodium (VOLTAREN) 1 % GEL Apply to knee three times a day as needed 1 Tube 6  . FLUoxetine (PROZAC) 20 MG capsule Take 1 capsule (20 mg total) by mouth daily. For your nerves 30 capsule 6  . folic acid (FOLVITE) 1 MG tablet Take 1 tablet (1 mg total) by mouth daily. 30 tablet 6  . furosemide (LASIX) 40 MG tablet Take 1 tablet (40 mg total) by mouth 2 (two) times daily. For fluid 60 tablet 6  . HYDROcodone-acetaminophen (NORCO/VICODIN) 5-325 MG per tablet Take 1 tablet by mouth 2 (two) times daily as needed. 60 tablet 0  . Insulin Glargine (TOUJEO SOLOSTAR) 300 UNIT/ML SOPN Inject 10 Units into the skin at bedtime. 1.5 mL 11  . Insulin  Pen Needle 29G X 12.7MM MISC Use as directed 100 each 11  . metFORMIN (GLUCOPHAGE) 1000 MG tablet Take 1 tablet (1,000 mg total) by mouth 2 (two) times daily with a meal. For diabetes 60 tablet 6  . nitroGLYCERIN (NITROSTAT) 0.4 MG SL tablet Place 1 tablet under the tongue every 5 minutes as needed for chest pain x3. If not resolved after 3, go to ER. 30 tablet 6  . potassium chloride (K-DUR) 10 MEQ tablet Take 1 tablet (10 mEq total) by  mouth 2 (two) times daily. Take with potassium 60 tablet 6  . simvastatin (ZOCOR) 20 MG tablet Take 1 tablet (20 mg total) by mouth every evening. For cholesterol 30 tablet 6  . sitaGLIPtin (JANUVIA) 100 MG tablet Take 1 tablet (100 mg total) by mouth daily. 30 tablet 6   No current facility-administered medications for this visit.    Functional Status:   In your present state of health, do you have any difficulty performing the following activities: 03/05/2015 02/03/2015  Hearing? N N  Vision? N N  Difficulty concentrating or making decisions? N N  Walking or climbing stairs? Y Y  Dressing or bathing? Y Y  Doing errands, shopping? [name]  Preparing Food and eating ? N N  Using the Toilet? N N  In the past six months, have you accidently leaked urine? N N  Do you have problems with loss of bowel control? N N  Managing your Medications? N Y  Managing your Finances? [name]  Housekeeping or managing your Housekeeping? [name]    Fall/Depression Screening:    PHQ 2/9 Scores 01/08/2015 11/17/2014 02/17/2014  PHQ - 2 Score 0 0 2  PHQ- 9 Score - - 6    Assessment:    Diabetes - poorly managed by patient but improving adherence to prescribed diet and seeing [name] RD, CDE routinely; he is showing some improvement on adherence to medication regimen; Kevin [name] was very hesitant to start insulin prescribed by Dr. Buelah [name] and tried over the course of about 6 weeks to control his cbg's by working harder on his diet and showed me that his 04/22/29 day cbg averages had been < 200; after repeated visits, conversations, and visits, Kevin [name] agreed to start taking his prescribed insulin; he has been taking Insulin Glargine (TOUJEO SOLOSTAR) 300 UNIT/ML 10u daily    7 day average 6 weeks ago 179 (n =5) / 1 month ago 137 (n = 9) / TODAY = 179 (N=5)  14 day average 6 weeks ago 186 (n=10) / 1 month ago 159 (n = 13) / TODAY = 172 (N=11) 30 day average 6 weeks ago 179 (n=22) / 1 month ago 172 (n = 25) /  TODAY = 158 (N=30)  Medication Concerns - Kevin [name] says he has been taking Hydrocodone/Acetaminophen 5-325 at night to help his foot/leg spasms and arm pain and this is the only thing that eases his discomfort enough so that he can sleep. He is out of Norco and wants to see if Dr. Buelah [name] would be wiling to provide another prescription or recommendation about how to deal with his pain.   Plan:   Mr. Halladay will continue daily cbg, bp, pulse, O2 sat checks, and weights and will record.  Mr. Weida will continue to take medications, including insulin, as prescribed.   I will see Mr. Faller at home again in August.        Patient Outreach from 04/21/2015 in [employer]  Care Plan Problem Two  poor blood sugar control   Care Plan for Problem Two  Active   Interventions for Problem Two Long Term Goal   utilizing teachback method, reviewed with patient and brother the importance of maintaining adherence to diabetes plan of care   THN Long Term Goal (31-90) days  patient will note improvement in cbg's and HgA1C over the next 90 days as evidenced by lab data/documentation   THN Long Term Goal Start Date  04/21/15   THN CM Short Term Goal #1 (0-30 days)  over the next 30 days patient will verbalize understanding of importance of medication adherence, including insulin   THN CM Short Term Goal #1 Start Date  04/21/15   Interventions for Short Term Goal #2   utilizing teachback method, discussed with patient rationale for use of oral diabetes medications AND insulin for treatment of diabetes   THN CM Short Term Goal #2 (0-30 days)  over the next 30 days patient will check cbg's daily and record, calling provider for cbg <70 or > 250   THN CM Short Term Goal #2 Start Date  04/21/15   Interventions for Short Term Goal #2  utilizing teachback method, reviewed importance of daily monitoring of cbg and recommended parameters/when to call and report findings outside parameters          [facility]  [phone]

## 2015-04-30 DIAGNOSIS — L84 Corns and callosities: Secondary | ICD-10-CM | POA: Diagnosis not present

## 2015-04-30 DIAGNOSIS — E1151 Type 2 diabetes mellitus with diabetic peripheral angiopathy without gangrene: Secondary | ICD-10-CM | POA: Diagnosis not present

## 2015-04-30 DIAGNOSIS — B351 Tinea unguium: Secondary | ICD-10-CM | POA: Diagnosis not present

## 2015-05-12 ENCOUNTER — Other Ambulatory Visit: Payer: Self-pay | Admitting: Family Medicine

## 2015-05-19 ENCOUNTER — Telehealth: Payer: Self-pay | Admitting: *Deleted

## 2015-05-19 ENCOUNTER — Other Ambulatory Visit: Payer: Self-pay | Admitting: *Deleted

## 2015-05-19 ENCOUNTER — Encounter: Payer: Self-pay | Admitting: *Deleted

## 2015-05-19 MED ORDER — HYDROCODONE-ACETAMINOPHEN 5-325 MG PO TABS: 1.0000 | ORAL_TABLET | Freq: Two times a day (BID) | ORAL | Status: DC | PRN

## 2015-05-19 NOTE — Patient Outreach (Signed)
Kevin [name]) Care Management   05/19/2015  Kevin [name] [birth year] [id number]  Kevin [name] is an 59 y.o. male  Subjective: Kevin [name] has been followed by [facility] long term (> 2 years) for chronic disease management (HTN, DM, CAD, Hyperlipidemia) and community resources (LCSW: financial management assistance and transportation assistance and management). He is very intelligent but does have some literacy barriers. He lives at home with his step brother. He has extended family who visit infrequently. He is now seeing primary care, endocrinology, poidatry, and vision routinely. Prior to our involvement, Kevin [name] was not always seeing his providers.   The focus of our work has been on DM management. Kevin [name] has been seeing the dietician and is greatly improved in the area of dietary management. Kevin [name] is nonadherent to prescribed medications.    Objective:  BP 110/70 mmHg  Pulse 74  SpO2 97%  Review of Systems  Constitutional: Negative.   HENT: Negative.   Eyes: Negative.   Respiratory: Negative.   Cardiovascular: Negative.   Gastrointestinal: Negative.   Genitourinary: Negative.   Musculoskeletal: Positive for myalgias and joint pain. Negative for falls.  Skin: Negative.   Neurological: Negative.   Psychiatric/Behavioral: Negative.     Physical Exam  Constitutional: He is oriented to person, place, and time. Vital signs are normal. He appears well-developed and well-nourished. He is active.  Cardiovascular: Normal rate and regular rhythm.   Respiratory: Effort normal and breath sounds normal.  GI: Soft. Bowel sounds are normal.  Musculoskeletal: Normal range of motion.  Neurological: He is alert and oriented to person, place, and time.  Skin: Skin is warm and dry.  Psychiatric: He has a normal mood and affect. His speech is normal and behavior is normal. Judgment and thought content normal. Cognition and memory are normal.     Current Medications:   Current Outpatient Prescriptions  Medication Sig Dispense Refill  . ACCU-CHEK AVIVA PLUS test strip USE TO CHECK BLOOD SUGAR ONCE DAILY. 50 each 11  . amLODipine (NORVASC) 5 MG tablet Take 1 tablet (5 mg total) by mouth daily. For blood pressure 30 tablet 6  . blood glucose meter kit and supplies KIT Dispense based on patient and insurance preference. Use to monitor FSBS 1 daily. Dx: E11.8 1 each 0  . cetirizine (ZYRTEC) 10 MG tablet Take 1 tablet (10 mg total) by mouth daily. For allergies 30 tablet 6  . clotrimazole-betamethasone (LOTRISONE) cream Apply 1 application topically 2 (two) times daily. 30 g 6  . diclofenac (VOLTAREN) 75 MG EC tablet TAKE (1) TABLET BY MOUTH TWICE DAILY FOR INFLAMMATION. 60 tablet 6  . diclofenac sodium (VOLTAREN) 1 % GEL Apply to knee three times a day as needed 1 Tube 6  . FLUoxetine (PROZAC) 20 MG capsule Take 1 capsule (20 mg total) by mouth daily. For your nerves 30 capsule 6  . folic acid (FOLVITE) 1 MG tablet Take 1 tablet (1 mg total) by mouth daily. 30 tablet 6  . furosemide (LASIX) 40 MG tablet Take 1 tablet (40 mg total) by mouth 2 (two) times daily. For fluid 60 tablet 6  . HYDROcodone-acetaminophen (NORCO/VICODIN) 5-325 MG per tablet Take 1 tablet by mouth 2 (two) times daily as needed. 60 tablet 0  . Insulin Glargine (TOUJEO SOLOSTAR) 300 UNIT/ML SOPN Inject 10 Units into the skin at bedtime. 1.5 mL 11  . Insulin Pen Needle 29G X 12.7MM MISC Use as directed 100 each 11  . metFORMIN (  GLUCOPHAGE) 1000 MG tablet Take 1 tablet (1,000 mg total) by mouth 2 (two) times daily with a meal. For diabetes 60 tablet 6  . nitroGLYCERIN (NITROSTAT) 0.4 MG SL tablet Place 1 tablet under the tongue every 5 minutes as needed for chest pain x3. If not resolved after 3, go to ER. 30 tablet 6  . potassium chloride (K-DUR) 10 MEQ tablet Take 1 tablet (10 mEq total) by mouth 2 (two) times daily. Take with potassium 60 tablet 6  . simvastatin  (ZOCOR) 20 MG tablet Take 1 tablet (20 mg total) by mouth every evening. For cholesterol 30 tablet 6  . sitaGLIPtin (JANUVIA) 100 MG tablet Take 1 tablet (100 mg total) by mouth daily. 30 tablet 6   No current facility-administered medications for this visit.    Functional Status:   In your present state of health, do you have any difficulty performing the following activities: 04/21/2015 03/05/2015  Hearing? N N  Vision? - N  Difficulty concentrating or making decisions? N N  Walking or climbing stairs? Y Y  Dressing or bathing? Y Y  Doing errands, shopping? Kevin [name]  Preparing Food and eating ? N N  Using the Toilet? N N  In the past six months, have you accidently leaked urine? N N  Do you have problems with loss of bowel control? N N  Managing your Medications? N N  Managing your Finances? N Y  Housekeeping or managing your Housekeeping? Kevin [name]    Fall/Depression Screening:    PHQ 2/9 Scores 04/21/2015 01/08/2015 11/17/2014 02/17/2014  PHQ - 2 Score 0 0 0 2  PHQ- 9 Score - - - 6    Assessment:    Diabetes - poorly managed by patient but improving adherence to prescribed diet and seeing Kevin [name] RD, CDE routinely; he is showing worsening adherence to medication regimen; I found A FULL MONTHS SUPPLY MINUS 2 DAYS of medications in Kevin [name] blister packs from [facility]; he told me he felt like the insulin was working so well that he didn't think he needed to take the other medicines and he was afraid they would "act against each other"; I reviewed with him and his brother Kevin [name] how his cbg's have worsened over the last month as a result of his not taking his medications as prescribed he is taking Insulin Glargine (TOUJEO SOLOSTAR) 300 UNIT/ML 10u daily as prescribed:   I spent a good deal of time reviewing in detail medications, rationale for each, prescribing instructions, blister pack instuctions, and importance of adherence.     7 day average 3 months ago 179 (n =5) / 2 months ago  137 (n = 9) / 1 month ago = 179 (N=5)/ TODAY =  243 14 day average 3 months ago 186 (n=10) / 2 months ago 159 (n = 13) / 1 month ago = 172 (N=11)/ TODAY =  227 30 day average 3 months ago 179 (n=22) / 2 months ago 172 (n = 25) / 1 month ago = 158 (N=30)/ TODAY =  212  Medication Concerns - Mr. Swopes says he has been taking Hydrocodone/Acetaminophen 5-325 at night to help his foot/leg spasms and arm pain and this is the only thing that eases his discomfort enough so that he can sleep. He is out of Norco and wants to see if Dr. Buelah Manis would be wiling to provide another prescription or recommendation about how to deal with his pain. I contacted the office to request consideration.  Plan:  Mr. Monnier will continue daily cbg, bp, pulse, O2 sat checks, and weights and will record.  Mr. Mcgovern will continue to take medications, including insulin, as prescribed.   I will see Mr. Cutting at home again in September.   [facility] CM Care Plan Problem One        Patient Outreach from 02/24/2015 in [place] Problem One  Client needs to attend scheduled medical appointments   Care Plan for Problem One  Active   [facility] CM Short Term Goal #1 (0-30 days)  Client will attend all scheduled medical appointmetns for next 30 days   [facility] CM Short Term Goal #1 Start Date  02/24/15   [place] CM Short Term Goal #1 Met Date  02/24/15 [Goal met]   Interventions for Short Term Goal #1  CSW and clielnt spoke o f importance of client attend all scheduled medical appointns (such as podiatry, medical, gastroenterology, nutritional counseling)    [place] Problem Two        Patient Outreach from 05/19/2015 in [place] Problem Two  poor blood sugar control   Care Plan for Problem Two  Active   Interventions for Problem Two Long Term Goal   utilizing teachback method, reviewed with patient and brother the importance of maintaining adherence to diabetes plan of care    [facility] Long Term Goal (31-90) days  patient will note improvement in cbg's and HgA1C over the next 90 days as evidenced by lab data/documentation   [facility] Long Term Goal Start Date  05/19/15   [name] Long Term Goal Met Date  -- [did not meet previous month]   [facility] CM Short Term Goal #1 (0-30 days)  over the next 30 days patient will verbalize understanding of importance of medication adherence, including insulin   [facility] CM Short Term Goal #1 Start Date  05/19/15   Interventions for Short Term Goal #2   utilizing teachback method, discussed with patient rationale for use of oral diabetes medications AND insulin for treatment of diabetes   [facility] CM Short Term Goal #2 (0-30 days)  over the next 30 days patient will check cbg's daily and record, calling provider for cbg <70 or > 250   [facility] CM Short Term Goal #2 Start Date  05/19/15 [did not meet previous month]   Interventions for Short Term Goal #2  utilizing teachback method, reviewed importance of daily monitoring of cbg and recommended parameters/when to call and report findings outside parameters    [place] [name] CM Care Plan Problem Three        Patient Outreach from 05/19/2015 in [place] Problem Three  Medication Non Adherence   Care Plan for Problem Three  Active   THN Long Term Goal (31-90) days  over the next 31 days patient will demonstrate improvement in medication adherence as evidenced by pill counts and insulin syringe review   [facility]N Long Term Goal Start Date  05/19/15   Interventions for Problem Three Long Term Goal  utilizing teachback method and demonstration, reviewed new medication and prescribing information with patient   [facility] CM Short Term Goal #1 (0-30 days)  patient will verbalize understanding of importance of adhering daily to his prescribed insulin regimen over the next 30 days   [facility] CM Short Term Goal #1 Start Date  05/19/15   Interventions for Short Term Goal #1  utilizating teachback method, reviewed rationale for use of  insulin with patient   [facility] CM Short Term Goal #2 (0-30 days)  patient will take oral medications as prescribed at least 50% of the time over the next 30 days as evidenced by blister pack pill count   [facility] CM Short Term Goal #2 Start Date  05/19/15   Interventions for Short Term Goal #2  utilizing teachback method, reviewed medication blister pack use and vital importance of taking medications as prescribed      [facility]  [phone]

## 2015-05-19 NOTE — Telephone Encounter (Signed)
-----   Message from Alycia Rossetti, MD sent at 05/19/2015  1:03 PM EDT ----- Faythe Ghee to refill pain meds, and send over diabetic supplies Please put this in a phone note ----- Message -----    From: Eden Lathe Collan Schoenfeld, LPN    Sent: 02/12/2079  12:43 PM      To: Alycia Rossetti, MD  Ok to refill??  Last office visit/ refill 01/28/2015. ----- Message -----    From: Clerance Lav, RN    Sent: 05/19/2015  12:17 PM      To: Eden Lathe Ismail Graziani, LPN  Hello Margreta Journey,   I am seeing Mr. Jolly Bleicher at home today. A few things:   Pain Management : He is asking about a refill for his Hydrocodone/Acetaminiphen 5-325. The last thing I see is a bottle filled at [facility] in May. I have everything at [facility] now so he can get his meds in the blister packs ( as you know).   Diabetic Supplies: he is telling me that [facility] will not deliver his test strips but I think the issue is that he just showed up at [facility] to get strips instead of calling for them from [facility]. Would you be able to send a refill request and/or authorization for test strips for him?   Transportation Approval: Mr. Mcshan needs approval for transportation through [facility] to his eye doctor appointment for this Thursday. If you wouldn't mind calling or having whomever you designate call for that approval, we'd appreciate it!  Please let me know if any questions!   Thanks,  [place] [facility]  [phone]

## 2015-05-19 NOTE — Telephone Encounter (Signed)
Call placed to [facility].   Advised that since appointment is not an emergency, appointment will have to be re-scheduled to give them at least 5 business days to set up transportation. Also advised that since Medicaid does not pay for everything at eye appointment's, a fax will be needed from the eye doctor stating that they will bill Medicaid.   [facility] nurse to be made aware.

## 2015-05-19 NOTE — Telephone Encounter (Signed)
Call placed to [facility].   Was advised that patient picked up diabetic test strips on 05/12/2015.  Was also advised that prescription for Norco can be faxed and original can be mailed to pharmacy so that patient does not have to come and pick up.   Alisa, [facility] SN to be made aware.   Call placed to [facility]. [place].

## 2015-05-20 ENCOUNTER — Other Ambulatory Visit: Payer: Self-pay | Admitting: *Deleted

## 2015-05-20 NOTE — Patient Outreach (Signed)
[facility]) Care Management  05/20/2015  Kevin [name] [birth year] [id number]  Kevin [name] called to say he wasn't sure how to get "the letter" for Medicaid transportation approval for his appointment at the eye doctor tomorrow. I contacted [facility] [phone] and spoke with [name] who will fax the approval letter to [facility] at [phone]. I contacted [name] at [facility] to confirm that she has Kevin [name] scheduled for his appointment transport tomorrow. I returned a call to Kevin [name] to update him on information as outlined above.   [name] LCSW udpated.    [place] [facility]  [phone]

## 2015-05-21 DIAGNOSIS — E119 Type 2 diabetes mellitus without complications: Secondary | ICD-10-CM | POA: Diagnosis not present

## 2015-05-21 DIAGNOSIS — H52221 Regular astigmatism, right eye: Secondary | ICD-10-CM | POA: Diagnosis not present

## 2015-05-21 DIAGNOSIS — H5203 Hypermetropia, bilateral: Secondary | ICD-10-CM | POA: Diagnosis not present

## 2015-05-21 DIAGNOSIS — H524 Presbyopia: Secondary | ICD-10-CM | POA: Diagnosis not present

## 2015-05-29 ENCOUNTER — Other Ambulatory Visit: Payer: Self-pay | Admitting: Licensed Clinical Social Worker

## 2015-05-29 NOTE — Patient Outreach (Signed)
[facility]) Care Management  [facility] Social Work  05/29/2015  Kevin [name] Feb 19, 1956 [id number]  Subjective:    Objective:   Current Medications:  Current Outpatient Prescriptions  Medication Sig Dispense Refill  . ACCU-CHEK AVIVA PLUS test strip USE TO CHECK BLOOD SUGAR ONCE DAILY. (Patient not taking: Reported on 05/19/2015) 50 each 11  . amLODipine (NORVASC) 5 MG tablet Take 1 tablet (5 mg total) by mouth daily. For blood pressure (Patient not taking: Reported on 05/19/2015) 30 tablet 6  . blood glucose meter kit and supplies KIT Dispense based on patient and insurance preference. Use to monitor FSBS 1 daily. Dx: E11.8 1 each 0  . cetirizine (ZYRTEC) 10 MG tablet Take 1 tablet (10 mg total) by mouth daily. For allergies (Patient not taking: Reported on 05/19/2015) 30 tablet 6  . clotrimazole-betamethasone (LOTRISONE) cream Apply 1 application topically 2 (two) times daily. (Patient not taking: Reported on 05/19/2015) 30 g 6  . diclofenac (VOLTAREN) 75 MG EC tablet TAKE (1) TABLET BY MOUTH TWICE DAILY FOR INFLAMMATION. (Patient not taking: Reported on 05/19/2015) 60 tablet 6  . diclofenac sodium (VOLTAREN) 1 % GEL Apply to knee three times a day as needed (Patient not taking: Reported on 05/19/2015) 1 Tube 6  . FLUoxetine (PROZAC) 20 MG capsule Take 1 capsule (20 mg total) by mouth daily. For your nerves (Patient not taking: Reported on 05/19/2015) 30 capsule 6  . folic acid (FOLVITE) 1 MG tablet Take 1 tablet (1 mg total) by mouth daily. (Patient not taking: Reported on 05/19/2015) 30 tablet 6  . furosemide (LASIX) 40 MG tablet Take 1 tablet (40 mg total) by mouth 2 (two) times daily. For fluid (Patient not taking: Reported on 05/19/2015) 60 tablet 6  . HYDROcodone-acetaminophen (NORCO/VICODIN) 5-325 MG per tablet Take 1 tablet by mouth 2 (two) times daily as needed. 60 tablet 0  . Insulin Glargine (TOUJEO SOLOSTAR) 300 UNIT/ML SOPN Inject 10 Units into the skin at bedtime. 1.5 mL  11  . Insulin Pen Needle 29G X 12.7MM MISC Use as directed 100 each 11  . metFORMIN (GLUCOPHAGE) 1000 MG tablet Take 1 tablet (1,000 mg total) by mouth 2 (two) times daily with a meal. For diabetes (Patient not taking: Reported on 05/19/2015) 60 tablet 6  . nitroGLYCERIN (NITROSTAT) 0.4 MG SL tablet Place 1 tablet under the tongue every 5 minutes as needed for chest pain x3. If not resolved after 3, go to ER. (Patient not taking: Reported on 05/19/2015) 30 tablet 6  . potassium chloride (K-DUR) 10 MEQ tablet Take 1 tablet (10 mEq total) by mouth 2 (two) times daily. Take with potassium (Patient not taking: Reported on 05/19/2015) 60 tablet 6  . simvastatin (ZOCOR) 20 MG tablet Take 1 tablet (20 mg total) by mouth every evening. For cholesterol (Patient not taking: Reported on 05/19/2015) 30 tablet 6  . sitaGLIPtin (JANUVIA) 100 MG tablet Take 1 tablet (100 mg total) by mouth daily. (Patient not taking: Reported on 05/19/2015) 30 tablet 6   No current facility-administered medications for this visit.    Functional Status:  In your present state of health, do you have any difficulty performing the following activities: 05/19/2015 04/21/2015  Hearing? N N  Vision? N -  Difficulty concentrating or making decisions? N N  Walking or climbing stairs? Y Y  Dressing or bathing? Y Y  Doing errands, shopping? Y Y  Preparing Food and eating ? - N  Using the Toilet? - N  In the  past six months, have you accidently leaked urine? - N  Do you have problems with loss of bowel control? - N  Managing your Medications? - N  Managing your Finances? - N  Housekeeping or managing your Housekeeping? - Y    Fall/Depression Screening:  PHQ 2/9 Scores 04/21/2015 01/08/2015 11/17/2014 02/17/2014  PHQ - 2 Score 0 0 0 2  PHQ- 9 Score - - - 6    Assessment:   CSW met with client on 05/29/15 at home of client for a routine home visit.  Client said he uses a cane to help him ambulate.  He said he uses cane to help him ambulate in  the community.  He said he still has some pain in right knee.  He said he is taking pain medication as prescribed. He said he continues to see [name] fro Diabetes education and nutrition education. He said he is scheduled to see [name] again in September, 2016.  He said he has appointment with Dr. Buelah Manis on 06/01/15.  He said he had transportation arranged to go to and from appointment with Dr. Buelah Manis. He said he receives home health aide support in the home as scheduled five days weekly.  Client is very pleased with home health aide support he receives [facility] as scheduled. Client has his prescribed medications. Client said he gets confused related to his medication administration.  He said he gets confused sometimes related to medication to be taken related to diabetes. He said he takes injection as prescribed for diabetes and he said he also is to take a pill as prescribed for diabetes management.  He said he might miss a few doses occasionally of diabetic pill prescribed for him.  Client said he has adequate food supply.  Client said he had lost a few pounds. He said he is trying to follow diet recommended.  Client is attending medical appointments. Client said he  Is doing adequately financially in paying monthly bills.  Client said his sister contacts him regularly for support.   Client said his [facility] annual in home visit is scheduled with RN from [facility] for 06/17/15 at home of client.  Client said he can bath well, dress well, eat without assistance.  Client spoke of pinched nerve in neck and limited ability of client to close left hand all the way.  He said he still has pain in right knee and he is glad he has cane to use to help him stand and ambulate.  Client said he has diabetic shoes that fit well and that he wears these shoes when going into the community.  He said he has appointment with podiatrist September 29th of 2016.  He said he had an eye appointment  examination last week and this examination went well.  He wears glasses as needed.  CSW gave client [facility] CSW card and invited client to call CSW as needed to address social work needs of client.  Plan: Client to take medications as prescribed and attend scheduled medical appointments. Client to communicate with RN Janalyn Shy as needed to address nursing needs of client. CSW informed RN Janalyn Shy of above information on 05/29/15. CSW to contact client in one month to assess needs of client at that time.  Norva Riffle.Toribio Seiber MSW, LCSW Licensed Clinical Social Worker [facility] Care Management [phone]  Plan:

## 2015-06-01 ENCOUNTER — Ambulatory Visit (INDEPENDENT_AMBULATORY_CARE_PROVIDER_SITE_OTHER): Payer: Medicare Other | Admitting: Family Medicine

## 2015-06-01 ENCOUNTER — Encounter: Payer: Self-pay | Admitting: Family Medicine

## 2015-06-01 ENCOUNTER — Ambulatory Visit: Payer: Medicare Other | Admitting: Family Medicine

## 2015-06-01 VITALS — BP 124/70 | HR 78 | Temp 98.4°F | Resp 16 | Ht 70.0 in | Wt 251.0 lb

## 2015-06-01 DIAGNOSIS — E0840 Diabetes mellitus due to underlying condition with diabetic neuropathy, unspecified: Secondary | ICD-10-CM

## 2015-06-01 DIAGNOSIS — Z0000 Encounter for general adult medical examination without abnormal findings: Secondary | ICD-10-CM

## 2015-06-01 DIAGNOSIS — Z125 Encounter for screening for malignant neoplasm of prostate: Secondary | ICD-10-CM | POA: Diagnosis not present

## 2015-06-01 DIAGNOSIS — I10 Essential (primary) hypertension: Secondary | ICD-10-CM

## 2015-06-01 DIAGNOSIS — E0843 Diabetes mellitus due to underlying condition with diabetic autonomic (poly)neuropathy: Secondary | ICD-10-CM | POA: Diagnosis not present

## 2015-06-01 DIAGNOSIS — E669 Obesity, unspecified: Secondary | ICD-10-CM | POA: Diagnosis not present

## 2015-06-01 DIAGNOSIS — Z550 Illiteracy and low-level literacy: Secondary | ICD-10-CM | POA: Diagnosis not present

## 2015-06-01 DIAGNOSIS — E785 Hyperlipidemia, unspecified: Secondary | ICD-10-CM | POA: Diagnosis not present

## 2015-06-01 NOTE — Assessment & Plan Note (Signed)
We will get [facility] nurse to assist pt with stool cards for colon cancer screening and Flu shot

## 2015-06-01 NOTE — Assessment & Plan Note (Signed)
Well controlled 

## 2015-06-01 NOTE — Progress Notes (Signed)
Patient ID: Kevin [name], male   DOB: 1956/03/04, 59 y.o.   MRN: [id number]  Subjective:   Patient presents for Medicare Annual/Subsequent preventive examination.    Pt here for annual wellness exam. He has no particular concerns today. He is following with nutritionists as well as podiatrist and he has seen the eye doctor recently. He states that he is now using his Lantus as prescribed T typically takes it about 1 PM and he is injecting 10 units he states his blood sugar this morning was 1:30 he has not had any hypoglycemia but he did not bring his meter with him. We have [facility] working with him as there is difficulty with his compliance with medications as well as understanding. Review Past Medical/Family/Social: Per EMR   Risk Factors  Current exercise habits: None Dietary issues discussed: YES  Cardiac risk factors: Obesity (BMI >= 30 kg/m2). Diabetes mellitus   Depression Screen  (Note: if answer to either of the following is "Yes", a more complete depression screening is indicated)  Over the past two weeks, have you felt down, depressed or hopeless? No Over the past two weeks, have you felt little interest or pleasure in doing things? No Have you lost interest or pleasure in daily life? No Do you often feel hopeless? No Do you cry easily over simple problems? No   Activities of Daily Living  In your present state of health, do you have any difficulty performing the following activities?:  Driving? No  Managing money? yES   Feeding yourself? No  Getting from bed to chair? No  Climbing a flight of stairs?yES  Preparing food and eating?: yES  Bathing or showering? No  Getting dressed: No  Getting to the toilet? No  Using the toilet:No  Moving around from place to place: yes  In the past year have you fallen or had a near fall?:No  Are you sexually active? No  Do you have more than one partner? No   Hearing Difficulties: No  Do you often ask people to  speak up or repeat themselves? No  Do you experience ringing or noises in your ears? No Do you have difficulty understanding soft or whispered voices? No  Do you feel that you have a problem with memory? No Do you often misplace items? No  Do you feel safe at home? Yes  Cognitive Testing  Alert? Yes Normal Appearance?Yes  Oriented to person? Yes Place? Yes  Time? Yes  Recall of three objects? Yes  Can perform simple calculations? Yes  Displays appropriate judgment?Yes  Can read the correct time from a watch face?Yes   List the Names of Other Physician/Practitioners you currently use: Eye doctor , podiatry     Screening Tests / Date Colonoscopy  -declines                    Zostavax -age 51 Influenza Vaccine - Due in Sept, will have [facility] Provide due to transportation issues  Tetanus/tdap- unable to afford  ROS: GEN- denies fatigue, fever, weight loss,weakness, recent illness HEENT- denies eye drainage, change in vision, nasal discharge, CVS- denies chest pain, palpitations RESP- denies SOB, cough, wheeze ABD- denies N/V, change in stools, abd pain GU- denies dysuria, hematuria, dribbling, incontinence MSK- + joint pain, muscle aches, injury Neuro- denies headache, dizziness, syncope, seizure activity  Physical: GEN- NAD, alert and oriented x3 HEENT- PERRL, EOMI, non injected sclera, pink conjunctiva, MMM, oropharynx clear Neck- Supple, no thryomegaly CVS- RRR,  2/6 SEM RESP-CTAB ABD-NABS,soft,NT,ND EXT- trace pedal edema Pulses- Radial 2+, DP-1+      Assessment:    Annual wellness medicare exam   Plan:    During the course of the visit the patient was educated and counseled about appropriate screening and preventive services including:  Colorectal cancer screening - agrees to use a stool cards . Unable to afford co-pay for tetanus vaccine at this time Screen Neg  for depression. Diet review for nutrition referral? Yes ____ Not Indicated __x__  -- currently  seen  Patient Instructions (the written plan) was given to the patient.  Medicare Attestation  I have personally reviewed:  The patient's medical and social history  Their use of alcohol, tobacco or illicit drugs  Their current medications and supplements  The patient's functional ability including ADLs,fall risks, home safety risks, cognitive, and hearing and visual impairment  Diet and physical activities  Evidence for depression or mood disorders  The patient's weight, height, BMI, and visual acuity have been recorded in the chart. I have made referrals, counseling, and provided education to the patient based on review of the above and I have provided the patient with a written personalized care plan for preventive services.

## 2015-06-01 NOTE — Assessment & Plan Note (Signed)
Weight gain noted, he is still not adhering to diabetic diet and drinking too much soda

## 2015-06-01 NOTE — Assessment & Plan Note (Signed)
Diabetes has been uncontrolled he is now using his insulin I can tell from his abdominal exam he has been injecting goal is A1c less than 7%

## 2015-06-01 NOTE — Patient Instructions (Addendum)
Release of records- My Eye doctor- [place] Continue the insulin you can take it at bedtime Stool cards- return these to the office Continue current medications NO soda  F/U 3 months

## 2015-06-02 ENCOUNTER — Other Ambulatory Visit: Payer: Self-pay | Admitting: *Deleted

## 2015-06-02 LAB — LIPID PANEL
Cholesterol: 188 mg/dL (ref 125–200)
HDL: 33 mg/dL — ABNORMAL LOW (ref >=40)
LDL Cholesterol: 112 mg/dL (ref <130)
Total CHOL/HDL Ratio: 5.7 Ratio — ABNORMAL HIGH (ref <=5.0)
Triglycerides: 213 mg/dL — ABNORMAL HIGH (ref <150)
VLDL: 43 mg/dL — ABNORMAL HIGH (ref <30)

## 2015-06-02 LAB — COMPREHENSIVE METABOLIC PANEL
ALT: 39 U/L (ref 9–46)
AST: 36 U/L — ABNORMAL HIGH (ref 10–35)
Albumin: 4 g/dL (ref 3.6–5.1)
Alkaline Phosphatase: 76 U/L (ref 40–115)
BUN: 11 mg/dL (ref 7–25)
CO2: 24 mmol/L (ref 20–31)
Calcium: 9.1 mg/dL (ref 8.6–10.3)
Chloride: 101 mmol/L (ref 98–110)
Creat: 0.74 mg/dL (ref 0.70–1.33)
Glucose, Bld: 237 mg/dL — ABNORMAL HIGH (ref 70–99)
Potassium: 4.3 mmol/L (ref 3.5–5.3)
Sodium: 136 mmol/L (ref 135–146)
Total Bilirubin: 0.4 mg/dL (ref 0.2–1.2)
Total Protein: 7.6 g/dL (ref 6.1–8.1)

## 2015-06-02 LAB — MICROALBUMIN / CREATININE URINE RATIO
Creatinine, Urine: 79.3 mg/dL
Microalb Creat Ratio: 6.3 mg/g (ref 0.0–30.0)
Microalb, Ur: 0.5 mg/dL (ref <2.0)

## 2015-06-02 LAB — CBC WITH DIFFERENTIAL/PLATELET
Basophils Absolute: 0.1 10*3/uL (ref 0.0–0.1)
Basophils Relative: 1 % (ref 0–1)
Eosinophils Absolute: 0.4 10*3/uL (ref 0.0–0.7)
Eosinophils Relative: 4 % (ref 0–5)
HCT: 44.5 % (ref 39.0–52.0)
Hemoglobin: 15.3 g/dL (ref 13.0–17.0)
Lymphocytes Relative: 24 % (ref 12–46)
Lymphs Abs: 2.5 10*3/uL (ref 0.7–4.0)
MCH: 32.4 pg (ref 26.0–34.0)
MCHC: 34.4 g/dL (ref 30.0–36.0)
MCV: 94.3 fL (ref 78.0–100.0)
MPV: 10.5 fL (ref 8.6–12.4)
Monocytes Absolute: 0.8 10*3/uL (ref 0.1–1.0)
Monocytes Relative: 8 % (ref 3–12)
Neutro Abs: 6.6 10*3/uL (ref 1.7–7.7)
Neutrophils Relative %: 63 % (ref 43–77)
Platelets: 212 10*3/uL (ref 150–400)
RBC: 4.72 MIL/uL (ref 4.22–5.81)
RDW: 12.2 % (ref 11.5–15.5)
WBC: 10.5 10*3/uL (ref 4.0–10.5)

## 2015-06-02 LAB — PSA, MEDICARE: PSA: 0.23 ng/mL (ref <=4.00)

## 2015-06-02 LAB — HEMOGLOBIN A1C
Hgb A1c MFr Bld: 8.2 % — ABNORMAL HIGH (ref <5.7)
Mean Plasma Glucose: 189 mg/dL — ABNORMAL HIGH (ref <117)

## 2015-06-02 MED ORDER — INSULIN GLARGINE 300 UNIT/ML ~~LOC~~ SOPN: 20.0000 [IU] | PEN_INJECTOR | Freq: Every day | SUBCUTANEOUS | Status: DC

## 2015-06-02 MED ORDER — ATORVASTATIN CALCIUM 20 MG PO TABS: 20.0000 mg | ORAL_TABLET | Freq: Every day | ORAL | Status: DC

## 2015-06-04 ENCOUNTER — Other Ambulatory Visit: Payer: Self-pay | Admitting: *Deleted

## 2015-06-05 NOTE — Patient Outreach (Signed)
[facility]) Care Management  06/04/2015  MAXIM BEDEL 10-07-1956 [id number]   I had a brief visit with Mr. Heffner, his brother [name], and his CNA today at the request of Dr. Dorian Heckle office to follow up on medication management.   Mr. Ostermiller was advised by Dr. Buelah Manis to increase his Toujeo dose to 20u qhs. When I visited today, Mr. Herbers said he had increased the dose but again was still not taking his oral medications. I reviewed prescribed medications with him and stressed the importance of him taking ALL medications as prescribed, not just insulin. He related to me on my last visit that he thought maybe if he was taking insulin he really didn't have to take all his other medications any longer. He and Rusty told me they understood that Mr. Szumski had to take all of his prescribed medications. He is checking his cbg's as prescribed and indeed his cbg this morning was 232, quite a bit higher than 2 months ago. I will see Mr. Fridman again in 2 weeks to follow up on his progress.   In addition, I reviewed with Mr. Dolata again instructions for use of stool card as per Dr. Dorian Heckle request.    Janalyn Shy [facility] [place] [facility]  [phone]

## 2015-06-22 ENCOUNTER — Encounter: Payer: Medicare Other | Attending: Family Medicine | Admitting: Nutrition

## 2015-06-22 ENCOUNTER — Telehealth: Payer: Self-pay | Admitting: Nutrition

## 2015-06-22 ENCOUNTER — Encounter: Payer: Self-pay | Admitting: Nutrition

## 2015-06-22 VITALS — Ht 70.0 in | Wt 247.0 lb

## 2015-06-22 DIAGNOSIS — Z713 Dietary counseling and surveillance: Secondary | ICD-10-CM | POA: Insufficient documentation

## 2015-06-22 DIAGNOSIS — E118 Type 2 diabetes mellitus with unspecified complications: Secondary | ICD-10-CM | POA: Diagnosis not present

## 2015-06-22 DIAGNOSIS — E1165 Type 2 diabetes mellitus with hyperglycemia: Secondary | ICD-10-CM

## 2015-06-22 DIAGNOSIS — Z6836 Body mass index (BMI) 36.0-36.9, adult: Secondary | ICD-10-CM | POA: Insufficient documentation

## 2015-06-22 DIAGNOSIS — Z794 Long term (current) use of insulin: Secondary | ICD-10-CM | POA: Diagnosis not present

## 2015-06-22 DIAGNOSIS — IMO0002 Reserved for concepts with insufficient information to code with codable children: Secondary | ICD-10-CM

## 2015-06-22 NOTE — Telephone Encounter (Signed)
TC to remind of appt today. PC

## 2015-06-22 NOTE — Progress Notes (Signed)
  Medical Nutrition Therapy:  Appt start time: 1130end time:  1200.  Assessment:  Primary concerns today: Diabetes. HIs brother, Kevin [name] is here with him.  A1C down from 10% to 8.2%.Taking 20 units Toujeo daiily. His BS are up and down in the am; 160-220's in the am. Injects insulin in his stomach area. He has blister packs for all his oral pills. He notes he can see the 20 units on pen ok and dials it up accurately. Eating Healthy Choice meals for lunch and dinner. Has increased his walking some. Lost 4 lbs since last visit. Drinking water. No snacks. Has a nurse who comes out and checks on him and his medications. Doesn't have a lot of $ for fresh fruits and vegetables. Still smokes but not many he notes. Diet is low in fresh fruits and vegetables and whole grains.  Preferred Learning Style:    Auditory  Visual  Hands on  Limited reading and comprehension ability.  Learning Readiness:    Ready  Change in progress  MEDICATIONS: See list   DIETARY INTAKE:  24-hr recall:  B ( AM): Cherrios, Skim milk,  Water Snk ( AM): none  L ( PM): Healthy Choice meals. Apple, water  Snk ( PM):  D ( PM): Healthy Choice Meal, Water Snk ( PM): ,apple Beverages: water,  Usual physical activity: walks with cane and walks to mailbox but can't walk much further due to knee problems.  Estimated energy needs: 1800 calories 200 g carbohydrates 135 g protein 50 g fat  Progress Towards Goal(s):  In progress.   Nutritional Diagnosis:  NB-1.1 Food and nutrition-related knowledge deficit As related to Diabetes.  As evidenced by A1C 10.4%..    Intervention:  Nutrition counseling and diabetes education provided on the Plate Method, food choices to have per meal, portion sizes, target ranges for blood sugars, benefits of improving food choices, need to avoid high processed and high fat foods and benefits of weight loss. Stressed need to avoid junk food, cakes, cookies, pies, sweets, sodas, tea and  juices.  Goals: Keep up the good job!!  Follow Diabetes Meal Plan as instructed  Eat breakfast daily at 9 am.  Don't skip meals.  Take Toujeo at 9:30 pm every night..  Rotate location of where you inject your insulin in the stomach aread.  Avoid sugar free products other than sugar free jello.       Add salad or vegetables and a piece of fruit with lunch and dinner meals. Continue to drink water. Increase walking by walking to mail box twice a day..  Lose 3-4 lbs in 3 months.        Get A1C down to 7.5% or lower in three months.   Teaching Method Utilized:  Visual Auditory Hands on  Handouts given during visit include: The Plate Method Meal Plan [name]      Low Literacy Diabetes Instructions.  Barriers to learning/adherence to lifestyle change: none  Demonstrated degree of understanding via:  Teach Back   Monitoring/Evaluation:  Dietary intake, exercise, meal planning, SBG, and body weight in 3 month(s).

## 2015-06-22 NOTE — Patient Instructions (Signed)
Goals: Keep up the good job!!  Follow Diabetes Meal Plan as instructed  Eat breakfast daily at 9 am.  Don't skip meals.  Take Toujeo at 9:30 pm every night..  Rotate location of where you inject your insulin in the stomach aread.  Avoid sugar free products other than sugar free jello.       Add salad or vegetables and a piece of fruit with lunch and dinner meals. Continue to drink water. Increase walking by walking to mail box twice a day..  Lose 3-4 lbs in 3 months.        Get A1C down to 7.5% or lower in three months

## 2015-06-23 ENCOUNTER — Other Ambulatory Visit: Payer: Self-pay | Admitting: *Deleted

## 2015-06-23 NOTE — Patient Outreach (Signed)
[facility]) Care Management   06/23/2015  Kevin [name] 12-26-1955 [id number]  Kevin [name] is an 59 y.o. male who has been followed by [place] [facility] long term (> 2 years) for chronic disease management (HTN, DM, CAD, Hyperlipidemia) and community resources (LCSW: financial management assistance and transportation assistance and management). He is very intelligent but does have some literacy barriers. He lives at home with his step brother. He has extended family who visit infrequently. He is now seeing primary care, endocrinology, poidatry, and vision routinely. Prior to our involvement, Kevin [name] was not always seeing his providers.   The focus of our work has been on DM management. Kevin [name] has been seeing the dietician and is greatly improved in the area of dietary management. Kevin [name] is nonadherent to prescribed medications but making small strides.  Subjective: "Kevin [name] said I lost 4 pounds and my sugar is better!"  Objective:  BP 124/78 mmHg  Pulse 78  Ht 1.753 m (_0 )  Wt 247 lb (112.038 kg)  BMI 36.46 kg/m2  SpO2 98%  Review of Systems  Constitutional: Negative.   HENT: Negative.   Eyes: Negative.   Respiratory: Negative.   Cardiovascular: Negative.   Gastrointestinal: Negative.   Genitourinary: Negative.   Musculoskeletal: Positive for myalgias, back pain and joint pain. Negative for falls.  Skin: Negative.   Neurological: Negative.   Psychiatric/Behavioral: Negative.     Physical Exam  Constitutional: He is oriented to person, place, and time. Vital signs are normal. He appears well-developed and well-nourished. He is active.  Cardiovascular: Normal rate and regular rhythm.   Respiratory: Effort normal. He has decreased breath sounds in the right lower field and the left lower field. He has no wheezes. He has no rhonchi. He has no rales.  GI: Soft. Bowel sounds are normal.  Neurological: He is alert and oriented to  person, place, and time.  Skin: Skin is warm and dry.  Psychiatric: He has a normal mood and affect. His behavior is normal. Thought content normal. Cognition and memory are normal.    Current Medications:   Current Outpatient Prescriptions  Medication Sig Dispense Refill  . ACCU-CHEK AVIVA PLUS test strip USE TO CHECK BLOOD SUGAR ONCE DAILY. (Patient not taking: Reported on 05/19/2015) 50 each 11  . amLODipine (NORVASC) 5 MG tablet Take 1 tablet (5 mg total) by mouth daily. For blood pressure (Patient not taking: Reported on 05/19/2015) 30 tablet 6  . atorvastatin (LIPITOR) 20 MG tablet Take 1 tablet (20 mg total) by mouth daily. 30 tablet 6  . blood glucose meter kit and supplies KIT Dispense based on patient and insurance preference. Use to monitor FSBS 1 daily. Dx: E11.8 1 each 0  . cetirizine (ZYRTEC) 10 MG tablet Take 1 tablet (10 mg total) by mouth daily. For allergies (Patient not taking: Reported on 05/19/2015) 30 tablet 6  . clotrimazole-betamethasone (LOTRISONE) cream Apply 1 application topically 2 (two) times daily. (Patient not taking: Reported on 05/19/2015) 30 g 6  . diclofenac (VOLTAREN) 75 MG EC tablet TAKE (1) TABLET BY MOUTH TWICE DAILY FOR INFLAMMATION. (Patient not taking: Reported on 05/19/2015) 60 tablet 6  . diclofenac sodium (VOLTAREN) 1 % GEL Apply to knee three times a day as needed (Patient not taking: Reported on 05/19/2015) 1 Tube 6  . FLUoxetine (PROZAC) 20 MG capsule Take 1 capsule (20 mg total) by mouth daily. For your nerves (Patient not taking: Reported on 05/19/2015) 30 capsule 6  .  folic acid (FOLVITE) 1 MG tablet Take 1 tablet (1 mg total) by mouth daily. (Patient not taking: Reported on 05/19/2015) 30 tablet 6  . furosemide (LASIX) 40 MG tablet Take 1 tablet (40 mg total) by mouth 2 (two) times daily. For fluid (Patient not taking: Reported on 05/19/2015) 60 tablet 6  . HYDROcodone-acetaminophen (NORCO/VICODIN) 5-325 MG per tablet Take 1 tablet by mouth 2 (two) times daily as  needed. 60 tablet 0  . Insulin Glargine (TOUJEO SOLOSTAR) 300 UNIT/ML SOPN Inject 20 Units into the skin at bedtime. 3 pen 11  . Insulin Pen Needle 29G X 12.7MM MISC Use as directed 100 each 11  . metFORMIN (GLUCOPHAGE) 1000 MG tablet Take 1 tablet (1,000 mg total) by mouth 2 (two) times daily with a meal. For diabetes (Patient not taking: Reported on 05/19/2015) 60 tablet 6  . nitroGLYCERIN (NITROSTAT) 0.4 MG SL tablet Place 1 tablet under the tongue every 5 minutes as needed for chest pain x3. If not resolved after 3, go to ER. (Patient not taking: Reported on 05/19/2015) 30 tablet 6  . potassium chloride (K-DUR) 10 MEQ tablet Take 1 tablet (10 mEq total) by mouth 2 (two) times daily. Take with potassium (Patient not taking: Reported on 05/19/2015) 60 tablet 6  . sitaGLIPtin (JANUVIA) 100 MG tablet Take 1 tablet (100 mg total) by mouth daily. (Patient not taking: Reported on 05/19/2015) 30 tablet 6   No current facility-administered medications for this visit.    Functional Status:   In your present state of health, do you have any difficulty performing the following activities: 05/19/2015 04/21/2015  Hearing? N N  Vision? N -  Difficulty concentrating or making decisions? N N  Walking or climbing stairs? Y Y  Dressing or bathing? Y Y  Doing errands, shopping? Y Y  Conservation officer, nature and eating ? - N  Using the Toilet? - N  In the past six months, have you accidently leaked urine? - N  Do you have problems with loss of bowel control? - N  Managing your Medications? - N  Managing your Finances? - N  Housekeeping or managing your Housekeeping? - Y    Fall/Depression Screening:    PHQ 2/9 Scores 06/22/2015 04/21/2015 01/08/2015 11/17/2014 02/17/2014  PHQ - 2 Score 0 0 0 0 2  PHQ- 9 Score - - - - 6    Assessment:    Diabetes - poorly managed by patient but improving adherence to prescribed diet and seeing [name] RD, CDE routinely; he was showing worsening adherence to medication regimen but did  show some improvement this month; last month I found A FULL MONTHS SUPPLY MINUS 2 DAYS of medications in Kevin [name] blister packs from [facility]; he told me he felt like the insulin was working so well that he didn't think he needed to take the other medicines and he was afraid they would "act against each other"; I reviewed with him and his brother [name] how his cbg's had worsened over the previous month as a result of his not taking his medications as prescribed he is taking Insulin Glargine (TOUJEO SOLOSTAR) 300 UNIT/ML 20u daily at bedtime (9:30pm) as prescribed:   I spent a good deal of time reviewing in detail medications, rationale for each, prescribing instructions, blister pack instuctions, and importance of adherence.    7 day average: 3 months ago 137 (n = 9) / 2 month ago = 179 (N=5)/ 1 month ago= 243/ (N=5) TODAY 229 14 day average:3 months ago  159 (n = 13) / 2 month ago=172 (N=11)/1 month ago= 227/(N=9) TODAY 214 30 day average: 3 months ago 172 (n = 25) / 2 month ago = 158 (N=30)/1 month ago= 212/(N=26)TODAY 238  See note excerpt from [name] RD/CDE below:  "Primary concerns today: Diabetes. HIs brother, [name] is here with him. A1C down from 10% to 8.2%.Taking 20 units Toujeo daiily. His BS are up and down in the am; 160-220's in the am. Injects insulin in his stomach area. He has blister packs for all his oral pills. He notes he can see the 20 units on pen ok and dials it up accurately. Eating Healthy Choice meals for lunch and dinner. Has increased his walking some. Lost 4 lbs since last visit. Drinking water. No snacks. Has a nurse who comes out and checks on him and his medications. Doesn't have a lot of $ for fresh fruits and vegetables. Still smokes but not many he notes. Diet is low in fresh fruits and vegetables and whole grains."  Medication Concerns - Mr. Grealish says he has been taking Hydrocodone/Acetaminophen 5-325 at night to help his foot/leg spasms and arm pain  and this is the only thing that eases his discomfort enough so that he can sleep. Ambulating with cane. No falls.   Plan:  Mr. Cogbill will continue daily cbg, bp, pulse, O2 sat checks, and weights and will record.   Mr. Bianchini will continue to take medications, including insulin, as prescribed.   Mr. Rosser will engage in walking program, striving for 10 minute walks 4 times/week.  I will see Mr. Garrelts at home again in October. He wishes to receive a flu shot at that time and I will administer as per order.   [facility] CM Care Plan Problem Two        Most Recent Value   Care Plan Problem Two  poor blood sugar control   Role Documenting the Problem Two  Care Management [place] for Problem Two  Active   Interventions for Problem Two Long Term Goal   utilizing teachback method, reviewed with patient and brother the importance of maintaining adherence to diabetes plan of care   [facility] Long Term Goal (31-90) days  patient will note improvement in cbg's and HgA1C over the next 90 days as evidenced by lab data/documentation   [facility] Long Term Goal Start Date  05/19/15   [facility] Long Term Goal Met Date  -- [did not meet previous month]   [facility] CM Short Term Goal #1 (0-30 days)  over the next 30 days patient will verbalize understanding of importance of medication adherence, including insulin   [facility] CM Short Term Goal #1 Start Date  05/19/15   [facility] CM Short Term Goal #1 Met Date   06/23/15   Interventions for Short Term Goal #2   utilizing teachback method, discussed with patient rationale for use of oral diabetes medications AND insulin for treatment of diabetes   [facility] CM Short Term Goal #2 (0-30 days)  over the next 30 days patient will check cbg's daily and record, calling provider for cbg <70 or > 250   [facility] CM Short Term Goal #2 Start Date  05/19/15 [did not meet previous month]   [facility] CM Short Term Goal #2 Met Date  06/23/15   Interventions for Short Term Goal #2  utilizing teachback method,  instructed patient about importance of daily cbg checks and reporting cbg's <90 or > 300   [facility] CM  Short Term Goal #3 (0-30 days)  Over the next 30 days, patient will walk for exercise at least 3 days/week x 75mn   [facility] CM Short Term Goal #3 Start Date  06/23/15    [facility] Problem [facility]        Most Recent Value   Care Plan Problem Three  Medication Non Adherence   Role Documenting the Problem Three  Care Management Coordinator   Care Plan for Problem Three  Active   [facility]N Long Term Goal (31-90) days  over the next 31 days patient will demonstrate improvement in medication adherence as evidenced by pill counts and insulin syringe review   [facility] Long Term Goal Start Date  05/19/15   [facility]  [facility]   Interventions for Problem Three Long Term Goal  utilizing teachback method and demonstration, reviewed new medication and prescribing information with patient   [facility] CM Short Term Goal #1 (0-30 days)  patient will verbalize understanding of importance of adhering daily to his prescribed insulin regimen over the next 30 days   [facility] CM Short Term Goal #1 Start Date  05/19/15   [facility]  06/[facility]15   Interventions for Short Term Goal #1  utilizating teachback method, reviewed rationale for use of insulin with patient   [facility]N CM Short Term Goal #2 (0-30 days)  patient will take oral medications as prescribed at least 50% of the time over the next 30 days as evidenced by blister pack pill count   [facility]N CM Short Term Goal #2 Start Date  05/19/15   [facility] Met Date  06/23/15   Interventions for Short Term Goal #2  utilizing teachback method, reviewed medication blister pack use and vital importance of taking medications as prescribed       [place]Management  [phone]

## 2015-07-22 ENCOUNTER — Other Ambulatory Visit: Payer: Self-pay | Admitting: *Deleted

## 2015-07-22 NOTE — Patient Outreach (Signed)
[facility]) Care Management   07/22/2015  [name] 1956-05-10 [id number]  Kevin [name] is an 59 y.o. male  who has been followed by [facility] long term (> 2 years) for chronic disease management (HTN, DM, CAD, Hyperlipidemia) and community resources (LCSW: financial management assistance and transportation assistance and management). He is very intelligent but does have some literacy barriers. He lives at home with his step brother. He has extended family who visit infrequently. He is now seeing primary care, endocrinology, poidatry, and vision routinely. Prior to our involvement, [name] was not always seeing his providers.   The focus of our work has been on DM management. [name] has been seeing the dietician and is greatly improved in the area of dietary management. Mr. Knecht is nonadherent to prescribed medications but making small strides.  Today, [name]'s primary complaint is pain in the left neck and wrist. This is an acute/chronic problem.   Subjective: "My hand and my arm really hurts if I move it much."  Objective:   Review of Systems  Constitutional: Negative.   HENT: Negative.   Eyes: Negative.   Respiratory: Negative.   Cardiovascular: Negative.   Gastrointestinal: Negative.   Genitourinary: Negative.   Musculoskeletal: Positive for myalgias and joint pain. Negative for falls.  Skin: Negative.   Neurological: Positive for focal weakness.       Left arm/wrist painful and weaker  Psychiatric/Behavioral: Negative.     Physical Exam  Constitutional: He is oriented to person, place, and time. Vital signs are normal. He appears well-developed and well-nourished. He is active.  Cardiovascular: Normal rate and regular rhythm.   Respiratory: Effort normal. He has decreased breath sounds in the right upper field, the right middle field, the right lower field, the left upper field, the left middle field and the left lower  field. He has no wheezes. He has no rhonchi. He has no rales.  GI: Soft. Bowel sounds are normal.  Musculoskeletal: He exhibits tenderness.  Left wrist  Neurological: He is alert and oriented to person, place, and time.  Skin: Skin is warm, dry and intact.  Psychiatric: He has a normal mood and affect. His behavior is normal. Judgment and thought content normal. Cognition and memory are normal.    Current Medications:   Current Outpatient Prescriptions  Medication Sig Dispense Refill  . ACCU-CHEK AVIVA PLUS test strip USE TO CHECK BLOOD SUGAR ONCE DAILY. 50 each 11  . amLODipine (NORVASC) 5 MG tablet Take 1 tablet (5 mg total) by mouth daily. For blood pressure 30 tablet 6  . atorvastatin (LIPITOR) 20 MG tablet Take 1 tablet (20 mg total) by mouth daily. 30 tablet 6  . blood glucose meter kit and supplies KIT Dispense based on patient and insurance preference. Use to monitor FSBS 1 daily. Dx: E11.8 1 each 0  . cetirizine (ZYRTEC) 10 MG tablet Take 1 tablet (10 mg total) by mouth daily. For allergies 30 tablet 6  . clotrimazole-betamethasone (LOTRISONE) cream Apply 1 application topically 2 (two) times daily. 30 g 6  . diclofenac (VOLTAREN) 75 MG EC tablet TAKE (1) TABLET BY MOUTH TWICE DAILY FOR INFLAMMATION. 60 tablet 6  . diclofenac sodium (VOLTAREN) 1 % GEL Apply to knee three times a day as needed 1 Tube 6  . FLUoxetine (PROZAC) 20 MG capsule Take 1 capsule (20 mg total) by mouth daily. For your nerves 30 capsule 6  . folic acid (FOLVITE) 1 MG tablet Take 1  tablet (1 mg total) by mouth daily. 30 tablet 6  . furosemide (LASIX) 40 MG tablet Take 1 tablet (40 mg total) by mouth 2 (two) times daily. For fluid 60 tablet 6  . HYDROcodone-acetaminophen (NORCO/VICODIN) 5-325 MG per tablet Take 1 tablet by mouth 2 (two) times daily as needed. 60 tablet 0  . Insulin Glargine (TOUJEO SOLOSTAR) 300 UNIT/ML SOPN Inject 20 Units into the skin at bedtime. 3 pen 11  . Insulin Pen Needle 29G X 12.7MM  MISC Use as directed 100 each 11  . metFORMIN (GLUCOPHAGE) 1000 MG tablet Take 1 tablet (1,000 mg total) by mouth 2 (two) times daily with a meal. For diabetes 60 tablet 6  . nitroGLYCERIN (NITROSTAT) 0.4 MG SL tablet Place 1 tablet under the tongue every 5 minutes as needed for chest pain x3. If not resolved after 3, go to ER. 30 tablet 6  . potassium chloride (K-DUR) 10 MEQ tablet Take 1 tablet (10 mEq total) by mouth 2 (two) times daily. Take with potassium 60 tablet 6  . sitaGLIPtin (JANUVIA) 100 MG tablet Take 1 tablet (100 mg total) by mouth daily. 30 tablet 6   No current facility-administered medications for this visit.    Functional Status:   In your present state of health, do you have any difficulty performing the following activities: 05/19/2015 04/21/2015  Hearing? N N  Vision? N -  Difficulty concentrating or making decisions? N N  Walking or climbing stairs? Y Y  Dressing or bathing? Y Y  Doing errands, shopping? Y Y  Conservation officer, nature and eating ? - N  Using the Toilet? - N  In the past six months, have you accidently leaked urine? - N  Do you have problems with loss of bowel control? - N  Managing your Medications? - N  Managing your Finances? - N  Housekeeping or managing your Housekeeping? - Y    Fall/Depression Screening:    PHQ 2/9 Scores 06/22/2015 04/21/2015 01/08/2015 11/17/2014 02/17/2014  PHQ - 2 Score 0 0 0 0 2  PHQ- 9 Score - - - - 6    Assessment:    Acute Health Condition - [name] says he began having pain in left wrist with movement beginning yesterday morning. He also notes discomfort in the left neck but says the wrist/hand pain is worse. He has applied a soft brace that he says helps him keep from moving the affected extremity. [name] says his pain "eases a little" when he takes his pain pills.   Diabetes - poorly managed by patient but improving adherence to prescribed diet and seeing [name] RD, CDE routinely; he was showing worsening  adherence to medication regimen but did show some improvement this month; last month I found A FULL MONTHS SUPPLY MINUS 2 DAYS of medications in Mr. Emig blister packs from [facility]; he told me he felt like the insulin was working so well that he didn't think he needed to take the other medicines and he was afraid they would "act against each other"; I reviewed with him and his brother [name] how his cbg's had worsened over the previous month as a result of his not taking his medications as prescribed he is taking Insulin Glargine (TOUJEO SOLOSTAR) 300 UNIT/ML 20u daily at bedtime (9:30pm) as prescribed:   I spent a good deal of time reviewing in detail medications, rationale for each, prescribing instructions, blister pack instuctions, and importance of adherence.    7 day average: 4 months ago  137 (n = 9) / 3 month ago = 179 (N=5)/ 2 months ago= 243/ (N=5) 229/ TODAY - no 7 day sticks 14 day average:4 months ago 159 (n = 13) / 3 month ago=172 (N=11)/2 months ago= 227/(N=9) 214/ TODAY - no 14 day sticks 30 day average: 4 months ago 172 (n = 25) / 3 month ago = 158 (N=30)/2 months ago= 212/(N=26) 238/ TODAY - 30 day 58  I encouraged Mr. Randle to check his cbg every day! He has NO STICKS for 7 and 14 day averages. I reviewed rationale for daily cbg monitoring.   Medication Concerns - Mr. Sparr says he has been taking Hydrocodone/Acetaminophen 5-325 at night to help his foot/leg spasms and arm pain and this is the only thing that eases his discomfort enough so that he can sleep. Ambulating with cane. No falls.   Plan:  Mr. Ambroise will continue daily cbg, bp, pulse, O2 sat checks, and weights and will record.   Mr. Hue will continue to take medications, including insulin, as prescribed.   Mr. Maddocks will engage in walking program, striving for 10 minute walks 4 times/week.  I will see Mr. Sumlin at home again in November. He wishes to receive a flu shot and I will bring it to him  tomorrow.   [facility] CM Care Plan Problem Two        Most Recent Value   Care Plan Problem Two  poor blood sugar control   Role Documenting the Problem Two  Care Management [place] for Problem Two  Active   Interventions for Problem Two Long Term Goal   utilizing teachback method, reviewed with patient and brother the importance of maintaining adherence to diabetes plan of care   [facility]N Long Term Goal (31-90) days  patient will note improvement in cbg's and HgA1C over the next 90 days as evidenced by lab data/documentation   THN Long Term Goal Start Date  05/19/15   [facility] Long Term Goal Met Date  -- [did not meet previous month]   [facility]N CM Short Term Goal #1 (0-30 days)  Over the next 7 days patient will recieve annual flu immunization   [facility] CM Short Term Goal #1 Start Date  07/22/15   Interventions for Short Term Goal #2   utilizing teachback method, reviewed rationale for flu shot especially in diabetic patients,  planned visit for immunization administration   [facility]N CM Short Term Goal #2 (0-30 days)  over the next 30 days patient will check cbg's daily and record, calling provider for cbg <70 or > 250   [facility]N CM Short Term Goal #2 Start Date  07/22/15   Interventions for Short Term Goal #2  utilizing teachback method, reviewed with patient rationale for and importance of checking cbg's daily   THN CM Short Term Goal #3 (0-30 days)  Over the next 30 days, patient will walk for exercise at least 3 days/week x 31mn   THN CM Short Term Goal #3 Start Date  07/22/15 [Barrie Folknot met previous month,  re-established]      AB[facility]altManagement  ([phone]

## 2015-07-28 ENCOUNTER — Other Ambulatory Visit: Payer: Self-pay | Admitting: Licensed Clinical Social Worker

## 2015-07-28 NOTE — Patient Outreach (Signed)
[name]) Care Management  [facility] Social Work  07/28/2015  Kevin [name] [birth year] [id number]  Subjective:    Objective:   Current Medications:  Current Outpatient Prescriptions  Medication Sig Dispense Refill  . ACCU-CHEK AVIVA PLUS test strip USE TO CHECK BLOOD SUGAR ONCE DAILY. 50 each 11  . amLODipine (NORVASC) 5 MG tablet Take 1 tablet (5 mg total) by mouth daily. For blood pressure 30 tablet 6  . atorvastatin (LIPITOR) 20 MG tablet Take 1 tablet (20 mg total) by mouth daily. 30 tablet 6  . blood glucose meter kit and supplies KIT Dispense based on patient and insurance preference. Use to monitor FSBS 1 daily. Dx: E11.8 1 each 0  . cetirizine (ZYRTEC) 10 MG tablet Take 1 tablet (10 mg total) by mouth daily. For allergies 30 tablet 6  . clotrimazole-betamethasone (LOTRISONE) cream Apply 1 application topically 2 (two) times daily. 30 g 6  . diclofenac (VOLTAREN) 75 MG EC tablet TAKE (1) TABLET BY MOUTH TWICE DAILY FOR INFLAMMATION. 60 tablet 6  . diclofenac sodium (VOLTAREN) 1 % GEL Apply to knee three times Kevin day as needed 1 Tube 6  . FLUoxetine (PROZAC) 20 MG capsule Take 1 capsule (20 mg total) by mouth daily. For your nerves 30 capsule 6  . folic acid (FOLVITE) 1 MG tablet Take 1 tablet (1 mg total) by mouth daily. 30 tablet 6  . furosemide (LASIX) 40 MG tablet Take 1 tablet (40 mg total) by mouth 2 (two) times daily. For fluid 60 tablet 6  . HYDROcodone-acetaminophen (NORCO/VICODIN) 5-325 MG per tablet Take 1 tablet by mouth 2 (two) times daily as needed. 60 tablet 0  . Insulin Glargine (TOUJEO SOLOSTAR) 300 UNIT/ML SOPN Inject 20 Units into the skin at bedtime. 3 pen 11  . Insulin Pen Needle 29G X 12.7MM MISC Use as directed 100 each 11  . metFORMIN (GLUCOPHAGE) 1000 MG tablet Take 1 tablet (1,000 mg total) by mouth 2 (two) times daily with Kevin meal. For diabetes 60 tablet 6  . nitroGLYCERIN (NITROSTAT) 0.4 MG SL tablet Place 1 tablet under the tongue  every 5 minutes as needed for chest pain x3. If not resolved after 3, go to ER. 30 tablet 6  . potassium chloride (K-DUR) 10 MEQ tablet Take 1 tablet (10 mEq total) by mouth 2 (two) times daily. Take with potassium 60 tablet 6  . sitaGLIPtin (JANUVIA) 100 MG tablet Take 1 tablet (100 mg total) by mouth daily. 30 tablet 6   No current facility-administered medications for this visit.    Functional Status:  In your present state of health, do you have any difficulty performing the following activities: 05/19/2015 04/21/2015  Hearing? N N  Vision? N -  Difficulty concentrating or making decisions? N N  Walking or climbing stairs? Y Y  Dressing or bathing? Y Y  Doing errands, shopping? Y Y  Conservation officer, nature and eating ? - N  Using the Toilet? - N  In the past six months, have you accidently leaked urine? - N  Do you have problems with loss of bowel control? - N  Managing your Medications? - N  Managing your Finances? - N  Housekeeping or managing your Housekeeping? - Y    Fall/Depression Screening:  PHQ 2/9 Scores 06/22/2015 04/21/2015 01/08/2015 11/17/2014 02/17/2014  PHQ - 2 Score 0 0 0 0 2  PHQ- 9 Score - - - - 6    Assessment:  CSW met with client on 07/28/15 at  home of client for Kevin routine home visit. Client said he had been eating adequately and client said  he has his prescribed medications. He said he had been taking his medications as prescribed. CSW gave client the following EMMI handouts for client to read:  "Stress Management"; "Making Everyday Tasks Easier "; "Arthritis"; "Osteoarthritis"; "Fatigue"; and "Learning to Manage Stress". Client was appreciative of EMMI handouts received from Kevin [name].  Client said he had taken his last pill of prescribed hydrocodone.  CSW called RN Kevin [name] on 07/28/15 to inform RN that client had taken his last dose of prescribed hydrocodone.Client said that his next appointment to see Dr. Buelah [name] is on 09/07/15.  RN Kevin [name] communicated with CSW on  07/28/15 and informed CSW that client would need to go to an appointment with Dr. Buelah [name] to request Kevin prescription for refill for hydrocodone requested by client. CSW shared this information regarding medication refill for client from RN with Kevin [name] on 07/28/15.  Kevin [name] said he has been sleeping well. CSW gave client Kevin [name] tickets  for 3 Kevin [name] in town (Kevin [name], Kevin [name] transports) and gave client Kevin [name] tickets for Kevin [name] transports. Kevin [name] said he will go to an appointment with Kevin [name] on 08/03/15. Client said he thinks visits with Kevin [name] have been very helpful to him.  Client said he goes two times monthlly to Kevin [name] [name] to obtain needed food items. Client said he wears his diabetic shoes when he goes into the community.  He said he walks well with these diabetic shoes.  He uses Kevin cane to ambulate.  He makes his transport arrangements for medical appointments through Kevin [name] and Kevin [name] transport agencies.  Client said his sister is supportive and will visit with him at his home on 07/29/15.  Client said he is doing well with activities of daily living.  Client has home health aide who assists him weekly as scheduled through local home health agency.  Client said he enjoys help he receives through home health aide.  Client plans to use Kevin [name] transport support to go to and from his medical appointment with Kevin [name] on 08/03/15.  Client said his left hand his doing well. He said he is using Kevin cream on left hand and it is working well with reducing pain in left hand. CSW informed RN Kevin [name] of fact that client is using Kevin cream on his left hand to help reduce pain.  Client said he is doing adequately financially.  CSW gave client Kevin [name] CSW card and invited client to call CSW at [phone] as needed to address social work needs of client. CSW thanked client for home visit with client on 07/28/15.    Plan:  Client to  attend scheduled medical appointments in next 30 days. Client to take medications as prescribed. CSW to collaborate with RN Kevin [name] to monitor needs of client. CSW to call client in three weeks to assess needs of client.  Norva Riffle.Diago Haik MSW, LCSW Licensed Clinical Social Worker [name] Care Management [phone]

## 2015-08-03 ENCOUNTER — Encounter: Payer: Medicare Other | Attending: "Endocrinology | Admitting: Nutrition

## 2015-08-03 ENCOUNTER — Encounter: Payer: Self-pay | Admitting: Nutrition

## 2015-08-03 VITALS — Ht 70.0 in | Wt 248.6 lb

## 2015-08-03 DIAGNOSIS — E118 Type 2 diabetes mellitus with unspecified complications: Principal | ICD-10-CM

## 2015-08-03 DIAGNOSIS — E1165 Type 2 diabetes mellitus with hyperglycemia: Secondary | ICD-10-CM

## 2015-08-03 DIAGNOSIS — Z794 Long term (current) use of insulin: Principal | ICD-10-CM

## 2015-08-03 DIAGNOSIS — E669 Obesity, unspecified: Secondary | ICD-10-CM

## 2015-08-03 DIAGNOSIS — IMO0002 Reserved for concepts with insufficient information to code with codable children: Secondary | ICD-10-CM

## 2015-08-03 NOTE — Progress Notes (Signed)
Medical Nutrition Therapy:  Appt start time: 1130end time:  1200.  Assessment:  Primary concerns today: Diabetes. HIs brother, [name] is here with him.  A1C down from 10% to 8.2%. He reports he has been taking his insulin as prescribed. Eating healthy choice or smart ones for his meals. Compliance with diet and medications varies. Now has a nurse who checks on his medications weekly. Still smoking a few cigarrettes a day. Goes out to eat once a month or so to an [place] [employer] [place]. Not able to exercise much. Weight went up 1 lb. BS appar to be dong beter with improved A1C. Needs to be careful with portion sizes. Prepared meals seem to be helping his blood sugars. Stays up late and sleeps in. Meal times are off schedule impacting his blood sugars. Says he doesn't sleep well due to leg pain. Diet still low in fresh fruits and vegetables. No one in the home cooks which is another problem. Takes 20 units of Toujeo daily in abdomen.  Wt Readings from Last 3 Encounters:  08/03/15 248 lb 9.6 oz (112.764 kg)  06/23/15 247 lb (112.038 kg)  06/22/15 247 lb (112.038 kg)   Ht Readings from Last 3 Encounters:  08/03/15 5\' 10"  (1.778 m)  06/23/15 5\' 9"  (1.753 m)  06/22/15 5\' 10"  (1.778 m)   Body mass index is 35.67 kg/(m^2).     Lab Results  Component Value Date   HGBA1C 8.2* 06/01/2015   Preferred Learning Style:    Auditory  Visual  Hands on  Limited reading and comprehension ability.  Learning Readiness:    Ready  Change in progress  MEDICATIONS: See list   DIETARY INTAKE:  24-hr recall:  B ( AM): Cherrios, Skim milk,  Water Snk ( AM): none  L ( PM): Healthy Choice meals. Apple, water  Snk ( PM):  D ( PM): Hamburger, bun, water Snk ( PM): none Beverages: water,  Usual physical activity: walks with cane and walks to mailbox but can't walk much further due to knee problems.  Estimated energy needs: 1800 calories 200 g carbohydrates 135 g protein 50 g fat  Progress  Towards Goal(s):  In progress.   Nutritional Diagnosis:  NB-1.1 Food and nutrition-related knowledge deficit As related to Diabetes.  As evidenced by A1C 10.4%..    Intervention:  Nutrition counseling and diabetes education provided on the Plate Method, food choices to have per meal, portion sizes, target ranges for blood sugars, benefits of improving food choices, need to avoid high processed and high fat foods and benefits of weight loss. Stressed need to avoid junk food, cakes, cookies, pies, sweets, sodas, tea and juices. Reviewed meal planning.  Goals:   Follow Diabetes Meal Plan as instructed  Eat breakfast daily at 9 am.  Don't skip meals.  Take  20 units ofToujeo at 9:30 pm every night...  Avoid sugar free products other than sugar free jello.       Add salad or vegetables and a piece of fruit with lunch and dinner meals. Continue to drink water. Increase walking by walking to mail box twice a day.. Avoid poptarts  Lose 3-4 lbs in 3 months.        Get A1C down to 7.5% or lower in three months.   Teaching Method Utilized:  Visual Auditory Hands on  Handouts given during visit include: The Plate Method Meal Plan Card      Low Literacy Diabetes Instructions.  Barriers to learning/adherence to lifestyle change:  none  Demonstrated degree of understanding via:  Teach Back   Monitoring/Evaluation:  Dietary intake, exercise, meal planning, SBG, and body weight in 3 month(s).

## 2015-08-03 NOTE — Patient Instructions (Addendum)
Goals:   Follow Diabetes Meal Plan as instructed  Eat breakfast daily at 9 am.  Test blood sugar every am at 9-930 am.  Don't skip meals.  Take  20 units ofToujeo at 9:30 pm every night...  Avoid sugar free products other than sugar free jello.       Add salad or vegetables and a piece of fruit with lunch and dinner meals. Continue to drink water. Increase walking by walking to mail box twice a day.. Avoid poptarts  Lose 3-4 lbs in 3 months.        Get A1C down to 7.5% or lower in three months

## 2015-08-25 ENCOUNTER — Other Ambulatory Visit: Payer: Self-pay | Admitting: *Deleted

## 2015-08-25 ENCOUNTER — Encounter: Payer: Self-pay | Admitting: *Deleted

## 2015-08-25 NOTE — Patient Outreach (Signed)
[place] [facility]) Care Management   08/25/2015  AVYAY COGER 04/09/1956 [id number]  KENAI FLUEGEL is an 59 y.o. male who has been followed by [place] [facility] long term (> 2 years) for chronic disease management (HTN, DM, CAD, Hyperlipidemia) and community resources (LCSW: financial management assistance and transportation assistance and management). He is very intelligent but does have some literacy barriers. He lives at home with his step brother. He has extended family who visit infrequently. He is now seeing primary care, endocrinology, poidatry, and vision routinely. Prior to our involvement, Mr. Rotan was not always seeing his providers.   The focus of our work has been on DM management. Mr. Dorrance has been seeing the dietician and is greatly improved in the area of dietary management. Mr. Dunnaway is nonadherent to prescribed medications but making small strides.  Today, Mr.Terpening is concerned that his cbg's are higher than they have been in the past weeks. Please see detailed notes in assessment below.   Subjective: "I don't understand why my sugar is up today. I had half of a diet meal last night and I didn't eat yet this morning."  Objective:  BP 110/80 mmHg  Pulse 77  Ht 1.727 m (_0 )  Wt 247 lb (112.038 kg)  BMI 37.56 kg/m2  SpO2 98%  Review of Systems  Constitutional: Negative.   HENT: Negative.   Eyes: Negative.   Respiratory: Negative.   Cardiovascular: Negative.   Gastrointestinal: Negative.   Genitourinary: Negative.   Musculoskeletal: Positive for myalgias and joint pain. Negative for falls.  Skin: Negative.   Neurological: Positive for tingling.       Persistent tingling/burning left arm/hand; intermittent tingling/burning soles of both feet  Psychiatric/Behavioral: Negative.     Physical Exam  Constitutional: He is oriented to person, place, and time. Vital signs are normal. He appears well-developed and well-nourished. He is  active.  Cardiovascular: Normal rate, regular rhythm and normal heart sounds.   Respiratory: Effort normal. He has decreased breath sounds in the right lower field and the left lower field. He has no wheezes. He has no rhonchi. He has no rales.  GI: Soft. Bowel sounds are normal.  Neurological: He is alert and oriented to person, place, and time.  Skin: Skin is warm and dry.  Psychiatric: He has a normal mood and affect. His speech is normal and behavior is normal. Judgment and thought content normal. Cognition and memory are normal.    Current Medications:   Current Outpatient Prescriptions  Medication Sig Dispense Refill  . ACCU-CHEK AVIVA PLUS test strip USE TO CHECK BLOOD SUGAR ONCE DAILY. 50 each 11  . atorvastatin (LIPITOR) 20 MG tablet Take 1 tablet (20 mg total) by mouth daily. 30 tablet 6  . blood glucose meter kit and supplies KIT Dispense based on patient and insurance preference. Use to monitor FSBS 1 daily. Dx: E11.8 1 each 0  . cetirizine (ZYRTEC) 10 MG tablet Take 1 tablet (10 mg total) by mouth daily. For allergies 30 tablet 6  . diclofenac (VOLTAREN) 75 MG EC tablet TAKE (1) TABLET BY MOUTH TWICE DAILY FOR INFLAMMATION. 60 tablet 6  . diclofenac sodium (VOLTAREN) 1 % GEL Apply to knee three times a day as needed 1 Tube 6  . folic acid (FOLVITE) 1 MG tablet Take 1 tablet (1 mg total) by mouth daily. 30 tablet 6  . HYDROcodone-acetaminophen (NORCO/VICODIN) 5-325 MG per tablet Take 1 tablet by mouth 2 (two) times daily as needed. [place]  tablet 0  . Insulin Glargine (TOUJEO SOLOSTAR) 300 UNIT/ML SOPN Inject 20 Units into the skin at bedtime. 3 pen 11  . Insulin Pen Needle 29G X 12.7MM MISC Use as directed 100 each 11  . nitroGLYCERIN (NITROSTAT) 0.4 MG SL tablet Place 1 tablet under the tongue every 5 minutes as needed for chest pain x3. If not resolved after 3, go to ER. 30 tablet 6  . potassium chloride (K-DUR) 10 MEQ tablet Take 1 tablet (10 mEq total) by mouth 2 (two) times daily.  Take with potassium 60 tablet 6  . sitaGLIPtin (JANUVIA) 100 MG tablet Take 1 tablet (100 mg total) by mouth daily. 30 tablet 6  . amLODipine (NORVASC) 5 MG tablet Take 1 tablet (5 mg total) by mouth daily. For blood pressure 30 tablet 6  . clotrimazole-betamethasone (LOTRISONE) cream Apply 1 application topically 2 (two) times daily. 30 g 6  . FLUoxetine (PROZAC) 20 MG capsule Take 1 capsule (20 mg total) by mouth daily. For your nerves 30 capsule 6  . furosemide (LASIX) 40 MG tablet Take 1 tablet (40 mg total) by mouth 2 (two) times daily. For fluid 60 tablet 6  . metFORMIN (GLUCOPHAGE) 1000 MG tablet Take 1 tablet (1,000 mg total) by mouth 2 (two) times daily with a meal. For diabetes 60 tablet 6    Assessment:    Acute Health Condition (wrist pain)  - Mr. Boruff says his wrist pain reported last month is improved although he is out of his prescription narcotic analgesic; he is to see Dr. Buelah Manis later this month and will discuss further with her at that time; his pain is described as aching or tingling   Chronic Health Condition (Diabetes) - poorly managed by patient; despite intensive education from [place], CDE, his providers, and me, Mr. Bertz has difficulty with dietary management. Despite his best efforts, learning differences create a barrier and Mr. Correll is somewhat at the mercy of his step brother and roommate Rusty's food preferences as [name] does most of the shopping and food preparation. Mr. Cleckler medication adherence is poor although slightly improved; review of his blister packs show that he has taken about 1/3 of his prescribed medications on a given month (which is better than not taking any meds at all as he was doing when we first started working together). Mr. Treadway is faithfully taking Insulin Glargine (TOUJEO SOLOSTAR) 300 UNIT/ML 20u daily at bedtime (9:30pm) as prescribed.  I spent a good deal of time reviewing in detail medications, rationale for each,  prescribing instructions, blister pack instuctions, and importance of adherence.    7 day average: last month N= 0, this month N= 7 Avg 307 14 day average: last month N= 0, this month N= 11 Avg 270 30 day average: last month N= 7 avg 212  , this month N= 20 Avg 273   HgA1C 01/28/15 = 10.0  HgA1C 06/01/15 = 8.2  Plan:  Mr. Dubie will continue daily cbg, bp, pulse, O2 sat checks, and weights and will record.   Mr. Marcussen will continue to take medications, including insulin, as prescribed.   Mr. Mia will see Ms. Crumpton RD, CDE in December.   Mr. Rathgeber will engage in walking program, striving for 10 minute walks 4 times/week.  I will see Mr. Prowse at home again in December.    [facility] CM Care Plan Problem Two        Most Recent Value   Care Plan Problem Two  poor blood sugar control   Role Documenting the Problem Two  Care Management [place] for Problem Two  Active   Interventions for Problem Two Long Term Goal   utilizing teachback method, reviewed with patient and brother the importance of maintaining adherence to diabetes plan of care   THN Long Term Goal (31-90) days  patient will note improvement in cbg's and HgA1C over the next 90 days as evidenced by lab data/documentation   THN Long Term Goal Start Date  08/25/15 [not met previous month,  re-established]   THN Long Term Goal Met Date  -- [did not meet previous month]   THN CM Short Term Goal #1 (0-30 days)  Over the next 7 days patient will recieve annual flu immunization   [facility] CM Short Term Goal #1 Start Date  07/22/15   [facility] CM Short Term Goal #1 Met Date   07/23/15   Interventions for Short Term Goal #2   utilizing teachback method, reviewed rationale for flu shot especially in diabetic patients,  planned visit for immunization administration   THN CM Short Term Goal #2 (0-30 days)  over the next 30 days patient will check cbg's daily and record, calling provider for cbg <70 or > 250   THN CM Short  Term Goal #2 Start Date  08/25/15 [not met last month,  re-established]   Interventions for Short Term Goal #2  utilizing teachback method, reviewed with patient rationale for and importance of checking cbg's daily   THN CM Short Term Goal #3 (0-30 days)  Over the next 30 days, patient will walk for exercise at least 3 days/week x 65mn   THN CM Short Term Goal #3 Start Date  08/25/15 [Barrie Folknot met previous month,  re-established]    [facility]        Most Recent Value   Care Plan Problem Three  Medication Non Adherence   Role Documenting the Problem Three  [place]  Active   THN Long Term Goal (31-90) days  over the next 31 days patient will demonstrate improvement in medication adherence as evidenced by pill counts and insulin syringe review   Interventions for Problem Three Long Term Goal  utilizing teachback method and demonstration, reviewed new medication and prescribing information with patient   THN CM Short Term Goal #1 (0-30 days)  patient will verbalize understanding of importance of adhering daily to his prescribed oral medication regimen over the next 30 days   THN CM Short Term Goal #1 Start Date  08/25/15   Interventions for Short Term Goal #1  Utilizing teachback method, reviewed with patient rationale for use of oral agent and insulin in treatment regimen   THN CM Short Term Goal #2 (0-30 days)  patient will take oral medications as prescribed at least 50% of the time over the next 30 days as evidenced by blister pack pill count   THN CM Short Term Goal #2 Start Date  08/25/15   Interventions for Short Term Goal #2  utilizing teachback method, reviewed medication blister pack use and vital importance of taking medications as prescribed       [facility]  [phone]

## 2015-08-31 ENCOUNTER — Encounter: Payer: Self-pay | Admitting: Licensed Clinical Social Worker

## 2015-08-31 ENCOUNTER — Other Ambulatory Visit: Payer: Self-pay | Admitting: Licensed Clinical Social Worker

## 2015-08-31 NOTE — Patient Outreach (Signed)
Assessment:  CSW called home phone number of client on 08/31/15 and spoke via phone with client on 08/31/15. CSW verified identity of client. CSW and client spoke of client needs. Client is attending scheduled medical appointments. Client has no transportation needs at present. Client has his prescribed medications and is trying to take medications as prescribed. Client receives scheduled in home care weekly with home health aide. Client continues to receive [facility] nursing support with RN Janalyn Shy.  CSW informed client on 08/31/15 that client had met his care plan goals with CSW services. Client and CSW spoke of client's progress in reaching his goals with CSW services.  CSW informed client on 08/31/15 that since client had met care plan goals for client with CSW services, that [place] would discharge client from [place] services only on 08/31/15.  Client agreed to this plan. Client understands that he will continue to receive [facility] nursing support with RN Janalyn Shy.  Client informed CSW that client was low on his supply of insulin.  CSW informed client that [place] would communicate this information to RN Janalyn Shy on 08/31/15. CSW thanked client for working with [place] in recent months and CSW congratulated client on his reaching care plan goals set with CSW services.  Plan: CSW to communicate information above with RN Janalyn Shy regarding client and client needs. CSW to communicate with Lurline Del on 08/31/15 to inform Lattie Haw that [place] discharged client from [place] services only on 08/31/15. CSW to fax letter  To Dr. Buelah Manis to inform Dr. Buelah Manis that [place] discharged client from [place] services only on 08/31/15.   Norva Riffle.Zailyn Thoennes MSW, LCSW Licensed Clinical Social Worker [facility] Care Management [phone]

## 2015-09-02 ENCOUNTER — Ambulatory Visit: Payer: Medicare Other | Admitting: Family Medicine

## 2015-09-07 ENCOUNTER — Ambulatory Visit (INDEPENDENT_AMBULATORY_CARE_PROVIDER_SITE_OTHER): Payer: Medicare Other | Admitting: Family Medicine

## 2015-09-07 ENCOUNTER — Encounter: Payer: Self-pay | Admitting: Family Medicine

## 2015-09-07 VITALS — BP 124/70 | HR 78 | Temp 98.8°F | Resp 18 | Ht 70.0 in | Wt 251.0 lb

## 2015-09-07 DIAGNOSIS — E0840 Diabetes mellitus due to underlying condition with diabetic neuropathy, unspecified: Secondary | ICD-10-CM

## 2015-09-07 DIAGNOSIS — E785 Hyperlipidemia, unspecified: Secondary | ICD-10-CM

## 2015-09-07 DIAGNOSIS — I10 Essential (primary) hypertension: Secondary | ICD-10-CM | POA: Diagnosis not present

## 2015-09-07 DIAGNOSIS — E0843 Diabetes mellitus due to underlying condition with diabetic autonomic (poly)neuropathy: Secondary | ICD-10-CM

## 2015-09-07 DIAGNOSIS — M5010 Cervical disc disorder with radiculopathy, unspecified cervical region: Secondary | ICD-10-CM

## 2015-09-07 DIAGNOSIS — M5030 Other cervical disc degeneration, unspecified cervical region: Secondary | ICD-10-CM

## 2015-09-07 DIAGNOSIS — Z794 Long term (current) use of insulin: Secondary | ICD-10-CM | POA: Diagnosis not present

## 2015-09-07 LAB — CBC WITH DIFFERENTIAL/PLATELET
Basophils Absolute: 0.1 10*3/uL (ref 0.0–0.1)
Basophils Relative: 1 % (ref 0–1)
Eosinophils Absolute: 0.4 10*3/uL (ref 0.0–0.7)
Eosinophils Relative: 4 % (ref 0–5)
HCT: 44.8 % (ref 39.0–52.0)
Hemoglobin: 15.7 g/dL (ref 13.0–17.0)
Lymphocytes Relative: 26 % (ref 12–46)
Lymphs Abs: 2.4 10*3/uL (ref 0.7–4.0)
MCH: 33.1 pg (ref 26.0–34.0)
MCHC: 35 g/dL (ref 30.0–36.0)
MCV: 94.3 fL (ref 78.0–100.0)
MPV: 10.6 fL (ref 8.6–12.4)
Monocytes Absolute: 0.7 10*3/uL (ref 0.1–1.0)
Monocytes Relative: 7 % (ref 3–12)
Neutro Abs: 5.8 10*3/uL (ref 1.7–7.7)
Neutrophils Relative %: 62 % (ref 43–77)
Platelets: 208 10*3/uL (ref 150–400)
RBC: 4.75 MIL/uL (ref 4.22–5.81)
RDW: 12.3 % (ref 11.5–15.5)
WBC: 9.3 10*3/uL (ref 4.0–10.5)

## 2015-09-07 LAB — COMPREHENSIVE METABOLIC PANEL
ALT: 35 U/L (ref 9–46)
AST: 35 U/L (ref 10–35)
Albumin: 4.2 g/dL (ref 3.6–5.1)
Alkaline Phosphatase: 77 U/L (ref 40–115)
BUN: 8 mg/dL (ref 7–25)
CO2: 25 mmol/L (ref 20–31)
Calcium: 9.3 mg/dL (ref 8.6–10.3)
Chloride: 97 mmol/L — ABNORMAL LOW (ref 98–110)
Creat: 0.72 mg/dL (ref 0.70–1.33)
Glucose, Bld: 289 mg/dL — ABNORMAL HIGH (ref 70–99)
Potassium: 4.2 mmol/L (ref 3.5–5.3)
Sodium: 135 mmol/L (ref 135–146)
Total Bilirubin: 0.5 mg/dL (ref 0.2–1.2)
Total Protein: 7.4 g/dL (ref 6.1–8.1)

## 2015-09-07 LAB — LIPID PANEL
Cholesterol: 171 mg/dL (ref 125–200)
HDL: 33 mg/dL — ABNORMAL LOW (ref >=40)
LDL Cholesterol: 111 mg/dL (ref <130)
Total CHOL/HDL Ratio: 5.2 Ratio — ABNORMAL HIGH (ref <=5.0)
Triglycerides: 136 mg/dL (ref <150)
VLDL: 27 mg/dL (ref <30)

## 2015-09-07 LAB — HEMOGLOBIN A1C
Hgb A1c MFr Bld: 9.7 % — ABNORMAL HIGH (ref <5.7)
Mean Plasma Glucose: 232 mg/dL — ABNORMAL HIGH (ref <117)

## 2015-09-07 MED ORDER — HYDROCODONE-ACETAMINOPHEN 5-325 MG PO TABS: 1.0000 | ORAL_TABLET | Freq: Two times a day (BID) | ORAL | Status: DC | PRN

## 2015-09-07 NOTE — Progress Notes (Signed)
Patient ID: Kevin [name], male   DOB: 08/13/1956, 59 y.o.   MRN: [id number]   Subjective:    Patient ID: Kevin [name], male    DOB: 01-03-1956, 59 y.o.   MRN: [id number]  Patient presents for 3 month F/U  patient had a follow-up chronic medical problems. He [facility]. He is also being followed by nutrition for his diabetes mellitus. He has difficulty with medication compliance but this has improved with the help of [facility] healthcare network in the community resources.  Diabetes mellitus his last A1c was 8.2%. He is currently on Tujeo 20 units as well as Januvia and metformin. His blood sugars range   Flu shot done and Hep C screening  Uses pain meds as needed for OA as well as his neck pain. He states the pain in his neck that shoots down the left side has been getting worse. He gets numbness and tingling in his fingertips. If he tries to raise his arm up he feels a pinching sensation in his neck. He has seen orthopedics in the past for this and they did not recommend surgery at that time because his blood sugars were on controlled.   Review Of Systems:  GEN- denies fatigue, fever, weight loss,weakness, recent illness HEENT- denies eye drainage, change in vision, nasal discharge, CVS- denies chest pain, palpitations RESP- denies SOB, cough, wheeze ABD- denies N/V, change in stools, abd pain GU- denies dysuria, hematuria, dribbling, incontinence MSK- + joint pain, muscle aches, injury Neuro- denies headache, dizziness, syncope, seizure activity       Objective:    BP 124/70 mmHg  Pulse 78  Temp(Src) 98.8 F (37.1 C) (Oral)  Resp 18  Ht 5\' 10"  (1.778 m)  Wt 251 lb (113.853 kg)  BMI 36.01 kg/m2 GEN- NAD, alert and oriented x3 HEENT- PERRL, EOMI, non injected sclera, pink conjunctiva, MMM, oropharynx clear Neck- Supple, no thryomegaly, Decreased ROM CVS- RRR, 2/6 SEM RESP-CTAB MSK- TTP C spine, +neers, +impigment signs, rotator  cuff intact, strength decreased LUE compared to right Neuro- normal tone UE, sensation grossly in tact EXT- no edema Pulses- Radial 2+, DP-1+         Assessment & Plan:      Problem List Items Addressed This Visit    Neuropathy, diabetic ([place])   Hyperlipidemia   Relevant Orders   Lipid panel   Essential hypertension, benign    Controlled, no change to meds      Diabetes mellitus with neurological manifestation (HCC) - Primary    Check A1C , goal < 7%, continue with nutritionist I have given him Lantus to use- 20 units at 9pm until his Tujeo comes in      Relevant Orders   CBC with Differential/Platelet   Comprehensive metabolic panel   Hemoglobin A1c   DDD (degenerative disc disease), cervical    He has been evaluated by orthopedics in the past. He is also seen neurology. There is concern that he has a pinched nerve as well as degenerative changes of the C-spine however symptoms seem to be worsening. He uses hydrocodone as needed for this as well as osteophytes of his knee. He does have some decreased range of motion and pain with impingement signs today. I'm been obtain an MRI of his cervical spine for further evaluation and to help with treatment.      Relevant Medications   HYDROcodone-acetaminophen (NORCO/VICODIN) 5-325 MG tablet      Note: This dictation was  prepared with Dragon dictation along with smaller phrase technology. Any transcriptional errors that result from this process are unintentional.

## 2015-09-07 NOTE — Assessment & Plan Note (Signed)
Controlled, no change to meds 

## 2015-09-07 NOTE — Assessment & Plan Note (Signed)
He has been evaluated by orthopedics in the past. He is also seen neurology. There is concern that he has a pinched nerve as well as degenerative changes of the C-spine however symptoms seem to be worsening. He uses hydrocodone as needed for this as well as osteophytes of his knee. He does have some decreased range of motion and pain with impingement signs today. I'm been obtain an MRI of his cervical spine for further evaluation and to help with treatment.

## 2015-09-07 NOTE — Patient Instructions (Addendum)
MRI of neck to be done Call [facility] and reschedule your nutrition appointment We will call with lab results Pain medicine refilled  F/U 4 months

## 2015-09-07 NOTE — Assessment & Plan Note (Signed)
Check A1C , goal < 7%, continue with nutritionist I have given him Lantus to use- 20 units at 9pm until his June [place]ap comes in

## 2015-09-08 ENCOUNTER — Other Ambulatory Visit: Payer: Self-pay | Admitting: *Deleted

## 2015-09-08 NOTE — Patient Outreach (Signed)
[name]) Care Management  09/08/2015  [name] Feb 21, 1956 [id number]  I reached out to [name] by phone today to let him know about an MRI that has been scheduled for 09/24/15 @ 11am at [facility] Radiology Department. Dr. Dorian Heckle office was unable to reach him on multiple separate attempts today. [name] acknowledge the appointment and said he would call the transportation service in the morning to scheduled his transport.   I will see [name] on 09/20/15 for a routine visit and will remind him of this appointment at that time.    [place] [facility]  [phone]

## 2015-09-10 ENCOUNTER — Ambulatory Visit: Payer: Medicare Other | Admitting: Nutrition

## 2015-09-11 ENCOUNTER — Telehealth: Payer: Self-pay | Admitting: Family Medicine

## 2015-09-11 NOTE — Telephone Encounter (Addendum)
Call placed to patient.   Patient made aware or results and provider recommendations that patient needs to take medications QD.   Patient states that he is taking prescriptions. Advised that pharmacy reports that medications have not been filled since May. Patient reports that he does not know how to refill meds. Advised that pharmacy reports that he is filling insulin. Patient reports that he is not able to afford medications.   Patient became belligerent and began yelling that he will not increase insulin because Dr Buelah Manis wants him on 20u. Advised PCP recommended increase in insulin. States that he will not be increasing insulin. Patient "brother" yelling and cursing at Probation officer. Phone call disconnected.

## 2015-09-11 NOTE — Telephone Encounter (Signed)
-----   Message from Troy, LPN sent at [phone] 12:48 PM EST -----   ----- Message -----    From: Clerance Lav, RN    Sent: 09/10/2015  12:30 PM      To: Eden Lathe Six, LPN  Good question. Mr. Belt has blister packs from the first of the year through June (last packs I've found). He takes his pills on average one day/week. At one point, I was able to get him to take them 3 days/week. He CNA Pensions consultant) can't administer them. They have a new CNA every other week because Rusty "fires" them when they won't do the housework for him. Whenever I meet a new one I also ask her to remind Xsavier to take his meds every day and of course he tells me he does but when I go looking for the blister packs and show him, he says he'll start doing better. Apart from him being placed somewhere that they'll be adminstered to him, I don't know what else we can do... ----- Message -----    From: Eden Lathe Six, LPN    Sent: [phone]  11:18 AM      To: Clerance Lav, RN  Nyra Capes!  I just was talking to [facility] about Mr. Wanita Chamberlain medication.   Ernestine Mcmurray states that Mr. Viens has not filled meds since 02/27/2015.  What can we do to help compliance?

## 2015-09-11 NOTE — Telephone Encounter (Signed)
Do not make any changes to cholesterol medication. He does need to go up on the insulin Call pt directly tell him he has to take his medications or he will lose his services for not being compliant.   This is all we can do.

## 2015-09-17 DIAGNOSIS — L84 Corns and callosities: Secondary | ICD-10-CM | POA: Diagnosis not present

## 2015-09-17 DIAGNOSIS — B351 Tinea unguium: Secondary | ICD-10-CM | POA: Diagnosis not present

## 2015-09-17 DIAGNOSIS — E1151 Type 2 diabetes mellitus with diabetic peripheral angiopathy without gangrene: Secondary | ICD-10-CM | POA: Diagnosis not present

## 2015-09-21 ENCOUNTER — Other Ambulatory Visit: Payer: Self-pay | Admitting: *Deleted

## 2015-09-21 MED ORDER — CLOTRIMAZOLE-BETAMETHASONE 1-0.05 % EX CREA: 1.0000 "application " | TOPICAL_CREAM | Freq: Two times a day (BID) | CUTANEOUS | Status: DC

## 2015-09-21 MED ORDER — GLUCOSE BLOOD VI STRP
ORAL_STRIP | Status: DC
Start: 2015-09-21 — End: 2016-01-05

## 2015-09-21 MED ORDER — CETIRIZINE HCL 10 MG PO TABS: 10.0000 mg | ORAL_TABLET | Freq: Every day | ORAL | Status: DC

## 2015-09-21 MED ORDER — AMLODIPINE BESYLATE 5 MG PO TABS: 5.0000 mg | ORAL_TABLET | Freq: Every day | ORAL | Status: DC

## 2015-09-21 MED ORDER — FUROSEMIDE 40 MG PO TABS: 40.0000 mg | ORAL_TABLET | Freq: Two times a day (BID) | ORAL | Status: DC

## 2015-09-21 MED ORDER — ATORVASTATIN CALCIUM 20 MG PO TABS: 20.0000 mg | ORAL_TABLET | Freq: Every day | ORAL | Status: DC

## 2015-09-21 MED ORDER — METFORMIN HCL 1000 MG PO TABS: 1000.0000 mg | ORAL_TABLET | Freq: Two times a day (BID) | ORAL | Status: DC

## 2015-09-21 MED ORDER — DICLOFENAC SODIUM 75 MG PO TBEC
DELAYED_RELEASE_TABLET | ORAL | Status: DC
Start: 2015-09-21 — End: 2019-03-29

## 2015-09-21 MED ORDER — POTASSIUM CHLORIDE ER 10 MEQ PO TBCR: 10.0000 meq | EXTENDED_RELEASE_TABLET | Freq: Two times a day (BID) | ORAL | Status: DC

## 2015-09-21 MED ORDER — SITAGLIPTIN PHOSPHATE 100 MG PO TABS: 100.0000 mg | ORAL_TABLET | Freq: Every day | ORAL | Status: DC

## 2015-09-21 MED ORDER — INSULIN GLARGINE 300 UNIT/ML ~~LOC~~ SOPN: 20.0000 [IU] | PEN_INJECTOR | Freq: Every day | SUBCUTANEOUS | Status: DC

## 2015-09-21 MED ORDER — FLUOXETINE HCL 20 MG PO CAPS: 20.0000 mg | ORAL_CAPSULE | Freq: Every day | ORAL | Status: DC

## 2015-09-21 MED ORDER — FOLIC ACID 1 MG PO TABS: 1.0000 mg | ORAL_TABLET | Freq: Every day | ORAL | Status: DC

## 2015-09-21 MED ORDER — DICLOFENAC SODIUM 1 % TD GEL: TRANSDERMAL | Status: DC

## 2015-09-21 NOTE — Patient Outreach (Signed)
[facility]) Care Management   09/21/2015  Kevin [name] Dec 08, 1955 [id number]  Kevin [name] is an 59 y.o. male who has been followed by [facility] long term (> 2 years) for chronic disease management (HTN, DM, CAD, Hyperlipidemia) and community resources (LCSW: financial management assistance and transportation assistance and management). He is very intelligent but does have some literacy barriers. He lives at home with his step brother. He has extended family who visit infrequently. He is now seeing primary care, endocrinology, poidatry, and vision routinely. Prior to our involvement, Kevin [name] was not always seeing his providers.   The focus of our work has been on DM management. Kevin [name] has been seeing the dietician and has shown some improvement in the area of dietary management. Kevin [name] is nonadherent to prescribed medications and hasn't had refills from [facility] since June 2016. He takes oral medications infrequently but takes his insulin strictly every evening at 9:30pm.   Subjective:  "[facility] doesn't do a good job. I want to go back to [place]. Then I can get it right."  Objective:  BP 130/72 mmHg  Pulse 71  SpO2 97%  Review of Systems  Constitutional: Negative.   HENT: Negative.   Eyes: Negative.   Respiratory: Negative.   Cardiovascular: Negative.   Gastrointestinal: Negative.   Genitourinary: Negative.   Musculoskeletal: Positive for myalgias and joint pain. Negative for falls.  Skin: Negative.   Neurological: Negative.   Psychiatric/Behavioral: Negative.     Physical Exam  Constitutional: He is oriented to person, place, and time. Vital signs are normal. He appears well-developed and well-nourished. He is active.  Cardiovascular: Normal rate and regular rhythm.   Respiratory: Effort normal and breath sounds normal.  GI: Soft. Bowel sounds are normal.  Neurological: He is alert and oriented to person, place,  and time.  Skin: Skin is warm and dry.  Psychiatric: He has a normal mood and affect. His speech is normal and behavior is normal. Judgment and thought content normal. Cognition and memory are normal.    Current Medications:   Current Outpatient Prescriptions  Medication Sig Dispense Refill  . ACCU-CHEK AVIVA PLUS test strip USE TO CHECK BLOOD SUGAR ONCE DAILY. 50 each 11  . blood glucose meter kit and supplies KIT Dispense based on patient and insurance preference. Use to monitor FSBS 1 daily. Dx: E11.8 1 each 0  . diclofenac (VOLTAREN) 75 MG EC tablet TAKE (1) TABLET BY MOUTH TWICE DAILY FOR INFLAMMATION. 60 tablet 6  . HYDROcodone-acetaminophen (NORCO/VICODIN) 5-325 MG tablet Take 1 tablet by mouth 2 (two) times daily as needed. 60 tablet 0  . Insulin Glargine (TOUJEO SOLOSTAR) 300 UNIT/ML SOPN Inject 20 Units into the skin at bedtime. 3 pen 11  . Insulin Pen Needle 29G X 12.7MM MISC Use as directed 100 each 11  . amLODipine (NORVASC) 5 MG tablet Take 1 tablet (5 mg total) by mouth daily. For blood pressure 30 tablet 6  . atorvastatin (LIPITOR) 20 MG tablet Take 1 tablet (20 mg total) by mouth daily. (Patient not taking: Reported on 09/21/2015) 30 tablet 6  . cetirizine (ZYRTEC) 10 MG tablet Take 1 tablet (10 mg total) by mouth daily. For allergies (Patient not taking: Reported on 09/21/2015) 30 tablet 6  . clotrimazole-betamethasone (LOTRISONE) cream Apply 1 application topically 2 (two) times daily. (Patient not taking: Reported on 09/21/2015) 30 g 6  . diclofenac sodium (VOLTAREN) 1 % GEL Apply to knee three times a  day as needed (Patient not taking: Reported on 09/21/2015) 1 Tube 6  . FLUoxetine (PROZAC) 20 MG capsule Take 1 capsule (20 mg total) by mouth daily. For your nerves 30 capsule 6  . folic acid (FOLVITE) 1 MG tablet Take 1 tablet (1 mg total) by mouth daily. (Patient not taking: Reported on 09/21/2015) 30 tablet 6  . furosemide (LASIX) 40 MG tablet Take 1 tablet (40 mg total) by  mouth 2 (two) times daily. For fluid 60 tablet 6  . metFORMIN (GLUCOPHAGE) 1000 MG tablet Take 1 tablet (1,000 mg total) by mouth 2 (two) times daily with a meal. For diabetes 60 tablet 6  . nitroGLYCERIN (NITROSTAT) 0.4 MG SL tablet Place 1 tablet under the tongue every 5 minutes as needed for chest pain x3. If not resolved after 3, go to ER. (Patient not taking: Reported on 09/21/2015) 30 tablet 6  . potassium chloride (K-DUR) 10 MEQ tablet Take 1 tablet (10 mEq total) by mouth 2 (two) times daily. Take with potassium (Patient not taking: Reported on 09/21/2015) 60 tablet 6  . sitaGLIPtin (JANUVIA) 100 MG tablet Take 1 tablet (100 mg total) by mouth daily. (Patient not taking: Reported on 09/21/2015) 30 tablet 6   Assessment:  59 year old male patient with diabetes, COPD, HTN, and chronic joint and musculoskeletal pain. We have worked with Kevin [name] for > 2 years to help make improvements in self health management. Social work has signed off having helped Kevin [name] develop the skills needed to manage his appointments and transportation. I worked with Kevin [name] on medication adherence for months using pill boxes and eventually got him transitioned to pre-filled blister packs. He was very hesitant to start insulin but takes it strictly every evening at 9:30pm. Once he began taking insulin, he nearly completely stopped taking oral medications other than pain medications. He will using topical analgesics. Kevin [name] says today the reason he doesn't take his medications is because [facility] won't fill them when he calls for refills. I've discussed Kevin [name] medications with the pharmacist at [facility] and he confirms that Kevin [name] has not requested or had refills since June/July of this year. Kevin [name] says he will take his medications again when they're moved back to [facility].   Kevin [name] frequently tells me he thinks something is wrong with his glucometer because "it says its too  high". He has gaps in cbg monitoring but will have typically checked it 3 or 4 times in the morning before I visit. Today he said "it was 400 then 300 but now its 202; something's wrong with it." I've compared cbg findings from my monitor and his monitor and they vary only by 2-3 points.   Mr. Volante sees [name] RD, CDE and has made a few changes to his diet but finds it difficult to adhere to the prescribed guidelines in my opinion because his food selections at home are controlled by his step brother Kevin [name] and in part because he believes he understands his prescribed diet but does not, as evidenced by his verbalization of incorrect dietary guidelines.   Mr. Travoris [name] has improved over time in response to his willingness to take insulin but is on the rise again. I believe this is because he stopped taking oral medications altogether after a brief period of taking his Metformin and other oral medications albeit intermittently. 01/28/15: 10 06/01/15: 8.2 09/07/15: 9.7  Kevin [name] has gone through at least a dozen CNA's from  various agencies over the last 18 months. Based on conversations with Kevin [name] and his step brother Kevin [name], the reason CNA's leave employ with Kevin [name] is "they don't want to do any work around the house like we need them to." He tells me today that [facility] is having a "hearing" about whether they will continue his services and he doesn't know if he'll have any CNA services at all after 09/28/15.   As outlined below, Mr. Vences has not met case management/care plan goals.   Plan:   I explained to Mr. Ekstein today that I would let Dr. Buelah Manis know that he wants to change his prescriptions again back to [facility].   I explained to Mr. Meneely that I would follow up with him by phone after Christmas but felt that [facility] has provided all the assistance we are able to without him being willing to take responsibility for taking his  medications. He is able to manage his provider appointment schedule and transportation arrangements. He has a cell phone and is able to use public transportation to pay his bills each month.   Will plan discharge from [facility] services in January.   My recommendation is a higher level of care for Mr. Laure Kidney where he can have supervision and assistance with ongoing health management needs.   [facility] CM Care Plan Problem Two        Most Recent Value   Care Plan Problem Two  poor blood sugar control   Role Documenting the Problem Two  Care Management [place] for Problem Two  Active   Interventions for Problem Two Long Term Goal   utilizing teachback method, reviewed with patient and brother the importance of maintaining adherence to diabetes plan of care   [facility] Long Term Goal (31-90) days  patient will note improvement in cbg's and HgA1C over the next 90 days as evidenced by lab data/documentation   [facility] Long Term Goal Start Date  08/25/15 [not met previous month,  re-established]   [facility] Long Term Goal Met Date  -- [did not meet previous month]   Interventions for Short Term Goal #2   utilizing teachback method, reviewed rationale for flu shot especially in diabetic patients,  planned visit for immunization administration   [facility] CM Short Term Goal #2 (0-30 days)  over the next 30 days patient will check cbg's daily and record, calling provider for cbg <70 or > 250 [NOT MET]   [facility] CM Short Term Goal #2 Start Date  08/25/15 [not met last month,  re-established]   Interventions for Short Term Goal #2  utilizing teachback method, reviewed with patient rationale for and importance of checking cbg's daily   [facility] CM Short Term Goal #3 (0-30 days)  Over the next 30 days, patient will walk for exercise at least 3 days/week x 36mn [NOT MET]   [facility] CM Short Term Goal #3 Start Date  08/25/15 [Barrie Folknot met previous month,  re-established]    [facility] Care Plan Problem Three        Most Recent  Value   Care Plan Problem Three  Medication Non Adherence   Role Documenting the Problem Three  Care Management Coordinator   Care Plan for Problem Three  Active   [facility] Long Term Goal (31-90) days  over the next 31 days patient will demonstrate improvement in medication adherence as evidenced by pill counts and insulin syringe review [NOT MET]  Interventions for Problem Three Long Term Goal  utilizing teachback method and demonstration, reviewed new medication and prescribing information with patient   [facility] CM Short Term Goal #1 (0-30 days)  patient will verbalize understanding of importance of adhering daily to his prescribed oral medication regimen over the next 30 days [NOT MET]   [facility] CM Short Term Goal #1 Start Date  08/25/15   Interventions for Short Term Goal #1  Utilizing teachback method, reviewed with patient rationale for use of oral agent and insulin in treatment regimen   [facility] CM Short Term Goal #2 (0-30 days)  patient will take oral medications as prescribed at least 50% of the time over the next 30 days as evidenced by blister pack pill count [NOT MET]   [facility] CM Short Term Goal #2 Start Date  08/25/15   Interventions for Short Term Goal #2  utilizing teachback method, reviewed medication blister pack use and vital importance of taking medications as prescribed      [facility]  [phone]

## 2015-09-21 NOTE — Telephone Encounter (Signed)
Per chart notes of [facility] RN, medication refills sent to [facility].

## 2015-09-22 ENCOUNTER — Telehealth: Payer: Self-pay | Admitting: *Deleted

## 2015-09-22 NOTE — Telephone Encounter (Signed)
Received request from pharmacy for PA on Voltaren Gel.   Patient has elevated LFT's, therefoer, we must avoid use of oral NSAID's.   PA submitted.   Dx: M17.9- OA of Knee

## 2015-09-23 ENCOUNTER — Ambulatory Visit: Payer: Medicare Other | Admitting: Nutrition

## 2015-09-23 NOTE — Telephone Encounter (Signed)
Received PA determination.   PA approved through 09/21/2016.  Ref # [id number].  Pharmacy made aware.

## 2015-09-24 ENCOUNTER — Ambulatory Visit (HOSPITAL_COMMUNITY): Admission: RE | Admit: 2015-09-24 | Payer: Medicare Other | Source: Ambulatory Visit

## 2015-10-07 ENCOUNTER — Ambulatory Visit (HOSPITAL_COMMUNITY): Payer: Medicare Other

## 2015-10-20 ENCOUNTER — Other Ambulatory Visit (HOSPITAL_COMMUNITY): Payer: Medicare Other

## 2015-10-21 ENCOUNTER — Ambulatory Visit (HOSPITAL_COMMUNITY)
Admission: RE | Admit: 2015-10-21 | Discharge: 2015-10-21 | Disposition: A | Discharge status: Home / Self Care | Payer: Medicare Other | Source: Ambulatory Visit | Attending: Family Medicine | Admitting: Family Medicine

## 2015-10-21 ENCOUNTER — Other Ambulatory Visit: Payer: Self-pay | Admitting: Family Medicine

## 2015-10-21 DIAGNOSIS — M542 Cervicalgia: Secondary | ICD-10-CM | POA: Insufficient documentation

## 2015-10-21 DIAGNOSIS — M50323 Other cervical disc degeneration at C6-C7 level: Secondary | ICD-10-CM | POA: Diagnosis not present

## 2015-10-21 DIAGNOSIS — M4802 Spinal stenosis, cervical region: Secondary | ICD-10-CM | POA: Diagnosis not present

## 2015-10-21 DIAGNOSIS — M5030 Other cervical disc degeneration, unspecified cervical region: Secondary | ICD-10-CM

## 2015-10-21 DIAGNOSIS — M5010 Cervical disc disorder with radiculopathy, unspecified cervical region: Secondary | ICD-10-CM

## 2015-10-26 ENCOUNTER — Other Ambulatory Visit: Payer: Self-pay | Admitting: *Deleted

## 2015-10-26 ENCOUNTER — Ambulatory Visit: Payer: Medicare Other | Admitting: Nutrition

## 2015-10-26 DIAGNOSIS — M5030 Other cervical disc degeneration, unspecified cervical region: Secondary | ICD-10-CM

## 2015-10-26 DIAGNOSIS — M4802 Spinal stenosis, cervical region: Secondary | ICD-10-CM

## 2015-11-20 ENCOUNTER — Encounter: Payer: Self-pay | Admitting: *Deleted

## 2015-11-20 ENCOUNTER — Other Ambulatory Visit: Payer: Self-pay | Admitting: *Deleted

## 2015-11-20 NOTE — Patient Outreach (Signed)
[facility]) Care Management  11/20/2015  Kevin [name] 12-Feb-1956 [id number]   Final call to Kevin [name] today to discuss his case closure. He states he feels he is adhering to his prescribed medication regimen and treatment plan despite the fact that he rarely takes his medication, continues to smoke, and does not adhere to his prescribed carb modified diet. Kevin [name] is seeing the dietician regularly and takes his insulin but rarely takes oral medications.   Kevin [name] agreed to formal discharge from [facility] services.   I have notified Dr. Buelah Manis.    Janalyn Shy MHA,BSN,RN,CCM [facility] Care Management  [phone]

## 2015-11-26 DIAGNOSIS — L84 Corns and callosities: Secondary | ICD-10-CM | POA: Diagnosis not present

## 2015-11-26 DIAGNOSIS — B351 Tinea unguium: Secondary | ICD-10-CM | POA: Diagnosis not present

## 2015-11-26 DIAGNOSIS — E1151 Type 2 diabetes mellitus with diabetic peripheral angiopathy without gangrene: Secondary | ICD-10-CM | POA: Diagnosis not present

## 2016-01-05 ENCOUNTER — Encounter: Payer: Self-pay | Admitting: Family Medicine

## 2016-01-05 ENCOUNTER — Ambulatory Visit (INDEPENDENT_AMBULATORY_CARE_PROVIDER_SITE_OTHER): Payer: Medicare Other | Admitting: Family Medicine

## 2016-01-05 VITALS — BP 132/78 | HR 80 | Temp 98.1°F | Resp 16 | Ht 70.0 in | Wt 238.0 lb

## 2016-01-05 DIAGNOSIS — E0840 Diabetes mellitus due to underlying condition with diabetic neuropathy, unspecified: Secondary | ICD-10-CM

## 2016-01-05 DIAGNOSIS — E0843 Diabetes mellitus due to underlying condition with diabetic autonomic (poly)neuropathy: Secondary | ICD-10-CM | POA: Diagnosis not present

## 2016-01-05 DIAGNOSIS — I10 Essential (primary) hypertension: Secondary | ICD-10-CM

## 2016-01-05 DIAGNOSIS — Z550 Illiteracy and low-level literacy: Secondary | ICD-10-CM

## 2016-01-05 DIAGNOSIS — Z794 Long term (current) use of insulin: Secondary | ICD-10-CM

## 2016-01-05 DIAGNOSIS — E785 Hyperlipidemia, unspecified: Secondary | ICD-10-CM

## 2016-01-05 LAB — LIPID PANEL
Cholesterol: 190 mg/dL (ref 125–200)
HDL: 30 mg/dL — ABNORMAL LOW (ref >=40)
LDL Cholesterol: 103 mg/dL (ref <130)
Total CHOL/HDL Ratio: 6.3 Ratio — ABNORMAL HIGH (ref <=5.0)
Triglycerides: 287 mg/dL — ABNORMAL HIGH (ref <150)
VLDL: 57 mg/dL — ABNORMAL HIGH (ref <30)

## 2016-01-05 LAB — CBC WITH DIFFERENTIAL/PLATELET
Basophils Absolute: 0.1 10*3/uL (ref 0.0–0.1)
Basophils Relative: 1 % (ref 0–1)
Eosinophils Absolute: 0.4 10*3/uL (ref 0.0–0.7)
Eosinophils Relative: 4 % (ref 0–5)
HCT: 47.2 % (ref 39.0–52.0)
Hemoglobin: 16.3 g/dL (ref 13.0–17.0)
Lymphocytes Relative: 26 % (ref 12–46)
Lymphs Abs: 2.4 10*3/uL (ref 0.7–4.0)
MCH: 32.7 pg (ref 26.0–34.0)
MCHC: 34.5 g/dL (ref 30.0–36.0)
MCV: 94.6 fL (ref 78.0–100.0)
MPV: 10.2 fL (ref 8.6–12.4)
Monocytes Absolute: 0.7 10*3/uL (ref 0.1–1.0)
Monocytes Relative: 7 % (ref 3–12)
Neutro Abs: 5.8 10*3/uL (ref 1.7–7.7)
Neutrophils Relative %: 62 % (ref 43–77)
Platelets: 174 10*3/uL (ref 150–400)
RBC: 4.99 MIL/uL (ref 4.22–5.81)
RDW: 12 % (ref 11.5–15.5)
WBC: 9.3 10*3/uL (ref 4.0–10.5)

## 2016-01-05 LAB — COMPREHENSIVE METABOLIC PANEL
ALT: 30 U/L (ref 9–46)
AST: 25 U/L (ref 10–35)
Albumin: 3.6 g/dL (ref 3.6–5.1)
Alkaline Phosphatase: 83 U/L (ref 40–115)
BUN: 8 mg/dL (ref 7–25)
CO2: 23 mmol/L (ref 20–31)
Calcium: 8.8 mg/dL (ref 8.6–10.3)
Chloride: 99 mmol/L (ref 98–110)
Creat: 0.64 mg/dL — ABNORMAL LOW (ref 0.70–1.33)
Glucose, Bld: 285 mg/dL — ABNORMAL HIGH (ref 70–99)
Potassium: 4.1 mmol/L (ref 3.5–5.3)
Sodium: 134 mmol/L — ABNORMAL LOW (ref 135–146)
Total Bilirubin: 0.6 mg/dL (ref 0.2–1.2)
Total Protein: 7.1 g/dL (ref 6.1–8.1)

## 2016-01-05 MED ORDER — BLOOD GLUCOSE SYSTEM PAK KIT: PACK | Status: DC

## 2016-01-05 MED ORDER — LANCETS MISC: Status: DC

## 2016-01-05 MED ORDER — LANCET DEVICES MISC: Status: DC

## 2016-01-05 MED ORDER — BLOOD GLUCOSE TEST VI STRP: ORAL_STRIP | Status: DC

## 2016-01-05 MED ORDER — HYDROCODONE-ACETAMINOPHEN 5-325 MG PO TABS: 1.0000 | ORAL_TABLET | Freq: Two times a day (BID) | ORAL | Status: DC | PRN

## 2016-01-05 NOTE — Assessment & Plan Note (Signed)
Continue with podiatry in the topical neuropathic cream have also refilled his hydrocodone

## 2016-01-05 NOTE — Assessment & Plan Note (Signed)
Blood pressure is well-controlled and change medication

## 2016-01-05 NOTE — Patient Instructions (Addendum)
Continue current medications Fill your pain medication  F/U 4 months

## 2016-01-05 NOTE — Progress Notes (Signed)
Patient ID: Kevin [name], male   DOB: 11-Oct-1955, 60 y.o.   MRN: [id number]   Subjective:    Patient ID: Kevin [name], male    DOB: December 16, 1955, 59 y.o.   MRN: [id number]  Patient presents for 4 month F/U Patient to follow-up chronic medical problems. He has no specific concerns today. He is still being followed by podiatry. He does request a refill on his pain medications which he uses for his arthritis in his leg pains. He is now using a topical neuropathic cream for his diabetes neuropathy. He did not bring his meter with him today but states fissure has been good. He has been following with nutrition isn't intentionally trying to lose weight. His weight is down 13 pounds at her last visit in November. His last A1c was 9.7%.  He did not bring his medications with him today but states that he is taking as prescribed.    Review Of Systems:  GEN- denies fatigue, fever, weight loss,weakness, recent illness HEENT- denies eye drainage, change in vision, nasal discharge, CVS- denies chest pain, palpitations RESP- denies SOB, cough, wheeze ABD- denies N/V, change in stools, abd pain GU- denies dysuria, hematuria, dribbling, incontinence MSK- + joint pain, muscle aches, injury Neuro- denies headache, dizziness, syncope, seizure activity       Objective:    BP 132/78 mmHg  Pulse 80  Temp(Src) 98.1 F (36.7 C) (Oral)  Resp 16  Ht 5\' 10"  (1.778 m)  Wt 238 lb (107.956 kg)  BMI 34.15 kg/m2 GEN- NAD, alert and oriented x3 HEENT- PERRL, EOMI, non injected sclera, pink conjunctiva, MMM, oropharynx clear CVS- RRR, 2/6 SEM  RESP-CTAB EXT- No edema Pulses- Radial, DP- 2+        Assessment & Plan:      Problem List Items Addressed This Visit    Neuropathy, diabetic ([place])    Continue with podiatry in the topical neuropathic cream have also refilled his hydrocodone      Illiteracy and low-level literacy   Hyperlipidemia   Relevant Orders   Lipid panel   Essential  hypertension, benign - Primary    Blood pressure is well-controlled and change medication      Relevant Orders   CBC with Differential/Platelet   Diabetes mellitus with neurological manifestation (HCC)    Diabetes has been improving with his weight loss is suspected his A1c will be down. He is currently on Tujeo  25 units Along with his metformin and Januvia      Relevant Orders   Hemoglobin A1c   Comprehensive metabolic panel   Lipid panel      Note: This dictation was prepared with Dragon dictation along with smaller phrase technology. Any transcriptional errors that result from this process are unintentional.

## 2016-01-05 NOTE — Addendum Note (Signed)
Addended by: Sheral Flow on: 01/05/2016 05:05 PM   Modules accepted: Orders

## 2016-01-05 NOTE — Assessment & Plan Note (Signed)
Diabetes has been improving with his weight loss is suspected his A1c will be down. He is currently on Tujeo  25 units Along with his metformin and Januvia

## 2016-01-06 LAB — HEMOGLOBIN A1C
Hgb A1c MFr Bld: 13.1 % — ABNORMAL HIGH (ref <5.7)
Mean Plasma Glucose: 329 mg/dL

## 2016-02-04 DIAGNOSIS — L84 Corns and callosities: Secondary | ICD-10-CM | POA: Diagnosis not present

## 2016-02-04 DIAGNOSIS — E1151 Type 2 diabetes mellitus with diabetic peripheral angiopathy without gangrene: Secondary | ICD-10-CM | POA: Diagnosis not present

## 2016-02-04 DIAGNOSIS — B351 Tinea unguium: Secondary | ICD-10-CM | POA: Diagnosis not present

## 2016-04-14 ENCOUNTER — Telehealth: Payer: Self-pay | Admitting: Family Medicine

## 2016-04-14 MED ORDER — INSULIN GLARGINE 300 UNIT/ML ~~LOC~~ SOPN: 20.0000 [IU] | PEN_INJECTOR | Freq: Every day | SUBCUTANEOUS | Status: DC

## 2016-04-14 NOTE — Telephone Encounter (Signed)
Patient is calling to see if he can get refill on toujeo to be sent to [facility]  [phone] (H)

## 2016-04-14 NOTE — Telephone Encounter (Signed)
Pt left a message requesting that we send a prescription for Toujeo to [facility].

## 2016-04-14 NOTE — Telephone Encounter (Signed)
Please see prior message.   Prescription sent to pharmacy.

## 2016-04-14 NOTE — Telephone Encounter (Signed)
Prescription sent to pharmacy.

## 2016-04-28 DIAGNOSIS — B351 Tinea unguium: Secondary | ICD-10-CM | POA: Diagnosis not present

## 2016-04-28 DIAGNOSIS — E1151 Type 2 diabetes mellitus with diabetic peripheral angiopathy without gangrene: Secondary | ICD-10-CM | POA: Diagnosis not present

## 2016-04-28 DIAGNOSIS — L84 Corns and callosities: Secondary | ICD-10-CM | POA: Diagnosis not present

## 2016-05-09 ENCOUNTER — Ambulatory Visit (INDEPENDENT_AMBULATORY_CARE_PROVIDER_SITE_OTHER): Payer: Medicare Other | Admitting: Family Medicine

## 2016-05-09 ENCOUNTER — Encounter: Payer: Self-pay | Admitting: Family Medicine

## 2016-05-09 VITALS — BP 130/76 | HR 82 | Temp 98.0°F | Resp 16 | Ht 70.0 in | Wt 243.0 lb

## 2016-05-09 DIAGNOSIS — I10 Essential (primary) hypertension: Secondary | ICD-10-CM

## 2016-05-09 DIAGNOSIS — E0843 Diabetes mellitus due to underlying condition with diabetic autonomic (poly)neuropathy: Secondary | ICD-10-CM

## 2016-05-09 DIAGNOSIS — Z550 Illiteracy and low-level literacy: Secondary | ICD-10-CM

## 2016-05-09 DIAGNOSIS — E669 Obesity, unspecified: Secondary | ICD-10-CM

## 2016-05-09 DIAGNOSIS — Z794 Long term (current) use of insulin: Secondary | ICD-10-CM

## 2016-05-09 DIAGNOSIS — E0840 Diabetes mellitus due to underlying condition with diabetic neuropathy, unspecified: Secondary | ICD-10-CM

## 2016-05-09 LAB — HEMOGLOBIN A1C, FINGERSTICK: Hgb A1C (fingerstick): 13.3 % — ABNORMAL HIGH (ref <5.7)

## 2016-05-09 MED ORDER — BLOOD GLUCOSE TEST VI STRP: ORAL_STRIP | 3 refills | Status: AC

## 2016-05-09 MED ORDER — INSULIN GLARGINE 300 UNIT/ML ~~LOC~~ SOPN: 35.0000 [IU] | PEN_INJECTOR | Freq: Every day | SUBCUTANEOUS | 11 refills | Status: DC

## 2016-05-09 NOTE — Assessment & Plan Note (Addendum)
Diabetes with peripheral neuropathy. His blood sugar is uncontrolled I's and nurses to his home to assist with him he is still been noncompliant. I'm increasing his insulin to 35 units as he is adamant he is taking 28 units. I will have him come in 1 week with his meter and his medications. I will refer him to an ophthalmologist he is are ready seen podiatry Discussed no SODA, CANDY, Sugary snacks,fried foods

## 2016-05-09 NOTE — Assessment & Plan Note (Signed)
Blood pressure is controlled with check his liver panel renal function as we have difficulty getting them in for appointments.

## 2016-05-09 NOTE — Progress Notes (Signed)
Subjective:    Patient ID: Kevin [name], male    DOB: [birth year], 60 y.o.   MRN: [id number]  Patient presents for 4 month F/U (is not fasting) Patient to follow-up chronic medical problems. He states he is taking his medications and given his insulin injections. He states his blood sugars are in the 130s in the morning I do not have any records. Note we have problems with him as well as his family friend who helps take care of him with regards to compliance with medication and giving attitude towards staff. His last A1c was 13% his A1c done today in the office is still at 13.3% he states that he is injecting 28 units of his insulin at bedtime and taking his pills. He denies any chest pain or shortness of breath overall feels well. His weight is up 5 pounds. He states that he's been eating mostly Cheerios fruits and veggies and drinking only water. Follows with podiatry    Review Of Systems:  GEN- denies fatigue, fever, weight loss,weakness, recent illness HEENT- denies eye drainage, change in vision, nasal discharge, CVS- denies chest pain, palpitations RESP- denies SOB, cough, wheeze ABD- denies N/V, change in stools, abd pain GU- denies dysuria, hematuria, dribbling, incontinence MSK- denies joint pain, muscle aches, injury Neuro- denies headache, dizziness, syncope, seizure activity       Objective:    BP 130/76 (BP Location: Left Arm, Patient Position: Sitting, Cuff Size: Large)   Pulse 82   Temp 98 F (36.7 C) (Oral)   Resp 16   Ht 5\' 10"  (1.778 m)   Wt 243 lb (110.2 kg)   BMI 34.87 kg/m  GEN- NAD, alert and oriented x3 HEENT- PERRL, EOMI, non injected sclera, pink conjunctiva, MMM, oropharynx clear Neck- Supple, no thyromegaly, no bruit  CVS- RRR, 2/6 SEM  RESP-CTAB ABD-NABS,soft,NT,ND EXT- No edema Pulses- Radial 2+, DP 1+        Assessment & Plan:      Problem List Items Addressed This Visit    Obesity - Primary   Relevant Medications   Insulin  Glargine (TOUJEO SOLOSTAR) 300 UNIT/ML SOPN   Neuropathy, diabetic (HCC)   Relevant Medications   Insulin Glargine (TOUJEO SOLOSTAR) 300 UNIT/ML SOPN   Illiteracy and low-level literacy   Essential hypertension, benign    Blood pressure is controlled with check his liver panel renal function as we have difficulty getting them in for appointments.      Relevant Orders   Lipid panel   Diabetes mellitus with neurological manifestation ([place])    Diabetes with peripheral neuropathy. His blood sugar is uncontrolled I's and nurses to his home to assist with him he is still been noncompliant. I'm increasing his insulin to 35 units as he is adamant he is taking 28 units. I will have him come in 1 week with his meter and his medications. I will refer him to an ophthalmologist he is are ready seen podiatry Discussed no SODA, CANDY, Sugary snacks,fried foods      Relevant Medications   Insulin Glargine (TOUJEO SOLOSTAR) 300 UNIT/ML SOPN   Other Relevant Orders   CBC with Differential/Platelet   Comprehensive metabolic panel   Hemoglobin A1C, fingerstick (Completed)   Lipid panel   HM DIABETES FOOT EXAM (Completed)   Ambulatory referral to Ophthalmology    Other Visit Diagnoses   None.     Note: This dictation was prepared with Dragon dictation along with smaller phrase technology. Any transcriptional errors that result  from this process are unintentional.

## 2016-05-09 NOTE — Patient Instructions (Addendum)
Referral to eye doctor  We will call with lab results  Increase Tujeo 35units  No Soda, drink water  F/U 1 week

## 2016-05-10 LAB — CBC WITH DIFFERENTIAL/PLATELET
Basophils Absolute: 107 cells/uL (ref 0–200)
Basophils Relative: 1 %
Eosinophils Absolute: 214 cells/uL (ref 15–500)
Eosinophils Relative: 2 %
HCT: 47.2 % (ref 38.5–50.0)
Hemoglobin: 16.2 g/dL (ref 13.0–17.0)
Lymphocytes Relative: 24 %
Lymphs Abs: 2568 cells/uL (ref 850–3900)
MCH: 32.3 pg (ref 27.0–33.0)
MCHC: 34.3 g/dL (ref 32.0–36.0)
MCV: 94 fL (ref 80.0–100.0)
MPV: 10.5 fL (ref 7.5–12.5)
Monocytes Absolute: 642 cells/uL (ref 200–950)
Monocytes Relative: 6 %
Neutro Abs: 7169 cells/uL (ref 1500–7800)
Neutrophils Relative %: 67 %
Platelets: 207 10*3/uL (ref 140–400)
RBC: 5.02 MIL/uL (ref 4.20–5.80)
RDW: 12.5 % (ref 11.0–15.0)
WBC: 10.7 10*3/uL (ref 3.8–10.8)

## 2016-05-10 LAB — LIPID PANEL
Cholesterol: 188 mg/dL (ref 125–200)
HDL: 37 mg/dL — ABNORMAL LOW (ref >=40)
LDL Cholesterol: 106 mg/dL (ref <130)
Total CHOL/HDL Ratio: 5.1 Ratio — ABNORMAL HIGH (ref <=5.0)
Triglycerides: 225 mg/dL — ABNORMAL HIGH (ref <150)
VLDL: 45 mg/dL — ABNORMAL HIGH (ref <30)

## 2016-05-10 LAB — COMPREHENSIVE METABOLIC PANEL
ALT: 23 U/L (ref 9–46)
AST: 17 U/L (ref 10–35)
Albumin: 4.1 g/dL (ref 3.6–5.1)
Alkaline Phosphatase: 81 U/L (ref 40–115)
BUN: 11 mg/dL (ref 7–25)
CO2: 22 mmol/L (ref 20–31)
Calcium: 9.2 mg/dL (ref 8.6–10.3)
Chloride: 100 mmol/L (ref 98–110)
Creat: 0.74 mg/dL (ref 0.70–1.33)
Glucose, Bld: 236 mg/dL — ABNORMAL HIGH (ref 70–99)
Potassium: 4.1 mmol/L (ref 3.5–5.3)
Sodium: 134 mmol/L — ABNORMAL LOW (ref 135–146)
Total Bilirubin: 0.5 mg/dL (ref 0.2–1.2)
Total Protein: 7 g/dL (ref 6.1–8.1)

## 2016-05-11 ENCOUNTER — Ambulatory Visit: Payer: Medicare Other | Admitting: Family Medicine

## 2016-05-18 ENCOUNTER — Ambulatory Visit: Payer: Medicare Other | Admitting: Family Medicine

## 2016-05-20 ENCOUNTER — Encounter: Payer: Self-pay | Admitting: Family Medicine

## 2016-05-20 ENCOUNTER — Ambulatory Visit (INDEPENDENT_AMBULATORY_CARE_PROVIDER_SITE_OTHER): Payer: Medicare Other | Admitting: Family Medicine

## 2016-05-20 VITALS — BP 124/80 | HR 78 | Ht 70.0 in | Wt 238.0 lb

## 2016-05-20 DIAGNOSIS — E785 Hyperlipidemia, unspecified: Secondary | ICD-10-CM

## 2016-05-20 DIAGNOSIS — Z794 Long term (current) use of insulin: Secondary | ICD-10-CM | POA: Diagnosis not present

## 2016-05-20 DIAGNOSIS — E0840 Diabetes mellitus due to underlying condition with diabetic neuropathy, unspecified: Secondary | ICD-10-CM | POA: Diagnosis not present

## 2016-05-20 NOTE — Progress Notes (Signed)
   Subjective:    Patient ID: Kevin [name], male    DOB: [birth year], 60 y.o.   MRN: [id number]  Patient presents for Follow-up  Here for intermin f/u on diabetes , did not bring meds But has glucometer, which shows 7 day average of 205, this AM fasting 163, he is taking 35 units since last week, A1C was 13.3%   States taking all of his other medications regulary, his weight went down 5lbs No new concerns    Review Of Systems:  GEN- denies fatigue, fever, weight loss,weakness, recent illness HEENT- denies eye drainage, change in vision, nasal discharge, CVS- denies chest pain, palpitations RESP- denies SOB, cough, wheeze ABD- denies N/V, change in stools, abd pain GU- denies dysuria, hematuria, dribbling, incontinence MSK- denies joint pain, muscle aches, injury Neuro- denies headache, dizziness, syncope, seizure activity       Objective:    BP 124/80   Pulse 78   Ht 5\' 10"  (1.778 m)   Wt 238 lb (108 kg)   SpO2 98%   BMI 34.15 kg/m  GEN- NAD, alert and oriented x3 CVS- RRR, 2/6 SEM  RESP-CTAB        Assessment & Plan:      Approx 15 minutes spent with pt > 50% on counseling on diet, medication adherence for DM  Problem List Items Addressed This Visit    Hyperlipidemia   Diabetes mellitus with neurological manifestation ([place]) - Primary    Uncontrolled DM, has also caused TG to go up His Blood sugars over past few weeks were mostly 200-300s even fasting Will increase his insulin to 40units and keep at that dose Discussed dietary changes again He has seen nutritionist Unfortunately no longer with [facility]        Other Visit Diagnoses   None.     Note: This dictation was prepared with Dragon dictation along with smaller phrase technology. Any transcriptional errors that result from this process are unintentional.

## 2016-05-20 NOTE — Patient Instructions (Signed)
tAKE 40 UNITS OF Tujeo at bedtime  Carney of water  F/U end of Oct

## 2016-05-22 NOTE — Assessment & Plan Note (Signed)
Uncontrolled DM, has also caused TG to go up His Blood sugars over past few weeks were mostly 200-300s even fasting Will increase his insulin to 40units and keep at that dose Discussed dietary changes again He has seen nutritionist Unfortunately no longer with [facility]

## 2016-07-01 ENCOUNTER — Other Ambulatory Visit: Payer: Self-pay | Admitting: *Deleted

## 2016-07-01 MED ORDER — INSULIN GLARGINE 300 UNIT/ML ~~LOC~~ SOPN: 40.0000 [IU] | PEN_INJECTOR | Freq: Every day | SUBCUTANEOUS | 11 refills | Status: DC

## 2016-07-14 DIAGNOSIS — E1151 Type 2 diabetes mellitus with diabetic peripheral angiopathy without gangrene: Secondary | ICD-10-CM | POA: Diagnosis not present

## 2016-07-14 DIAGNOSIS — B351 Tinea unguium: Secondary | ICD-10-CM | POA: Diagnosis not present

## 2016-07-14 DIAGNOSIS — L84 Corns and callosities: Secondary | ICD-10-CM | POA: Diagnosis not present

## 2016-08-05 ENCOUNTER — Ambulatory Visit (INDEPENDENT_AMBULATORY_CARE_PROVIDER_SITE_OTHER): Payer: Medicare Other | Admitting: Family Medicine

## 2016-08-05 ENCOUNTER — Encounter: Payer: Self-pay | Admitting: Family Medicine

## 2016-08-05 VITALS — BP 138/86 | HR 76 | Temp 98.2°F | Resp 14 | Ht 70.0 in | Wt 249.0 lb

## 2016-08-05 DIAGNOSIS — E6609 Other obesity due to excess calories: Secondary | ICD-10-CM

## 2016-08-05 DIAGNOSIS — Z794 Long term (current) use of insulin: Secondary | ICD-10-CM

## 2016-08-05 DIAGNOSIS — Z6835 Body mass index (BMI) 35.0-35.9, adult: Secondary | ICD-10-CM | POA: Diagnosis not present

## 2016-08-05 DIAGNOSIS — M170 Bilateral primary osteoarthritis of knee: Secondary | ICD-10-CM | POA: Diagnosis not present

## 2016-08-05 DIAGNOSIS — IMO0001 Reserved for inherently not codable concepts without codable children: Secondary | ICD-10-CM

## 2016-08-05 DIAGNOSIS — I10 Essential (primary) hypertension: Secondary | ICD-10-CM | POA: Diagnosis not present

## 2016-08-05 DIAGNOSIS — Z23 Encounter for immunization: Secondary | ICD-10-CM

## 2016-08-05 DIAGNOSIS — E0840 Diabetes mellitus due to underlying condition with diabetic neuropathy, unspecified: Secondary | ICD-10-CM

## 2016-08-05 DIAGNOSIS — E0843 Diabetes mellitus due to underlying condition with diabetic autonomic (poly)neuropathy: Secondary | ICD-10-CM | POA: Diagnosis not present

## 2016-08-05 LAB — BASIC METABOLIC PANEL
BUN: 9 mg/dL (ref 7–25)
CO2: 26 mmol/L (ref 20–31)
Calcium: 9.7 mg/dL (ref 8.6–10.3)
Chloride: 101 mmol/L (ref 98–110)
Creat: 0.74 mg/dL (ref 0.70–1.33)
Glucose, Bld: 172 mg/dL — ABNORMAL HIGH (ref 70–99)
Potassium: 4.5 mmol/L (ref 3.5–5.3)
Sodium: 137 mmol/L (ref 135–146)

## 2016-08-05 LAB — HEMOGLOBIN A1C
Hgb A1c MFr Bld: 9.2 % — ABNORMAL HIGH (ref <5.7)
Mean Plasma Glucose: 217 mg/dL

## 2016-08-05 MED ORDER — INSULIN GLARGINE 300 UNIT/ML ~~LOC~~ SOPN: 40.0000 [IU] | PEN_INJECTOR | Freq: Every day | SUBCUTANEOUS | 11 refills | Status: DC

## 2016-08-05 MED ORDER — HYDROCODONE-ACETAMINOPHEN 5-325 MG PO TABS: 1.0000 | ORAL_TABLET | Freq: Two times a day (BID) | ORAL | 0 refills | Status: DC | PRN

## 2016-08-05 NOTE — Assessment & Plan Note (Signed)
His gassy importance of keeping his weight down which also help with his knee pain. He has known degenerative joint disease. Orthopedics a few years back. Now with increased pain and swelling episodes. We'll get him set up to see or so again I have refilled his hydrocodone

## 2016-08-05 NOTE — Addendum Note (Signed)
Addended by: Vic Blackbird F on: 08/05/2016 01:40 PM   Modules accepted: Orders

## 2016-08-05 NOTE — Patient Instructions (Signed)
F/U 3 months  Referral to orthopedics

## 2016-08-05 NOTE — Progress Notes (Signed)
   Subjective:    Patient ID: Kevin [name], male    DOB: [birth year], 60 y.o.   MRN: [id number]  Patient presents for Follow-up (is not fasting)  Patient here to follow-up chronic medical problems. Diabetes is uncontrolled at the last visit I had him take 40 units of Tujeo,MTF Weight up 11lbs since August ,last A1C 13.3% In July, TG elevated at 225, LDL 106 CBG Running-Per report 107-115  no hypoglycemia he states that they were running late to get to the transportation bus that he left his meter  He continues to have a lot of pain with his right knee is been popping a lot swelling. He requested a refill on his hydrocodone which she does not use often. He would also like to see someone about his knee    Flu shot given  Review Of Systems:  GEN- denies fatigue, fever, weight loss,weakness, recent illness HEENT- denies eye drainage, change in vision, nasal discharge, CVS- denies chest pain, palpitations RESP- denies SOB, cough, wheeze ABD- denies N/V, change in stools, abd pain GU- denies dysuria, hematuria, dribbling, incontinence MSK- + joint pain, muscle aches, injury Neuro- denies headache, dizziness, syncope, seizure activity       Objective:    BP 138/86 (BP Location: Left Arm, Patient Position: Sitting, Cuff Size: Large)   Pulse 76   Temp 98.2 F (36.8 C) (Oral)   Resp 14   Ht 5\' 10"  (1.778 m)   Wt 249 lb (112.9 kg)   SpO2 98%   BMI 35.73 kg/m  GEN- NAD, alert and oriented x3 HEENT- PERRL, EOMI, non injected sclera, pink conjunctiva, MMM, oropharynx clear CVS- RRR, 2/6SEM RESP-CTAB EXT- No edema MSK-bilat knee-normal inspection no effusion positive crepitus and decreased range of motion Pulses- Radial, DP- 2+        Assessment & Plan:      Problem List Items Addressed This Visit    Obesity   OA (osteoarthritis) of knee    His gassy importance of keeping his weight down which also help with his knee pain. He has known degenerative joint disease.  Orthopedics a few years back. Now with increased pain and swelling episodes. We'll get him set up to see or so again I have refilled his hydrocodone      Relevant Medications   HYDROcodone-acetaminophen (NORCO/VICODIN) 5-325 MG tablet   Other Relevant Orders   Ambulatory referral to Orthopedic Surgery   Neuropathy, diabetic ([place])   Essential hypertension, benign    Blood pressure looks okay no change in medication      Diabetes mellitus with neurological manifestation ([place]) - Primary    Diabetes mellitus which has been uncontrolled recheck his A1c today states that he has been injecting his insulin as prescribed and taking all his medications. Goal A1c less than 7%. He is on ACE inhibitor and statin drug      Relevant Orders   Basic metabolic panel   Hemoglobin A1c    Other Visit Diagnoses    Need for prophylactic vaccination and inoculation against influenza       Relevant Orders   Flu Vaccine QUAD 36+ mos PF IM (Fluarix & Fluzone Quad PF) (Completed)      Note: This dictation was prepared with Dragon dictation along with smaller phrase technology. Any transcriptional errors that result from this process are unintentional.

## 2016-08-05 NOTE — Assessment & Plan Note (Signed)
Blood pressure looks okay no change in medication 

## 2016-08-05 NOTE — Assessment & Plan Note (Signed)
Diabetes mellitus which has been uncontrolled recheck his A1c today states that he has been injecting his insulin as prescribed and taking all his medications. Goal A1c less than 7%. He is on ACE inhibitor and statin drug

## 2016-08-11 ENCOUNTER — Other Ambulatory Visit: Payer: Self-pay | Admitting: *Deleted

## 2016-08-11 MED ORDER — INSULIN GLARGINE 300 UNIT/ML ~~LOC~~ SOPN: 42.0000 [IU] | PEN_INJECTOR | Freq: Every day | SUBCUTANEOUS | 11 refills | Status: DC

## 2016-08-16 ENCOUNTER — Other Ambulatory Visit: Payer: Self-pay | Admitting: Family Medicine

## 2016-08-16 DIAGNOSIS — E119 Type 2 diabetes mellitus without complications: Secondary | ICD-10-CM | POA: Diagnosis not present

## 2016-08-16 DIAGNOSIS — E7800 Pure hypercholesterolemia, unspecified: Secondary | ICD-10-CM | POA: Diagnosis not present

## 2016-08-16 DIAGNOSIS — I10 Essential (primary) hypertension: Secondary | ICD-10-CM | POA: Diagnosis not present

## 2016-08-16 LAB — HM DIABETES EYE EXAM

## 2016-08-18 NOTE — Telephone Encounter (Signed)
He just received this, it has not been 30 days

## 2016-08-18 NOTE — Telephone Encounter (Signed)
Ok to refill??  Last office visit/ refill 08/05/2016.

## 2016-08-31 ENCOUNTER — Ambulatory Visit (INDEPENDENT_AMBULATORY_CARE_PROVIDER_SITE_OTHER): Payer: Medicaid Other | Admitting: Orthopaedic Surgery

## 2016-09-14 ENCOUNTER — Ambulatory Visit (INDEPENDENT_AMBULATORY_CARE_PROVIDER_SITE_OTHER): Payer: Medicaid Other | Admitting: Orthopaedic Surgery

## 2016-09-21 ENCOUNTER — Ambulatory Visit (INDEPENDENT_AMBULATORY_CARE_PROVIDER_SITE_OTHER): Payer: Medicaid Other | Admitting: Orthopaedic Surgery

## 2016-09-22 DIAGNOSIS — B351 Tinea unguium: Secondary | ICD-10-CM | POA: Diagnosis not present

## 2016-09-22 DIAGNOSIS — L84 Corns and callosities: Secondary | ICD-10-CM | POA: Diagnosis not present

## 2016-09-22 DIAGNOSIS — E1151 Type 2 diabetes mellitus with diabetic peripheral angiopathy without gangrene: Secondary | ICD-10-CM | POA: Diagnosis not present

## 2016-10-11 ENCOUNTER — Ambulatory Visit (INDEPENDENT_AMBULATORY_CARE_PROVIDER_SITE_OTHER): Payer: Medicaid Other | Admitting: Orthopaedic Surgery

## 2016-10-13 ENCOUNTER — Ambulatory Visit (INDEPENDENT_AMBULATORY_CARE_PROVIDER_SITE_OTHER): Payer: Medicaid Other | Admitting: Physician Assistant

## 2016-10-14 ENCOUNTER — Other Ambulatory Visit: Payer: Self-pay | Admitting: Family Medicine

## 2016-11-07 ENCOUNTER — Encounter: Payer: Self-pay | Admitting: Family Medicine

## 2016-11-07 ENCOUNTER — Ambulatory Visit (INDEPENDENT_AMBULATORY_CARE_PROVIDER_SITE_OTHER): Payer: Medicare Other | Admitting: Family Medicine

## 2016-11-07 VITALS — BP 136/72 | HR 70 | Temp 98.4°F | Resp 18 | Ht 70.0 in | Wt 248.0 lb

## 2016-11-07 DIAGNOSIS — E0843 Diabetes mellitus due to underlying condition with diabetic autonomic (poly)neuropathy: Secondary | ICD-10-CM

## 2016-11-07 DIAGNOSIS — Z6835 Body mass index (BMI) 35.0-35.9, adult: Secondary | ICD-10-CM

## 2016-11-07 DIAGNOSIS — E6609 Other obesity due to excess calories: Secondary | ICD-10-CM | POA: Diagnosis not present

## 2016-11-07 DIAGNOSIS — M4802 Spinal stenosis, cervical region: Secondary | ICD-10-CM

## 2016-11-07 DIAGNOSIS — M5030 Other cervical disc degeneration, unspecified cervical region: Secondary | ICD-10-CM | POA: Diagnosis not present

## 2016-11-07 DIAGNOSIS — Z794 Long term (current) use of insulin: Secondary | ICD-10-CM

## 2016-11-07 DIAGNOSIS — E0840 Diabetes mellitus due to underlying condition with diabetic neuropathy, unspecified: Secondary | ICD-10-CM

## 2016-11-07 DIAGNOSIS — IMO0001 Reserved for inherently not codable concepts without codable children: Secondary | ICD-10-CM

## 2016-11-07 DIAGNOSIS — E782 Mixed hyperlipidemia: Secondary | ICD-10-CM | POA: Diagnosis not present

## 2016-11-07 LAB — LIPID PANEL
Cholesterol: 209 mg/dL — ABNORMAL HIGH (ref <200)
HDL: 37 mg/dL — ABNORMAL LOW (ref >40)
LDL Cholesterol: 137 mg/dL — ABNORMAL HIGH (ref <100)
Total CHOL/HDL Ratio: 5.6 Ratio — ABNORMAL HIGH (ref <5.0)
Triglycerides: 173 mg/dL — ABNORMAL HIGH (ref <150)
VLDL: 35 mg/dL — ABNORMAL HIGH (ref <30)

## 2016-11-07 LAB — COMPREHENSIVE METABOLIC PANEL
ALT: 35 U/L (ref 9–46)
AST: 29 U/L (ref 10–35)
Albumin: 4.2 g/dL (ref 3.6–5.1)
Alkaline Phosphatase: 79 U/L (ref 40–115)
BUN: 7 mg/dL (ref 7–25)
CO2: 26 mmol/L (ref 20–31)
Calcium: 9.8 mg/dL (ref 8.6–10.3)
Chloride: 101 mmol/L (ref 98–110)
Creat: 0.75 mg/dL (ref 0.70–1.33)
Glucose, Bld: 329 mg/dL — ABNORMAL HIGH (ref 70–99)
Potassium: 4.8 mmol/L (ref 3.5–5.3)
Sodium: 137 mmol/L (ref 135–146)
Total Bilirubin: 0.5 mg/dL (ref 0.2–1.2)
Total Protein: 7.8 g/dL (ref 6.1–8.1)

## 2016-11-07 LAB — CBC WITH DIFFERENTIAL/PLATELET
Basophils Absolute: 90 cells/uL (ref 0–200)
Basophils Relative: 1 %
Eosinophils Absolute: 360 cells/uL (ref 15–500)
Eosinophils Relative: 4 %
HCT: 48.8 % (ref 38.5–50.0)
Hemoglobin: 16.6 g/dL (ref 13.0–17.0)
Lymphocytes Relative: 25 %
Lymphs Abs: 2250 cells/uL (ref 850–3900)
MCH: 32.5 pg (ref 27.0–33.0)
MCHC: 34 g/dL (ref 32.0–36.0)
MCV: 95.5 fL (ref 80.0–100.0)
MPV: 10.8 fL (ref 7.5–12.5)
Monocytes Absolute: 540 cells/uL (ref 200–950)
Monocytes Relative: 6 %
Neutro Abs: 5760 cells/uL (ref 1500–7800)
Neutrophils Relative %: 64 %
Platelets: 210 10*3/uL (ref 140–400)
RBC: 5.11 MIL/uL (ref 4.20–5.80)
RDW: 12.5 % (ref 11.0–15.0)
WBC: 9 10*3/uL (ref 3.8–10.8)

## 2016-11-07 LAB — HEMOGLOBIN A1C
Hgb A1c MFr Bld: 9.8 % — ABNORMAL HIGH (ref <5.7)
Mean Plasma Glucose: 235 mg/dL

## 2016-11-07 MED ORDER — HYDROCODONE-ACETAMINOPHEN 5-325 MG PO TABS: 1.0000 | ORAL_TABLET | Freq: Two times a day (BID) | ORAL | 0 refills | Status: DC | PRN

## 2016-11-07 MED ORDER — GABAPENTIN 100 MG PO CAPS: ORAL_CAPSULE | ORAL | 3 refills | Status: DC

## 2016-11-07 NOTE — Assessment & Plan Note (Signed)
Check lipids on statin drug  

## 2016-11-07 NOTE — Progress Notes (Signed)
Subjective:    Patient ID: Kevin [name], male    DOB: 05-Apr-1956, 61 y.o.   MRN: [id number]  Patient presents for 3 month F/U (is not fasting) Here to follow chronic medical problems. His only concern is his chronic neck pain he states he is getting more weakness and numbness in his left hand he has known cervical disc disease he had an MRI of the C-spine back in January concern for spinal stenosis at multiple levels causing his symptoms. He was scheduled to see a neurosurgeon but did not make that appointment.  Diabetes mellitus his last A1c 9.2% states that he is taking 42 units of his insulin at bedtime he is taking all of his oral medications as prescribed. He did not bring his meter with him today states that his blood sugars have been less than 120 fasting his weight is unchanged.  Hypertension taking blood pressure medicine as prescribed as well as his statin drug    Review Of Systems:  GEN- denies fatigue, fever, weight loss,weakness, recent illness HEENT- denies eye drainage, change in vision, nasal discharge, CVS- denies chest pain, palpitations RESP- denies SOB, cough, wheeze ABD- denies N/V, change in stools, abd pain GU- denies dysuria, hematuria, dribbling, incontinence MSK- + joint pain, muscle aches, injury Neuro- denies headache, dizziness, syncope, seizure activity       Objective:    BP 136/72 (BP Location: Left Arm, Patient Position: Sitting, Cuff Size: Large)   Pulse 70   Temp 98.4 F (36.9 C) (Oral)   Resp 18   Ht 5\' 10"  (1.778 m)   Wt 248 lb (112.5 kg)   SpO2 97%   BMI 35.58 kg/m  GEN- NAD, alert and oriented x3 HEENT- PERRL, EOMI, non injected sclera, pink conjunctiva, MMM, oropharynx clear Neck- Supple, decreaesd ROM, neg spurlings  CVS- RRR, no murmur RESP-CTAB ABD-NABS,soft,NT,ND Neuro- decreased grasp in left hand compared to right, normal tone UE, sensation grossly in tact, strength Upper arm equal bilat  EXT- No edema Pulses- Radial,  DP- 2+        Assessment & Plan:      Problem List Items Addressed This Visit    Obesity   Neuropathy, diabetic ([place])   Hyperlipidemia - Primary    Check lipids on statin drug      Diabetes mellitus with neurological manifestation ([facility])    Diabetes has been uncontrolled recently. We'll recheck his A1c goal is to get him less than 7% again. Continue with injection of insulin. Following nutrition has been difficult some of this is due to finances.      Relevant Orders   CBC with Differential/Platelet   Comprehensive metabolic panel   Hemoglobin A1c   Lipid panel   DDD (degenerative disc disease), cervical    Known degenerative disc disease with some narrowing in the canal. I think this is contributing to the worsening symptoms on his left side. We'll get him set up once again to see a specialist. In the meantime I'll start gabapentin at bedtime 1-200 mg by mouth to have also refilled his pain medication      Relevant Medications   HYDROcodone-acetaminophen (NORCO/VICODIN) 5-325 MG tablet   Other Relevant Orders   Ambulatory referral to Orthopedic Surgery    Other Visit Diagnoses    Cervical spinal stenosis       Relevant Orders   Ambulatory referral to Orthopedic Surgery      Note: This dictation was prepared with Dragon dictation along with smaller phrase technology.  Any transcriptional errors that result from this process are unintentional.

## 2016-11-07 NOTE — Patient Instructions (Signed)
Take the pain medicine as needed Get the nerve pill on Friday  We will call with lab results F/U 4 months

## 2016-11-07 NOTE — Assessment & Plan Note (Signed)
Diabetes has been uncontrolled recently. We'll recheck his A1c goal is to get him less than 7% again. Continue with injection of insulin. Following nutrition has been difficult some of this is due to finances.

## 2016-11-07 NOTE — Assessment & Plan Note (Signed)
Known degenerative disc disease with some narrowing in the canal. I think this is contributing to the worsening symptoms on his left side. We'll get him set up once again to see a specialist. In the meantime I'll start gabapentin at bedtime 1-200 mg by mouth to have also refilled his pain medication

## 2016-11-11 ENCOUNTER — Other Ambulatory Visit: Payer: Self-pay | Admitting: *Deleted

## 2016-11-11 MED ORDER — METFORMIN HCL 1000 MG PO TABS: 1000.0000 mg | ORAL_TABLET | Freq: Two times a day (BID) | ORAL | 3 refills | Status: DC

## 2016-11-11 MED ORDER — ATORVASTATIN CALCIUM 20 MG PO TABS: 20.0000 mg | ORAL_TABLET | Freq: Every day | ORAL | 3 refills | Status: DC

## 2016-11-11 MED ORDER — POTASSIUM CHLORIDE ER 10 MEQ PO TBCR: 10.0000 meq | EXTENDED_RELEASE_TABLET | Freq: Two times a day (BID) | ORAL | 3 refills | Status: DC

## 2016-11-11 MED ORDER — SITAGLIPTIN PHOSPHATE 100 MG PO TABS: 100.0000 mg | ORAL_TABLET | Freq: Every day | ORAL | 3 refills | Status: DC

## 2016-11-11 MED ORDER — FOLIC ACID 1 MG PO TABS: 1.0000 mg | ORAL_TABLET | Freq: Every day | ORAL | 3 refills | Status: DC

## 2016-11-11 MED ORDER — FUROSEMIDE 40 MG PO TABS
40.0000 mg | ORAL_TABLET | Freq: Two times a day (BID) | ORAL | 3 refills | Status: DC
Start: 2016-11-11 — End: 2019-03-29

## 2016-11-11 MED ORDER — FLUOXETINE HCL 20 MG PO CAPS: 20.0000 mg | ORAL_CAPSULE | Freq: Every day | ORAL | 3 refills | Status: DC

## 2016-11-11 MED ORDER — AMLODIPINE BESYLATE 5 MG PO TABS: 5.0000 mg | ORAL_TABLET | Freq: Every day | ORAL | 1 refills | Status: DC

## 2016-11-11 MED ORDER — CETIRIZINE HCL 10 MG PO TABS: 10.0000 mg | ORAL_TABLET | Freq: Every day | ORAL | 3 refills | Status: DC

## 2016-12-01 ENCOUNTER — Ambulatory Visit (INDEPENDENT_AMBULATORY_CARE_PROVIDER_SITE_OTHER): Payer: Medicare Other

## 2016-12-01 ENCOUNTER — Ambulatory Visit (INDEPENDENT_AMBULATORY_CARE_PROVIDER_SITE_OTHER): Payer: Medicare Other | Admitting: Orthopaedic Surgery

## 2016-12-01 ENCOUNTER — Encounter (INDEPENDENT_AMBULATORY_CARE_PROVIDER_SITE_OTHER): Payer: Self-pay | Admitting: Orthopaedic Surgery

## 2016-12-01 VITALS — BP 110/70 | HR 96 | Ht 68.0 in | Wt 240.0 lb

## 2016-12-01 DIAGNOSIS — M542 Cervicalgia: Secondary | ICD-10-CM

## 2016-12-01 DIAGNOSIS — M25561 Pain in right knee: Secondary | ICD-10-CM

## 2016-12-01 DIAGNOSIS — M1711 Unilateral primary osteoarthritis, right knee: Secondary | ICD-10-CM | POA: Diagnosis not present

## 2016-12-01 MED ORDER — LIDOCAINE HCL 1 % IJ SOLN
1.0000 mL | INTRAMUSCULAR | Status: AC | PRN
  Administered 2016-12-01: 1 mL

## 2016-12-01 MED ORDER — BUPIVACAINE HCL 0.25 % IJ SOLN
0.6600 mL | INTRAMUSCULAR | Status: AC | PRN
  Administered 2016-12-01: .66 mL via INTRA_ARTICULAR

## 2016-12-01 MED ORDER — METHYLPREDNISOLONE ACETATE 40 MG/ML IJ SUSP
40.0000 mg | INTRAMUSCULAR | Status: AC | PRN
  Administered 2016-12-01: 40 mg via INTRA_ARTICULAR

## 2016-12-01 NOTE — Progress Notes (Signed)
Office Visit Note/Orthopedic Consultation   Patient: Kevin [name]           Date of Birth: 09-19-1956           MRN: [id number] Visit Date: 12/01/2016              Requested by: Alycia Rossetti, MD [address] [place], [place] [address] PCP: Vic Blackbird, MD   Assessment & Plan: Visit Diagnoses:  1. Neck pain   2. Right knee pain, unspecified chronicity   3. Unilateral primary osteoarthritis, right knee     C6-7 foraminal stenosis worse on the left than right. Advanced right knee osteoarthritis. Plan: Injection right knee performed with cortisone with some improvement in the symptoms he is able ambulate better. He has significant spondylosis in his neck and will set him up for some physical therapy for evaluation and treatment and recheck him in 4-5 weeks. He is not better we'll need to talk with a sister a telephone and discuss with her Kevin [name]'s problems and potential options for surgical treatment if he does not improve with physical therapy. We discussed his severe knee arthritis and options for treatment of this as well. We will recheck him in 4 weeks. Thank you for applying me to share in his care. Follow-Up Instructions: Return in about 4 weeks (around 12/29/2016).   Orders:  Orders Placed This Encounter  Procedures  . Large Joint Injection/Arthrocentesis  . XR Cervical Spine 2 or 3 views  . XR KNEE 3 VIEW RIGHT   No orders of the defined types were placed in this encounter.     Procedures: Large Joint Inj Date/Time: 12/01/2016 10:20 PM Performed by: Marybelle Killings Authorized by: Rodell Perna C   Consent Given by:  Patient Indications:  Pain and joint swelling Location:  Knee Site:  R knee Needle Size:  22 G Needle Length:  1.5 inches Approach:  Anterolateral Ultrasound Guidance: No   Fluoroscopic Guidance: No   Arthrogram: No   Medications:  1 mL lidocaine 1 %; 40 mg methylPREDNISolone acetate 40 MG/ML; 0.66 mL bupivacaine 0.25 % Aspiration  Attempted: No   Patient tolerance:  Patient tolerated the procedure well with no immediate complications     Clinical Data: No additional findings.   Subjective: Chief Complaint  Patient presents with  . Neck - Pain  . Right Knee - Pain    Patient presents with neck pain and right knee pain. He has had a cervical MRI done at [facility] in 2017. The patient has cervical stenosis and pain that radiates down his left arm. He also states that the right knee is "popping, and giving way".    Patient is here with his brother who gives additional history with the better recollection and the patient. They have a sister lives elsewhere who makes most of their medical decisions. Patient's had right knee progressive pain in his had pain in his neck left arm numbness in his hand has been wearing a splint on his wrist to help with the shaking and weakness in his wrist.  Review of Systems  Constitutional: Negative for chills and diaphoresis.  HENT: Negative for ear discharge, ear pain and nosebleeds.   Eyes: Negative for discharge and visual disturbance.  Respiratory: Negative for cough, choking and shortness of breath.   Cardiovascular: Negative for chest pain and palpitations.  Gastrointestinal: Negative for abdominal distention and abdominal pain.  Endocrine: Negative for cold intolerance and heat intolerance.  Positive for diabetes with neuropathy  Genitourinary: Negative for flank pain and hematuria.  Musculoskeletal:       Right knee advanced primary osteoarthritis right knee. Chronic neck pain and left arm pain.  Skin: Negative for rash and wound.  Neurological: Negative for seizures and speech difficulty.  Hematological: Negative for adenopathy. Does not bruise/bleed easily.  Psychiatric/Behavioral: Negative for agitation and suicidal ideas.     Objective: Vital Signs: BP 110/70   Pulse 96   Ht 5\' 8"  (1.727 m)   Wt 240 lb (108.9 kg)   BMI 36.49 kg/m   Physical Exam    Constitutional: He is oriented to person, place, and time. He appears well-developed and well-nourished.  HENT:  Head: Normocephalic and atraumatic.  Eyes: EOM are normal. Pupils are equal, round, and reactive to light.  Neck: No tracheal deviation present. No thyromegaly present.  Cardiovascular: Normal rate.   Pulmonary/Chest: Effort normal. He has no wheezes.  Abdominal: Soft. Bowel sounds are normal.  Musculoskeletal:  Decreased sensation both lower extremities feet and ankles consistent with neuropathy no plantar foot lesions. Right knee crepitus with range of motion lateral joint line tenderness 2+ knee effusion. Normal patellar tracking with crepitus. Positive Spurling on the left positive brachial plexus tenderness more on the left than right. Absent brachioradialis reflex left. Weakness left wrist extension with normal right wrist extension strength. No thenar or hyperthenar atrophy. Biceps triceps shoulder exam is normal. Reflexes on the right side biceps triceps brachioradialis are normal. Crepitus with knee range of motion ankle range of motion straight leg raising is negative normal hip range of motion.  Neurological: He is alert and oriented to person, place, and time.  Skin: Skin is warm and dry. Capillary refill takes less than 2 seconds.  Psychiatric: He has a normal mood and affect. His behavior is normal. Judgment and thought content normal.    Ortho Exam  Specialty Comments:  No specialty comments available.  Imaging:  10/21/2015 1:14 PM 10/21/2015 2:05 PM  PACS Images   Show images for MR Cervical Spine Wo Contrast  Addendum   ADDENDUM REPORT: 10/21/2015 15:49 ADDENDUM: Asymmetric facet degenerative changes on the left contribute to mild left foraminal narrowing. There is no significant disc protrusion or uncovertebral disease. Electronically Signed   By: San Morelle M.D.   On: 10/21/2015 15:49  Addended by San Morelle, MD on 10/21/2015 3:51  PM    Study Result   CLINICAL DATA:  Neck pain extending to left shoulder for 1 year. The examination had to be discontinued prior to completion due to patient refusal of further imaging.  EXAM: MRI CERVICAL SPINE WITHOUT CONTRAST  TECHNIQUE: Multiplanar, multisequence MR imaging of the cervical spine was performed. No intravenous contrast was administered.  COMPARISON:  MRI brain 01/11/2013.  FINDINGS: Normal signal is present in the cervical and upper thoracic spinal cord to the lowest imaged level, T1-2. A small hemangioma is present at C4. Vertebral body heights and alignment are maintained.  The craniocervical junction is within normal limits. Flow is present in the vertebral arteries bilaterally. Left vertebral artery is dominant.  C2-3:  Negative.  C3-4: Uncovertebral spurring leads to mild foraminal narrowing, left greater than right.  C4-5: A mild disc osteophyte complex is present. Facet hypertrophy contributes to moderate left and mild right foraminal stenosis.  C5-6: A broad-based disc osteophyte complex is present. Mild to moderate left and mild right foraminal stenosis is present.  C6-7: A broad-based disc osteophyte complex is present. Uncovertebral spurring is  noted bilaterally. Moderate to severe foraminal stenosis is present bilaterally, worse on the left. There is effacement of the ventral CSF.  C7-T1: A superior endplate Schmorl's node is present at T1. There is no significant disc protrusion or stenosis.  Mild disc disease is present at T1-2 and T2-3 without significant stenosis.  IMPRESSION: 1. Study is limited by a only 2 sagittal sequences. No axial imaging was performed due to patient refusal of further imaging. 2. Multilevel disease is most severe at C6-7 with moderate to severe foraminal stenosis bilaterally, worse on the left. 3. Moderate central canal narrowing at C6-7. 4. Moderate left and mild right foraminal narrowing  at C4-5 and C5-6. 5. Mild foraminal narrowing bilaterally at C3-4 due to uncovertebral disease, worse on the left.  Electronically Signed: By: San Morelle M.D. On: 10/21/2015 14:11        PMFS History: Patient Active Problem List   Diagnosis Date Noted  . Neck pain 12/01/2016  . DDD (degenerative disc disease), cervical 09/07/2015  . Musculoskeletal pain 05/29/2014  . Black eye, traumatic 02/17/2014  . Seasonal allergies 02/17/2014  . Onychomycosis 11/19/2013  . Dental abscess 09/27/2013  . Illiteracy and low-level literacy 05/27/2013  . Unspecified sleep apnea 05/15/2013  . Tremor 08/07/2012  . Elevated LFTs 08/07/2012  . Anxiety 06/27/2012  . Colon cancer screening 04/19/2012  . Macrocytosis without anemia 04/19/2012  . Esophageal dysphagia 03/27/2012  . OA (osteoarthritis) of knee 01/18/2012  . Diabetes mellitus with neurological manifestation ([place]) 12/23/2011  . Neuropathy, diabetic ([place]) 12/23/2011  . Leg pain 12/23/2011  . Obesity 12/11/2011  . Right knee pain 12/11/2011  . Leg edema 12/11/2011  . Essential hypertension, benign 12/08/2011  . Hyperlipidemia 12/08/2011   Past Medical History:  Diagnosis Date  . Arthritis   . Chronic knee pain   . Depression   . Diabetes mellitus ([place])   . Hyperlipidemia   . Hypertension   . Leg edema   . Obesity     Family History  Problem Relation Age of Onset  . Heart disease    . Arthritis    . Diabetes    . Cancer Mother     deceased    Past Surgical History:  Procedure Laterality Date  . none     Social History   Occupational History  . Not on file.   Social History Main Topics  . Smoking status: Current Every Day Smoker    Packs/day: 0.20  . Smokeless tobacco: Never Used  . Alcohol use No  . Drug use: No  . Sexual activity: Not Currently

## 2016-12-19 ENCOUNTER — Encounter (HOSPITAL_COMMUNITY): Payer: Self-pay

## 2016-12-19 ENCOUNTER — Ambulatory Visit (HOSPITAL_COMMUNITY): Payer: 59 | Admitting: Physical Therapy

## 2016-12-28 ENCOUNTER — Ambulatory Visit (HOSPITAL_COMMUNITY): Payer: Medicare Other | Attending: Orthopaedic Surgery

## 2016-12-28 DIAGNOSIS — G8929 Other chronic pain: Secondary | ICD-10-CM | POA: Diagnosis present

## 2016-12-28 DIAGNOSIS — M25561 Pain in right knee: Secondary | ICD-10-CM | POA: Diagnosis present

## 2016-12-28 DIAGNOSIS — M25661 Stiffness of right knee, not elsewhere classified: Secondary | ICD-10-CM

## 2016-12-28 DIAGNOSIS — R262 Difficulty in walking, not elsewhere classified: Secondary | ICD-10-CM | POA: Insufficient documentation

## 2016-12-28 DIAGNOSIS — R600 Localized edema: Secondary | ICD-10-CM | POA: Diagnosis present
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
## 2017-01-02 ENCOUNTER — Encounter: Payer: Self-pay | Admitting: Family Medicine

## 2017-01-05 ENCOUNTER — Ambulatory Visit (HOSPITAL_COMMUNITY): Payer: Medicare Other

## 2017-01-05 ENCOUNTER — Ambulatory Visit (INDEPENDENT_AMBULATORY_CARE_PROVIDER_SITE_OTHER): Payer: Medicare Other | Admitting: Orthopaedic Surgery

## 2017-01-05 DIAGNOSIS — M25561 Pain in right knee: Principal | ICD-10-CM

## 2017-01-05 DIAGNOSIS — R600 Localized edema: Secondary | ICD-10-CM

## 2017-01-05 DIAGNOSIS — G8929 Other chronic pain: Secondary | ICD-10-CM

## 2017-01-05 DIAGNOSIS — M25661 Stiffness of right knee, not elsewhere classified: Secondary | ICD-10-CM

## 2017-01-05 DIAGNOSIS — R262 Difficulty in walking, not elsewhere classified: Secondary | ICD-10-CM
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
## 2017-01-09 ENCOUNTER — Encounter (HOSPITAL_COMMUNITY): Payer: 59 | Admitting: Physical Therapy

## 2017-01-11 ENCOUNTER — Ambulatory Visit (HOSPITAL_COMMUNITY): Payer: Medicare Other | Attending: Orthopaedic Surgery

## 2017-01-11 ENCOUNTER — Telehealth (HOSPITAL_COMMUNITY): Payer: Self-pay | Admitting: Physical Therapy

## 2017-01-11 VITALS — BP 129/72 | HR 87

## 2017-01-11 DIAGNOSIS — R600 Localized edema: Secondary | ICD-10-CM

## 2017-01-11 DIAGNOSIS — M25561 Pain in right knee: Secondary | ICD-10-CM | POA: Diagnosis present

## 2017-01-11 DIAGNOSIS — M25661 Stiffness of right knee, not elsewhere classified: Secondary | ICD-10-CM | POA: Diagnosis present

## 2017-01-11 DIAGNOSIS — G8929 Other chronic pain: Secondary | ICD-10-CM

## 2017-01-11 DIAGNOSIS — R262 Difficulty in walking, not elsewhere classified: Secondary | ICD-10-CM | POA: Diagnosis present

## 2017-01-11 NOTE — Telephone Encounter (Signed)
Pt requested that his apptments be changed to 1pm or after. He has a home health nurse that will come at that time. I talked with the pt and his nurse and it was agreed that [facility]'s apptment here would remain at the same time,so he would be home @1pm 
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
## 2017-01-16 ENCOUNTER — Telehealth (HOSPITAL_COMMUNITY): Payer: Self-pay | Admitting: Family Medicine

## 2017-01-16 ENCOUNTER — Ambulatory Visit (HOSPITAL_COMMUNITY): Payer: Medicare Other | Admitting: Physical Therapy

## 2017-01-16 DIAGNOSIS — R600 Localized edema: Secondary | ICD-10-CM

## 2017-01-16 DIAGNOSIS — G8929 Other chronic pain: Secondary | ICD-10-CM

## 2017-01-16 DIAGNOSIS — R262 Difficulty in walking, not elsewhere classified: Secondary | ICD-10-CM

## 2017-01-16 DIAGNOSIS — M25661 Stiffness of right knee, not elsewhere classified: Secondary | ICD-10-CM

## 2017-01-16 DIAGNOSIS — M25561 Pain in right knee: Principal | ICD-10-CM

## 2017-01-16 NOTE — Telephone Encounter (Signed)
01/16/17 pt left a message to cx the rest of the weeks appointments.  He said he had to go out of town for an emergency and wouldn't be here the rest of the week.
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
## 2017-01-18 ENCOUNTER — Ambulatory Visit (HOSPITAL_COMMUNITY): Payer: Medicare Other | Admitting: Physical Therapy

## 2017-01-19 ENCOUNTER — Telehealth: Payer: Self-pay | Admitting: *Deleted

## 2017-01-19 DIAGNOSIS — R195 Other fecal abnormalities: Secondary | ICD-10-CM

## 2017-01-19 NOTE — Telephone Encounter (Signed)
Received results of FIT testing from [facility].   Noted blood in stool.   MD made aware and new orders obtained for colonoscopy.   Referral orders placed.   Call placed to patient and patient made aware. Patient agreeable to referral.

## 2017-01-20 ENCOUNTER — Encounter (INDEPENDENT_AMBULATORY_CARE_PROVIDER_SITE_OTHER): Payer: Self-pay | Admitting: Internal Medicine

## 2017-01-20 ENCOUNTER — Ambulatory Visit (HOSPITAL_COMMUNITY): Payer: Medicare Other

## 2017-01-23 ENCOUNTER — Telehealth (HOSPITAL_COMMUNITY): Payer: Self-pay

## 2017-01-23 ENCOUNTER — Ambulatory Visit (HOSPITAL_COMMUNITY): Payer: Medicare Other

## 2017-01-23 NOTE — Telephone Encounter (Signed)
No show #1. Pt stated that he was still out of town and he thought he cancelled this appointment. When asked if he planned on attending his next appointment on Wednesday, 4/18, he stated no, that he didn't think he needed to come back to therapy anymore because his knee is feeling much better. He states he is still doing his HEP and wishes to be discharged. PT educated pt that if he notices a significant decline over the next few months that he could come back with referral and he verbalized understanding.   Geraldine Solar PT, DPT

## 2017-01-25 ENCOUNTER — Ambulatory Visit (HOSPITAL_COMMUNITY): Payer: Medicare Other

## 2017-01-27 ENCOUNTER — Ambulatory Visit (HOSPITAL_COMMUNITY): Payer: Medicare Other | Admitting: Physical Therapy

## 2017-02-01 ENCOUNTER — Ambulatory Visit (INDEPENDENT_AMBULATORY_CARE_PROVIDER_SITE_OTHER): Payer: 59 | Admitting: Internal Medicine

## 2017-02-06 ENCOUNTER — Ambulatory Visit (INDEPENDENT_AMBULATORY_CARE_PROVIDER_SITE_OTHER): Payer: Medicare Other | Admitting: Family Medicine

## 2017-02-06 ENCOUNTER — Encounter: Payer: Self-pay | Admitting: Family Medicine

## 2017-02-06 VITALS — BP 134/78 | HR 78 | Temp 98.2°F | Resp 14 | Ht 68.0 in | Wt 243.0 lb

## 2017-02-06 DIAGNOSIS — M170 Bilateral primary osteoarthritis of knee: Secondary | ICD-10-CM | POA: Diagnosis not present

## 2017-02-06 DIAGNOSIS — I10 Essential (primary) hypertension: Secondary | ICD-10-CM

## 2017-02-06 DIAGNOSIS — E0840 Diabetes mellitus due to underlying condition with diabetic neuropathy, unspecified: Secondary | ICD-10-CM | POA: Diagnosis not present

## 2017-02-06 DIAGNOSIS — Z794 Long term (current) use of insulin: Secondary | ICD-10-CM | POA: Diagnosis not present

## 2017-02-06 DIAGNOSIS — Z6836 Body mass index (BMI) 36.0-36.9, adult: Secondary | ICD-10-CM | POA: Diagnosis not present

## 2017-02-06 DIAGNOSIS — E782 Mixed hyperlipidemia: Secondary | ICD-10-CM

## 2017-02-06 DIAGNOSIS — E66812 Obesity, class 2: Secondary | ICD-10-CM

## 2017-02-06 MED ORDER — HYDROCODONE-ACETAMINOPHEN 5-325 MG PO TABS: 1.0000 | ORAL_TABLET | Freq: Two times a day (BID) | ORAL | 0 refills | Status: DC | PRN

## 2017-02-06 NOTE — Patient Instructions (Addendum)
F/U for Physical  We will call with lab results  45 units of Tujeo

## 2017-02-06 NOTE — Assessment & Plan Note (Signed)
Pain medication refilled No sign of overuse/abuse

## 2017-02-06 NOTE — Assessment & Plan Note (Signed)
Fairly controlled, unclear if he is taking meds He has already had HH and THN in past

## 2017-02-06 NOTE — Progress Notes (Signed)
   Subjective:    Patient ID: Kevin [name], male    DOB: 1956-05-01, 61 y.o.   MRN: [id number]  Patient presents for 3 month F/U (is fasting)  Patient here to follow-up chronic medical problems. He has uncontrolled diabetes mellitus he is noncompliant with his regimen. Also seem he is noncompliant with his dietary measures as well. Please see previous dictation from his lab results when we have tried to call him or the roommate they have often hung up on the office stating they were unhappy with our recommendations because his blood sugars were out of control. His last A1c was at 9.8% eye recommendation was to increase his Tujeo to 45 units at bedtime. He was also noted that he had not been taking his medications regularly will call the pharmacy he had not had amlodipine in quite some time he was erratic filling his metformin and heat also not had his cholesterol medicine in over a year.  OA- He has stopped therapy, states he will  do I at home ,request refill on pain meds also using voltaren and some type of OTC pain spray which helps  DM- did not bring medications or meters,he is injecting Tujeo 42units    Review Of Systems:  GEN- denies fatigue, fever, weight loss,weakness, recent illness HEENT- denies eye drainage, change in vision, nasal discharge, CVS- denies chest pain, palpitations RESP- denies SOB, cough, wheeze ABD- denies N/V, change in stools, abd pain GU- denies dysuria, hematuria, dribbling, incontinence MSK- denies joint pain, muscle aches, injury Neuro- denies headache, dizziness, syncope, seizure activity       Objective:    BP 134/78   Pulse 78   Temp 98.2 F (36.8 C) (Oral)   Resp 14   Ht 5\' 8"  (1.727 m)   Wt 243 lb (110.2 kg)   SpO2 98%   BMI 36.95 kg/m  GEN- NAD, alert and oriented x3 HEENT- PERRL, EOMI, non injected sclera, pink conjunctiva, MMM, oropharynx clear CVS- RRR, no murmur RESP-CTAB ABD-NABS,soft,NT,ND EXT- No edema Pulses- Radial   2+        Assessment & Plan:      Problem List Items Addressed This Visit    Obesity - Primary   OA (osteoarthritis) of knee    Pain medication refilled No sign of overuse/abuse       Relevant Medications   HYDROcodone-acetaminophen (NORCO/VICODIN) 5-325 MG tablet   Hyperlipidemia   Relevant Orders   Lipid panel   Essential hypertension, benign    Fairly controlled, unclear if he is taking meds He has already had HH and THN in past       Diabetes mellitus with neurological manifestation (HCC)    Check A1C if I can get him back below 8% this would be very beneficial to his health Non compliance with diet and meds is issue at hand Discussed also behavior with nurse  No change today      Relevant Orders   CBC with Differential/Platelet   Comprehensive metabolic panel   Hemoglobin A1c   Microalbumin / creatinine urine ratio      Note: This dictation was prepared with Dragon dictation along with smaller phrase technology. Any transcriptional errors that result from this process are unintentional.

## 2017-02-06 NOTE — Assessment & Plan Note (Addendum)
Check A1C if I can get him back below 8% this would be very beneficial to his health Non compliance with diet and meds is issue at hand Discussed also behavior with nurse  No change today

## 2017-02-07 LAB — LIPID PANEL
Cholesterol: 182 mg/dL (ref <200)
HDL: 35 mg/dL — ABNORMAL LOW (ref >40)
LDL Cholesterol: 112 mg/dL — ABNORMAL HIGH (ref <100)
Total CHOL/HDL Ratio: 5.2 Ratio — ABNORMAL HIGH (ref <5.0)
Triglycerides: 176 mg/dL — ABNORMAL HIGH (ref <150)
VLDL: 35 mg/dL — ABNORMAL HIGH (ref <30)

## 2017-02-07 LAB — CBC WITH DIFFERENTIAL/PLATELET
Basophils Absolute: 111 cells/uL (ref 0–200)
Basophils Relative: 1 %
Eosinophils Absolute: 444 cells/uL (ref 15–500)
Eosinophils Relative: 4 %
HCT: 46.7 % (ref 38.5–50.0)
Hemoglobin: 15.6 g/dL (ref 13.0–17.0)
Lymphocytes Relative: 24 %
Lymphs Abs: 2664 cells/uL (ref 850–3900)
MCH: 32.2 pg (ref 27.0–33.0)
MCHC: 33.4 g/dL (ref 32.0–36.0)
MCV: 96.5 fL (ref 80.0–100.0)
MPV: 10.4 fL (ref 7.5–12.5)
Monocytes Absolute: 777 cells/uL (ref 200–950)
Monocytes Relative: 7 %
Neutro Abs: 7104 cells/uL (ref 1500–7800)
Neutrophils Relative %: 64 %
Platelets: 207 10*3/uL (ref 140–400)
RBC: 4.84 MIL/uL (ref 4.20–5.80)
RDW: 12.5 % (ref 11.0–15.0)
WBC: 11.1 10*3/uL — ABNORMAL HIGH (ref 3.8–10.8)

## 2017-02-07 LAB — HEMOGLOBIN A1C
Hgb A1c MFr Bld: 10.8 % — ABNORMAL HIGH (ref <5.7)
Mean Plasma Glucose: 263 mg/dL

## 2017-02-07 LAB — COMPREHENSIVE METABOLIC PANEL
ALT: 30 U/L (ref 9–46)
AST: 23 U/L (ref 10–35)
Albumin: 4 g/dL (ref 3.6–5.1)
Alkaline Phosphatase: 86 U/L (ref 40–115)
BUN: 9 mg/dL (ref 7–25)
CO2: 24 mmol/L (ref 20–31)
Calcium: 9 mg/dL (ref 8.6–10.3)
Chloride: 102 mmol/L (ref 98–110)
Creat: 0.8 mg/dL (ref 0.70–1.33)
Glucose, Bld: 293 mg/dL — ABNORMAL HIGH (ref 70–99)
Potassium: 4.4 mmol/L (ref 3.5–5.3)
Sodium: 135 mmol/L (ref 135–146)
Total Bilirubin: 0.6 mg/dL (ref 0.2–1.2)
Total Protein: 7.2 g/dL (ref 6.1–8.1)

## 2017-02-09 ENCOUNTER — Other Ambulatory Visit: Payer: Self-pay | Admitting: *Deleted

## 2017-02-09 MED ORDER — INSULIN GLARGINE 300 UNIT/ML ~~LOC~~ SOPN: 45.0000 [IU] | PEN_INJECTOR | Freq: Every day | SUBCUTANEOUS | 11 refills | Status: DC

## 2017-02-13 ENCOUNTER — Telehealth (INDEPENDENT_AMBULATORY_CARE_PROVIDER_SITE_OTHER): Payer: Self-pay | Admitting: *Deleted

## 2017-02-13 ENCOUNTER — Encounter (INDEPENDENT_AMBULATORY_CARE_PROVIDER_SITE_OTHER): Payer: Self-pay | Admitting: Internal Medicine

## 2017-02-13 ENCOUNTER — Other Ambulatory Visit (INDEPENDENT_AMBULATORY_CARE_PROVIDER_SITE_OTHER): Payer: Self-pay | Admitting: Internal Medicine

## 2017-02-13 ENCOUNTER — Ambulatory Visit (INDEPENDENT_AMBULATORY_CARE_PROVIDER_SITE_OTHER): Payer: 59 | Admitting: Internal Medicine

## 2017-02-13 VITALS — BP 156/72 | HR 65 | Temp 97.4°F | Ht 68.0 in | Wt 242.9 lb

## 2017-02-13 DIAGNOSIS — R195 Other fecal abnormalities: Secondary | ICD-10-CM

## 2017-02-13 NOTE — Progress Notes (Addendum)
Subjective:    Patient ID: Kevin [name], male    DOB: Nov 28, 1955, 61 y.o.   MRN: [id number]  HPI Brother is present in room.  Referred by Dr Buelah Manis for positive stool card. He denies seeing any blood. BMs are brown. No melena. No family hx of colon cancer. He has never had a colonoscopy in the past. His appetite is good. No weight loss.  BMs x 1-2 a day. Hx of diabetes for years.    CBC    Component Value Date/Time   WBC 11.1 (H) 02/06/2017 1155   RBC 4.84 02/06/2017 1155   HGB 15.6 02/06/2017 1155   HCT 46.7 02/06/2017 1155   PLT 207 02/06/2017 1155   MCV 96.5 02/06/2017 1155   MCH 32.2 02/06/2017 1155   MCHC 33.4 02/06/2017 1155   RDW 12.5 02/06/2017 1155   LYMPHSABS 2,664 02/06/2017 1155   MONOABS 777 02/06/2017 1155   EOSABS 444 02/06/2017 1155   BASOSABS 111 02/06/2017 1155    Review of Systems Past Medical History:  Diagnosis Date  . Arthritis   . Chronic knee pain   . Depression   . Diabetes mellitus ([place])   . Hyperlipidemia   . Hypertension   . Leg edema   . Obesity     Past Surgical History:  Procedure Laterality Date  . none      Allergies  Allergen Reactions  . Lisinopril Cough    Current Outpatient Prescriptions on File Prior to Visit  Medication Sig Dispense Refill  . amLODipine (NORVASC) 5 MG tablet Take 1 tablet (5 mg total) by mouth daily. For blood pressure 90 tablet 1  . atorvastatin (LIPITOR) 20 MG tablet Take 1 tablet (20 mg total) by mouth daily. 90 tablet 3  . Blood Glucose Monitoring Suppl (BLOOD GLUCOSE SYSTEM PAK) KIT Use to monitor FSBS 1x daily. Dx: E11.9 1 each 1  . cetirizine (ZYRTEC) 10 MG tablet Take 1 tablet (10 mg total) by mouth daily. For allergies 90 tablet 3  . clotrimazole-betamethasone (LOTRISONE) cream Apply 1 application topically 2 (two) times daily. 30 g 11  . diclofenac (VOLTAREN) 75 MG EC tablet TAKE (1) TABLET BY MOUTH TWICE DAILY FOR INFLAMMATION. 60 tablet 11  . diclofenac sodium (VOLTAREN) 1 % GEL  APPLY TO THE KNEE THREE TIMES DAILY AS NEEDED. 100 g 0  . FLUoxetine (PROZAC) 20 MG capsule Take 1 capsule (20 mg total) by mouth daily. For your nerves 90 capsule 3  . folic acid (FOLVITE) 1 MG tablet Take 1 tablet (1 mg total) by mouth daily. 90 tablet 3  . furosemide (LASIX) 40 MG tablet Take 1 tablet (40 mg total) by mouth 2 (two) times daily. For fluid 180 tablet 3  . gabapentin (NEURONTIN) 100 MG capsule Take 1-2 capsules at bedtime for arm pain 60 capsule 3  . Glucose Blood (BLOOD GLUCOSE TEST STRIPS) STRP Use to monitor FSBS 3x daily. Dx: E11.65. 100 each 3  . HYDROcodone-acetaminophen (NORCO/VICODIN) 5-325 MG tablet Take 1 tablet by mouth 2 (two) times daily as needed. 60 tablet 0  . Insulin Glargine (TOUJEO SOLOSTAR) 300 UNIT/ML SOPN Inject 45 Units into the skin at bedtime. 4 pen 11  . Insulin Pen Needle 29G X 12.7MM MISC Use as directed 100 each 11  . Lancet Devices MISC Use to monitor FSBS 1x daily. Dx: E11.9 1 each 1  . Lancets MISC Use to monitor FSBS 1x daily. Dx: E11.9 100 each 1  . metFORMIN (GLUCOPHAGE) 1000 MG tablet  Take 1 tablet (1,000 mg total) by mouth 2 (two) times daily with a meal. For diabetes 180 tablet 3  . nitroGLYCERIN (NITROSTAT) 0.4 MG SL tablet Place 1 tablet under the tongue every 5 minutes as needed for chest pain x3. If not resolved after 3, go to ER. 30 tablet 6  . potassium chloride (K-DUR) 10 MEQ tablet Take 1 tablet (10 mEq total) by mouth 2 (two) times daily. Take with fluid pill 180 tablet 3  . sitaGLIPtin (JANUVIA) 100 MG tablet Take 1 tablet (100 mg total) by mouth daily. 90 tablet 3   No current facility-administered medications on file prior to visit.        Objective:   Physical Exam Blood pressure (!) 156/72, pulse 65, temperature 97.4 F (36.3 C), height '5\' 8"'  (1.727 m), weight 242 lb 14.4 oz (110.2 kg). Alert and oriented. Skin warm and dry. Oral mucosa is moist.   . Sclera anicteric, conjunctivae is pink. Thyroid not enlarged. No cervical  lymphadenopathy. Lungs clear. Heart regular rate and rhythm.  Abdomen is soft. Bowel sounds are positive. No hepatomegaly. No abdominal masses felt. No tenderness.  No edema to lower extremities.         Assessment & Plan:  Guaiac + stool. Needs diagnoistic colonoscopy to rule out colon cancer, AMV, polyp, hemorrhoids.  Patient refuses liquid prep. Unable to give him pills due to diabetes.

## 2017-02-13 NOTE — Patient Instructions (Signed)
Colonoscopy.  The risks and benefits such as perforation, bleeding, and infection were reviewed with the patient and is agreeable. 

## 2017-02-13 NOTE — Telephone Encounter (Signed)
I tried to schedule patient for TCS, he was adamant that he could not drink solution. I tried to explain that he couldn't do Osmo pill prep due to him being diabetic. Patient stated if he had to drink solution he wasn't having TCS and left office

## 2017-02-20 NOTE — Telephone Encounter (Signed)
Noted. I would let Dr Laural Golden know

## 2017-02-20 NOTE — Telephone Encounter (Signed)
Forwarded to Dr.Rehman. 

## 2017-02-23 NOTE — Telephone Encounter (Signed)
What prep he wants to take

## 2017-02-23 NOTE — Telephone Encounter (Signed)
Patient wants to do osmo pill prep, but it's my understanding if you are diabetic you cannot do it

## 2017-03-07 NOTE — Telephone Encounter (Signed)
Can be prepped with Osmo as long as he understands the potential risks. Will check serum creatinine on the day of procedure.

## 2017-03-09 ENCOUNTER — Encounter (INDEPENDENT_AMBULATORY_CARE_PROVIDER_SITE_OTHER): Payer: Self-pay | Admitting: *Deleted

## 2017-03-09 ENCOUNTER — Other Ambulatory Visit (INDEPENDENT_AMBULATORY_CARE_PROVIDER_SITE_OTHER): Payer: Self-pay | Admitting: Internal Medicine

## 2017-03-09 ENCOUNTER — Telehealth (INDEPENDENT_AMBULATORY_CARE_PROVIDER_SITE_OTHER): Payer: Self-pay | Admitting: *Deleted

## 2017-03-09 DIAGNOSIS — R195 Other fecal abnormalities: Secondary | ICD-10-CM | POA: Insufficient documentation

## 2017-03-09 MED ORDER — SOD PHOS MONO-SOD PHOS DIBASIC 1.102-0.398 G PO TABS: 1.0000 | ORAL_TABLET | Freq: Once | ORAL | 0 refills | Status: AC

## 2017-03-09 NOTE — Telephone Encounter (Signed)
Patient needs osmo pill prep 

## 2017-03-09 NOTE — Telephone Encounter (Signed)
TCS sch'd 03/27/17, patient aware, instructions maile

## 2017-03-09 NOTE — Telephone Encounter (Signed)
I have told him that the pills can cause kidney damage. He is aware. He would like to proceed with colonoscopy.

## 2017-03-09 NOTE — Telephone Encounter (Signed)
Kevin [name] will you call patient and discuss risks of taking osmo pill being diabetic. I don't feel comfortable doing this since I'm not clinical -- if he decides to proceed let me know. thanks

## 2017-03-13 ENCOUNTER — Telehealth (INDEPENDENT_AMBULATORY_CARE_PROVIDER_SITE_OTHER): Payer: Self-pay | Admitting: *Deleted

## 2017-03-13 ENCOUNTER — Ambulatory Visit (INDEPENDENT_AMBULATORY_CARE_PROVIDER_SITE_OTHER): Payer: 59 | Admitting: Family Medicine

## 2017-03-13 ENCOUNTER — Encounter: Payer: Self-pay | Admitting: Family Medicine

## 2017-03-13 VITALS — BP 118/66 | HR 82 | Temp 97.9°F | Resp 18 | Ht 68.0 in | Wt 246.0 lb

## 2017-03-13 DIAGNOSIS — B353 Tinea pedis: Secondary | ICD-10-CM | POA: Diagnosis not present

## 2017-03-13 DIAGNOSIS — E0840 Diabetes mellitus due to underlying condition with diabetic neuropathy, unspecified: Secondary | ICD-10-CM | POA: Diagnosis not present

## 2017-03-13 DIAGNOSIS — Z794 Long term (current) use of insulin: Secondary | ICD-10-CM

## 2017-03-13 LAB — GLUCOSE, FINGERSTICK (STAT): Glucose, fingerstick: 423 mg/dL — ABNORMAL HIGH (ref 65–99)

## 2017-03-13 MED ORDER — METFORMIN HCL 1000 MG PO TABS: 1000.0000 mg | ORAL_TABLET | Freq: Two times a day (BID) | ORAL | 3 refills | Status: DC

## 2017-03-13 MED ORDER — CLOTRIMAZOLE 1 % EX CREA: 1.0000 "application " | TOPICAL_CREAM | Freq: Two times a day (BID) | CUTANEOUS | 1 refills | Status: DC

## 2017-03-13 MED ORDER — SITAGLIPTIN PHOSPHATE 100 MG PO TABS: 100.0000 mg | ORAL_TABLET | Freq: Every day | ORAL | 3 refills | Status: DC

## 2017-03-13 MED ORDER — INSULIN GLARGINE 300 UNIT/ML ~~LOC~~ SOPN: 50.0000 [IU] | PEN_INJECTOR | Freq: Every day | SUBCUTANEOUS | 11 refills | Status: DC

## 2017-03-13 NOTE — Telephone Encounter (Signed)
noted 

## 2017-03-13 NOTE — Progress Notes (Signed)
   Subjective:    Patient ID: Kevin [name], male    DOB: May 12, 1956, 61 y.o.   MRN: [id number]  Patient presents for Medication Management and Medication Refill  Here for intermin visit to review his diabetes Dm-  CBGS- 110 Per report, no low sugars, Lantus 45units, metormin and januvia    Last night- Cheerios and Banana   This morning- banana sandwhich, mayo   CBG 423 IN OFFICE  Broken foot - taking norco at bedtime as needed and some type of medication from Dr. Lorin Mercy for his broken foot Has sore spot on right foot, between 4 and 5th digits   Review Of Systems:  GEN- denies fatigue, fever, weight loss,weakness, recent illness HEENT- denies eye drainage, change in vision, nasal discharge, CVS- denies chest pain, palpitations RESP- denies SOB, cough, wheeze ABD- denies N/V, change in stools, abd pain GU- denies dysuria, hematuria, dribbling, incontinence MSK- denies joint pain, muscle aches, injury Neuro- denies headache, dizziness, syncope, seizure activity       Objective:    BP 118/66   Pulse 82   Temp 97.9 F (36.6 C) (Oral)   Resp 18   Ht 5\' 8"  (1.727 m)   Wt 246 lb (111.6 kg)   BMI 37.40 kg/m  GEN- NAD, alert and oriented x3 CVS- RRR, no murmur RESP-CTAB Ext- walking shoe right foot, tinea pedis with maceration between toes, thick nails        Assessment & Plan:      Problem List Items Addressed This Visit    Diabetes mellitus with neurological manifestation ([place]) - Primary    He is adament he is injecting his insulin and taking meds  I have no meter to prove he is checking sugars Random CBG in office 423 This will tell if he is being compliant with his medications or not our note that his diet is very poor. Since he is adamant that he is injecting the correct amount of insulin and then increased to 50 units of the Lantus a lot to try to avoid milk time insulin as I think that she is getting worsening compliance. We did discuss with the pharmacy he does  have all of his oral medications at this time. Follow with him in 3 days of loose short-term phone call follow-up will show that he is being compliant in checking his blood sugars.      Relevant Medications   metFORMIN (GLUCOPHAGE) 1000 MG tablet   sitaGLIPtin (JANUVIA) 100 MG tablet   Insulin Glargine (TOUJEO SOLOSTAR) 300 UNIT/ML SOPN   Other Relevant Orders   Glucose, fingerstick (stat) (Completed)    Other Visit Diagnoses    Tinea pedis of both feet       Relevant Medications   clotrimazole (LOTRIMIN) 1 % cream      Note: This dictation was prepared with Dragon dictation along with smaller phrase technology. Any transcriptional errors that result from this process are unintentional.

## 2017-03-13 NOTE — Patient Instructions (Addendum)
F/U 2 months  Incresae insulin to 50units at bedtime

## 2017-03-13 NOTE — Telephone Encounter (Signed)
Sent Rx for Osmo pill prep to [place] -- rec'd fax from them stating patient's insurance does not cover pill prep, requested new RX for liquid prep -- I called patient and advised him of this and stated to just cancel TCS since he cannot drink liquid -- TCS canceled

## 2017-03-14 ENCOUNTER — Encounter: Payer: Self-pay | Admitting: Family Medicine

## 2017-03-14 NOTE — Assessment & Plan Note (Signed)
He is adament he is injecting his insulin and taking meds  I have no meter to prove he is checking sugars Random CBG in office 423 This will tell if he is being compliant with his medications or not our note that his diet is very poor. Since he is adamant that he is injecting the correct amount of insulin and then increased to 50 units of the Lantus a lot to try to avoid milk time insulin as I think that she is getting worsening compliance. We did discuss with the pharmacy he does have all of his oral medications at this time. Follow with him in 3 days of loose short-term phone call follow-up will show that he is being compliant in checking his blood sugars.

## 2017-03-16 ENCOUNTER — Telehealth (INDEPENDENT_AMBULATORY_CARE_PROVIDER_SITE_OTHER): Payer: Self-pay | Admitting: *Deleted

## 2017-03-16 ENCOUNTER — Telehealth: Payer: Self-pay | Admitting: *Deleted

## 2017-03-16 NOTE — Telephone Encounter (Signed)
Keep checking blood sugars at least twice a day  Call us in 1 week with readings  Keep taking the medications

## 2017-03-16 NOTE — Telephone Encounter (Signed)
Call placed to patient to inquire as to FSBS readings.   Given (3) readings, but unclear as to when readings were obtained. Readings are as follows: 78 107 106  States that he is taking Toujeo 50u, and Metformin 1000mg  BID.   MD to be made aware.

## 2017-03-16 NOTE — Telephone Encounter (Signed)
FYI: colonoscopy -- Just wanted to let you know where we are with Kevin [name]. I spoke to Dr Laural Golden in regards to prep for colonoscopy and he was ok with patient doing pills as long as patient knew risks since he is diabetic. I spoke to Kevin Fraley and he was ok with pill prep and wanted to proceed and we scheduled him for 03/27/17 but he didn't know if he would have a ride. We did send a Rx to [place] for [place] but received a fax from them stating his insurance didn't cover pill prep and wanted to change to liquid prep as that is what was covered. I spoke to Kevin Lada and advised him of this and stated to cancel because he couldn't drink liquid prep. Colonoscopy was canceled per patient.

## 2017-03-16 NOTE — Telephone Encounter (Signed)
noted 

## 2017-03-16 NOTE — Telephone Encounter (Signed)
Please let Dr. Buelah Manis no wants going on.

## 2017-03-17 NOTE — Telephone Encounter (Signed)
Call placed to patient and patient made aware.  

## 2017-03-27 ENCOUNTER — Encounter (HOSPITAL_COMMUNITY): Payer: Self-pay

## 2017-03-27 ENCOUNTER — Ambulatory Visit (HOSPITAL_COMMUNITY): Admit: 2017-03-27 | Payer: Medicaid Other | Admitting: Internal Medicine

## 2017-03-27 SURGERY — COLONOSCOPY
Anesthesia: Moderate Sedation

## 2017-05-15 ENCOUNTER — Encounter: Payer: Self-pay | Admitting: Family Medicine

## 2017-05-15 ENCOUNTER — Ambulatory Visit (INDEPENDENT_AMBULATORY_CARE_PROVIDER_SITE_OTHER): Payer: 59 | Admitting: Family Medicine

## 2017-05-15 VITALS — BP 138/70 | HR 88 | Temp 98.1°F | Resp 14 | Ht 68.0 in | Wt 245.0 lb

## 2017-05-15 DIAGNOSIS — M5030 Other cervical disc degeneration, unspecified cervical region: Secondary | ICD-10-CM | POA: Diagnosis not present

## 2017-05-15 DIAGNOSIS — I10 Essential (primary) hypertension: Secondary | ICD-10-CM

## 2017-05-15 DIAGNOSIS — E0840 Diabetes mellitus due to underlying condition with diabetic neuropathy, unspecified: Secondary | ICD-10-CM

## 2017-05-15 DIAGNOSIS — Z794 Long term (current) use of insulin: Secondary | ICD-10-CM

## 2017-05-15 LAB — GLUCOSE, FINGERSTICK (STAT): Glucose, fingerstick: 207 mg/dL — ABNORMAL HIGH (ref 65–99)

## 2017-05-15 MED ORDER — HYDROCODONE-ACETAMINOPHEN 5-325 MG PO TABS: 1.0000 | ORAL_TABLET | Freq: Two times a day (BID) | ORAL | 0 refills | Status: DC | PRN

## 2017-05-15 NOTE — Assessment & Plan Note (Addendum)
appt scheduled with Dr. Lorin Mercy Hydrocodone refilled. Discussed importance of following up with his appointments as scheduled. He is to continue the gabapentin as well.

## 2017-05-15 NOTE — Assessment & Plan Note (Addendum)
Uncontrolled. Discussed again the importance of following proper nutrition and taken the medication on a regular basis. His blood sugar today in the office was improved from his previous it was at 207. He is to continue with Tujeo 50 units he states that he has his medications. We'll call the pharmacy multiple times and asked for delivery of his meds but advised that he has to call for refills when they are needed. I did not make any changes to his regimen today. We spent significant time coordinating all of his appointments that he would have been before he left the office today.  This included orthopedics as well as ophthalmology

## 2017-05-15 NOTE — Assessment & Plan Note (Signed)
Blood pressure looks okay today no change in medication 

## 2017-05-15 NOTE — Progress Notes (Signed)
   Subjective:    Patient ID: Kevin [name], male    DOB: 21-Oct-1955, 61 y.o.   MRN: [id number]  Patient presents for Follow-up (is not fasting) Patient here for interim follow-up on his diabetes. His most recent A1c was 10.3%. Previous 9.8% . His Tujeo was increased to 50 units  He was also advised to take Metformin and Januvia regulary He did not bring his meter  Hyperlpidemia- LDL was 112, instructed to take his lipitor at bedtime which he was not filling regularly   He also declined getting a colonoscopy, as he did not want to drink the prep and the OSMO pill was not covered by his insurance   Continues to have neck pain has known cervical disc disease he was seen by Dr. Lorin Mercy to earlier this year but there is some type of miscommunication he has missed his follow-up states that his arm is hurting more and it throbs at times. He was asking for me to order a brace for his arm. He is taking the gabapentin requested a refill on his hydrocodone.  He also wanted to clarify his appointment for the eye doctor.  Review Of Systems:  GEN- denies fatigue, fever, weight loss,weakness, recent illness HEENT- denies eye drainage, change in vision, nasal discharge, CVS- denies chest pain, palpitations RESP- denies SOB, cough, wheeze ABD- denies N/V, change in stools, abd pain GU- denies dysuria, hematuria, dribbling, incontinence MSK- +joint pain, muscle aches, injury Neuro- denies headache, dizziness, syncope, seizure activity       Objective:    BP 138/70   Pulse 88   Temp 98.1 F (36.7 C) (Oral)   Resp 14   Ht 5\' 8"  (1.727 m)   Wt 245 lb (111.1 kg)   SpO2 98%   BMI 37.25 kg/m  GEN- NAD, alert and oriented x3 Neck- Supple, fair ROM CVS- RRR, no murmur RESP-CTAB EXT- No edema Pulses- Radial  2+        Assessment & Plan:      Problem List Items Addressed This Visit    Essential hypertension, benign    Blood pressure looks okay today no change in medication.      Diabetes mellitus with neurological manifestation ([place]) - Primary    Uncontrolled. Discussed again the importance of following proper nutrition and taken the medication on a regular basis. His blood sugar today in the office was improved from his previous it was at 207. He is to continue with Tujeo 50 units he states that he has his medications. We'll call the pharmacy multiple times and asked for delivery of his meds but advised that he has to call for refills when they are needed. I did not make any changes to his regimen today. We spent significant time coordinating all of his appointments that he would have been before he left the office today.      Relevant Orders   Glucose, fingerstick (stat) (Completed)   DDD (degenerative disc disease), cervical    appt scheduled with Dr. Lorin Mercy Hydrocodone refilled. Discussed importance of following up with his appointments as scheduled. He is to continue the gabapentin as well.      Relevant Medications   HYDROcodone-acetaminophen (NORCO/VICODIN) 5-325 MG tablet      Note: This dictation was prepared with Dragon dictation along with smaller phrase technology. Any transcriptional errors that result from this process are unintentional.

## 2017-05-15 NOTE — Patient Instructions (Addendum)
Dr. Lorin Mercy- go back about your neck/arm  - Appt Next Thursday 8/16 at [facility] in [place] [facility] Linna Hoff 8/21 at [place]- 50 units of insulin  Check your blood sugar every day and write it down F/U  2 MONTHS

## 2017-05-25 ENCOUNTER — Ambulatory Visit (INDEPENDENT_AMBULATORY_CARE_PROVIDER_SITE_OTHER): Payer: 59 | Admitting: Orthopaedic Surgery

## 2017-06-01 ENCOUNTER — Ambulatory Visit (INDEPENDENT_AMBULATORY_CARE_PROVIDER_SITE_OTHER): Payer: 59 | Admitting: Orthopaedic Surgery

## 2017-06-01 ENCOUNTER — Encounter (INDEPENDENT_AMBULATORY_CARE_PROVIDER_SITE_OTHER): Payer: Self-pay | Admitting: Orthopaedic Surgery

## 2017-06-01 VITALS — BP 98/64 | HR 89

## 2017-06-01 DIAGNOSIS — M5020 Other cervical disc displacement, unspecified cervical region: Secondary | ICD-10-CM | POA: Insufficient documentation

## 2017-06-01 DIAGNOSIS — M7542 Impingement syndrome of left shoulder: Secondary | ICD-10-CM

## 2017-06-01 MED ORDER — METHYLPREDNISOLONE ACETATE 40 MG/ML IJ SUSP
40.0000 mg | INTRAMUSCULAR | Status: AC | PRN
  Administered 2017-06-01: 40 mg via INTRA_ARTICULAR

## 2017-06-01 MED ORDER — LIDOCAINE HCL 1 % IJ SOLN
1.0000 mL | INTRAMUSCULAR | Status: AC | PRN
  Administered 2017-06-01: 1 mL

## 2017-06-01 MED ORDER — BUPIVACAINE HCL 0.25 % IJ SOLN
4.0000 mL | INTRAMUSCULAR | Status: AC | PRN
  Administered 2017-06-01: 4 mL via INTRA_ARTICULAR

## 2017-06-01 NOTE — Progress Notes (Signed)
Office Visit Note   Patient: Kevin [name]           Date of Birth: 1956-05-06           MRN: [id number] Visit Date: 06/01/2017              Requested by: Alycia Rossetti, MD [address] [place], [place] [address] PCP: Alycia Rossetti, MD   Assessment & Plan: Visit Diagnoses:  1. Impingement syndrome of left shoulder   2. Protrusion of cervical intervertebral disc     Plan: Subacromial injection performed. He'll watch his sugars closely and increases insulin 5 units daily units daily if needed until it returns back to normal and about 5-7 days. She tolerated the injection well and having removed range of motion of the shoulder and less pain.  Follow-Up Instructions: Return in about 3 weeks (around 06/22/2017).   Orders:  Orders Placed This Encounter  Procedures  . Large Joint Injection/Arthrocentesis   No orders of the defined types were placed in this encounter.     Procedures: Large Joint Inj Date/Time: 06/01/2017 1:33 PM Performed by: Marybelle Killings Authorized by: Rodell Perna C   Consent Given by:  Patient Indications:  Pain Location:  Shoulder Site:  L subacromial bursa Needle Size:  22 G Needle Length:  1.5 inches Ultrasound Guidance: No   Fluoroscopic Guidance: No   Arthrogram: No   Medications:  4 mL bupivacaine 0.25 %; 1 mL lidocaine 1 %; 40 mg methylPREDNISolone acetate 40 MG/ML Aspiration Attempted: No   Patient tolerance:  Patient tolerated the procedure well with no immediate complications      Clinical Data: No additional findings.   Subjective: Chief Complaint  Patient presents with  . Left Shoulder - Pain  . Right Knee - Pain    HPI 61 year old male returns since I last saw me out of through both follow-up the counter his toe and he has a toe fracture and is wearing the a postop sandal. He's had increased pain in his neck and pain in his left shoulder. He had previous injection in his knee and his knee is a little bit better.  He's having numbness in his left hand involving all fingers. He noted that he had some tremor and shaking in it. He has difficulty getting dressed problems getting his arm up over his head on the left secondary to pain. He denies fever chills. No gait disturbance other than problems with this broken toe.  Review of Systems 14 point review of systems is updated and is unchanged from 6 months ago visit in February of the than as mentioned in history of present illness. He's had increase in his neck pain and shoulder pain.  Objective: Vital Signs: BP 98/64   Pulse 89   Physical Exam  Constitutional: He is oriented to person, place, and time. He appears well-developed and well-nourished.  HENT:  Head: Normocephalic and atraumatic.  Eyes: Pupils are equal, round, and reactive to light. EOM are normal.  Neck: No tracheal deviation present. No thyromegaly present.  Cardiovascular: Normal rate.   Pulmonary/Chest: Effort normal. He has no wheezes.  Abdominal: Soft. Bowel sounds are normal.  Musculoskeletal:  Patient is some swelling of the right fifth toe with this fracture. He is in a postop shoe. He has some crepitus with knee range of motion. No pitting edema is noted. No rash over spinous scan. Upper semi-reflexes the biceps triceps brachioradialis are 1+ and symmetrical. Positive impingement  left shoulder. He has significant brachial plexus tenderness on the left mild on the right side. Positive Spurling negative for meat. Pain with rotation limited to about 50% rotation to the left. No pain with right rotation. Positive impingement left shoulder long head of the biceps mildly tender pain with resisted supraspinatus testing.  Neurological: He is alert and oriented to person, place, and time.  Skin: Skin is warm and dry. Capillary refill takes less than 2 seconds.  Psychiatric: He has a normal mood and affect. His behavior is normal. Judgment and thought content normal.    Ortho Exam  Specialty  Comments:  No specialty comments available.  Imaging: No results found.   PMFS History: Patient Active Problem List   Diagnosis Date Noted  . Impingement syndrome of left shoulder 06/01/2017  . Protrusion of cervical intervertebral disc 06/01/2017  . Guaiac positive stools 03/09/2017  . Neck pain 12/01/2016  . DDD (degenerative disc disease), cervical 09/07/2015  . Musculoskeletal pain 05/29/2014  . Black eye, traumatic 02/17/2014  . Seasonal allergies 02/17/2014  . Onychomycosis 11/19/2013  . Dental abscess 09/27/2013  . Illiteracy and low-level literacy 05/27/2013  . Unspecified sleep apnea 05/15/2013  . Tremor 08/07/2012  . Elevated LFTs 08/07/2012  . Anxiety 06/27/2012  . Colon cancer screening 04/19/2012  . Macrocytosis without anemia 04/19/2012  . Esophageal dysphagia 03/27/2012  . OA (osteoarthritis) of knee 01/18/2012  . Diabetes mellitus with neurological manifestation ([place]) 12/23/2011  . Neuropathy, diabetic ([place]) 12/23/2011  . Leg pain 12/23/2011  . Obesity 12/11/2011  . Right knee pain 12/11/2011  . Leg edema 12/11/2011  . Essential hypertension, benign 12/08/2011  . Hyperlipidemia 12/08/2011   Past Medical History:  Diagnosis Date  . Arthritis   . Chronic knee pain   . Depression   . Diabetes mellitus ([place])   . Hyperlipidemia   . Hypertension   . Leg edema   . Obesity     Family History  Problem Relation Age of Onset  . Heart disease Unknown   . Arthritis Unknown   . Diabetes Unknown   . Cancer Mother        deceased    Past Surgical History:  Procedure Laterality Date  . none     Social History   Occupational History  . Not on file.   Social History Main Topics  . Smoking status: Current Every Day Smoker    Packs/day: 0.20  . Smokeless tobacco: Never Used  . Alcohol use No  . Drug use: No  . Sexual activity: Not Currently

## 2017-06-13 ENCOUNTER — Other Ambulatory Visit: Payer: Self-pay | Admitting: Family Medicine

## 2017-06-20 ENCOUNTER — Other Ambulatory Visit: Payer: Self-pay | Admitting: Family Medicine

## 2017-06-21 ENCOUNTER — Other Ambulatory Visit: Payer: Self-pay | Admitting: *Deleted

## 2017-06-21 MED ORDER — NITROGLYCERIN 0.4 MG SL SUBL: SUBLINGUAL_TABLET | SUBLINGUAL | 0 refills | Status: DC

## 2017-06-21 NOTE — Telephone Encounter (Signed)
Received VM from patient.   Reports that he require refill on Nitrostat for chest pain.   Call placed to patient to inquire.   Patient reports that he is having slight spasm like pain to center of chest. Denies any radiation of pain. Denies HA, jaw pain, SOB, tachycardia, arm pain.  Prescription sent to pharmacy per patient request.

## 2017-06-22 ENCOUNTER — Ambulatory Visit (INDEPENDENT_AMBULATORY_CARE_PROVIDER_SITE_OTHER): Payer: 59 | Admitting: Orthopaedic Surgery

## 2017-06-29 ENCOUNTER — Telehealth: Payer: Self-pay | Admitting: *Deleted

## 2017-06-29 ENCOUNTER — Ambulatory Visit (INDEPENDENT_AMBULATORY_CARE_PROVIDER_SITE_OTHER): Payer: 59 | Admitting: Orthopaedic Surgery

## 2017-06-29 NOTE — Telephone Encounter (Signed)
Received call from patient in regards to bill.   Please contact to discuss.

## 2017-07-06 ENCOUNTER — Encounter (INDEPENDENT_AMBULATORY_CARE_PROVIDER_SITE_OTHER): Payer: Self-pay | Admitting: Orthopaedic Surgery

## 2017-07-06 ENCOUNTER — Ambulatory Visit (INDEPENDENT_AMBULATORY_CARE_PROVIDER_SITE_OTHER): Payer: 59 | Admitting: Orthopaedic Surgery

## 2017-07-06 VITALS — BP 132/80 | HR 81

## 2017-07-06 DIAGNOSIS — M5020 Other cervical disc displacement, unspecified cervical region: Secondary | ICD-10-CM

## 2017-07-06 NOTE — Progress Notes (Signed)
Office Visit Note   Patient: Kevin [name]           Date of Birth: September 07, 1956           MRN: [id number] Visit Date: 07/06/2017              Requested by: Alycia Rossetti, MD [address] [place], [place] [address] PCP: Alycia Rossetti, MD   Assessment & Plan: Visit Diagnoses:  1. Protrusion of cervical intervertebral disc     Plan: We discussed the pathology present and reviewed the images of his cervical MRI. We'll check him back again in 3 months he is having persistent symptoms left consider sedation of finishing the imaging for cervical MRI scan if if symptoms progress and he decides was to proceed with the surgical treatment. Currently on recommend continued activity modification careful diabetic management walking program and gradually increasing some exercises of his hand.  Follow-Up Instructions: Return in about 3 months (around 10/05/2017).   Orders:  No orders of the defined types were placed in this encounter.  No orders of the defined types were placed in this encounter.     Procedures: No procedures performed   Clinical Data: No additional findings.   Subjective: Chief Complaint  Patient presents with  . Neck - Follow-up  . Left Shoulder - Follow-up    HPI Patient returns he's been having problems with some left hand shaking and an injection cortisone 06/01/2017 he states it really didn't make his sugars go up. He has some problems squeezing his fingers and had a partial MRI of the cervical spine that had to be stopped due to problems but the sagittal images did show disc protrusion central and left at C6-7 without cord changes. No changes medications he is on insulin is diabetic Review of Systems 14 point review of systems updated unchanged from previous office visit. Of note is the obesity insulin dependent diabetes neuropathy. Cervical disc protrusion at C6-7 left paracentral.   Objective: Vital Signs: BP 132/80   Pulse 81   Physical  Exam  Constitutional: He is oriented to person, place, and time. He appears well-developed and well-nourished.  HENT:  Head: Normocephalic and atraumatic.  Eyes: Pupils are equal, round, and reactive to light. EOM are normal.  Neck: No tracheal deviation present. No thyromegaly present.  Cardiovascular: Normal rate.   Pulmonary/Chest: Effort normal. He has no wheezes.  Abdominal: Soft. Bowel sounds are normal.  Neurological: He is alert and oriented to person, place, and time.  Skin: Skin is warm and dry. Capillary refill takes less than 2 seconds.  Psychiatric: He has a normal mood and affect. His behavior is normal. Judgment and thought content normal.    Ortho Exam negative impingement left shoulder. He has significant brachial plexus tenderness on the left. He has mild intention hand tremor. Fingertip flexion 1 cm fingertips to palm. No triceps weakness. Biceps triceps radialis are 1+ and symmetrical. No edema in the left upper extremities. Veins in the form stripped normally. No engorgement. No thenar or hyperthenar atrophy.  Specialty Comments:  No specialty comments available.  Imaging: No results found.   PMFS History: Patient Active Problem List   Diagnosis Date Noted  . Impingement syndrome of left shoulder 06/01/2017  . Protrusion of cervical intervertebral disc 06/01/2017  . Guaiac positive stools 03/09/2017  . Neck pain 12/01/2016  . DDD (degenerative disc disease), cervical 09/07/2015  . Musculoskeletal pain 05/29/2014  . Black eye, traumatic 02/17/2014  .  Seasonal allergies 02/17/2014  . Onychomycosis 11/19/2013  . Dental abscess 09/27/2013  . Illiteracy and low-level literacy 05/27/2013  . Unspecified sleep apnea 05/15/2013  . Tremor 08/07/2012  . Elevated LFTs 08/07/2012  . Anxiety 06/27/2012  . Colon cancer screening 04/19/2012  . Macrocytosis without anemia 04/19/2012  . Esophageal dysphagia 03/27/2012  . OA (osteoarthritis) of knee 01/18/2012  .  Diabetes mellitus with neurological manifestation ([place]) 12/23/2011  . Neuropathy, diabetic ([place]) 12/23/2011  . Leg pain 12/23/2011  . Obesity 12/11/2011  . Right knee pain 12/11/2011  . Leg edema 12/11/2011  . Essential hypertension, benign 12/08/2011  . Hyperlipidemia 12/08/2011   Past Medical History:  Diagnosis Date  . Arthritis   . Chronic knee pain   . Depression   . Diabetes mellitus ([place])   . Hyperlipidemia   . Hypertension   . Leg edema   . Obesity     Family History  Problem Relation Age of Onset  . Heart disease Unknown   . Arthritis Unknown   . Diabetes Unknown   . Cancer Mother        deceased    Past Surgical History:  Procedure Laterality Date  . none     Social History   Occupational History  . Not on file.   Social History Main Topics  . Smoking status: Current Every Day Smoker    Packs/day: 0.20  . Smokeless tobacco: Never Used  . Alcohol use No  . Drug use: No  . Sexual activity: Not Currently

## 2017-07-17 ENCOUNTER — Ambulatory Visit (INDEPENDENT_AMBULATORY_CARE_PROVIDER_SITE_OTHER): Payer: 59 | Admitting: Family Medicine

## 2017-07-17 ENCOUNTER — Encounter: Payer: Self-pay | Admitting: Family Medicine

## 2017-07-17 VITALS — BP 128/72 | HR 78 | Temp 98.0°F | Resp 16 | Ht 68.0 in | Wt 246.0 lb

## 2017-07-17 DIAGNOSIS — E0840 Diabetes mellitus due to underlying condition with diabetic neuropathy, unspecified: Secondary | ICD-10-CM | POA: Diagnosis not present

## 2017-07-17 DIAGNOSIS — Z23 Encounter for immunization: Secondary | ICD-10-CM | POA: Diagnosis not present

## 2017-07-17 DIAGNOSIS — Z794 Long term (current) use of insulin: Secondary | ICD-10-CM

## 2017-07-17 DIAGNOSIS — E782 Mixed hyperlipidemia: Secondary | ICD-10-CM

## 2017-07-17 DIAGNOSIS — Z6837 Body mass index (BMI) 37.0-37.9, adult: Secondary | ICD-10-CM

## 2017-07-17 LAB — COMPREHENSIVE METABOLIC PANEL
AG Ratio: 1.3 (calc) (ref 1.0–2.5)
ALT: 33 U/L (ref 9–46)
AST: 34 U/L (ref 10–35)
Albumin: 4 g/dL (ref 3.6–5.1)
Alkaline phosphatase (APISO): 73 U/L (ref 40–115)
BUN: 12 mg/dL (ref 7–25)
CO2: 24 mmol/L (ref 20–32)
Calcium: 9 mg/dL (ref 8.6–10.3)
Chloride: 102 mmol/L (ref 98–110)
Creat: 0.76 mg/dL (ref 0.70–1.33)
Globulin: 3.1 g/dL (calc) (ref 1.9–3.7)
Glucose, Bld: 204 mg/dL — ABNORMAL HIGH (ref 65–99)
Potassium: 4.4 mmol/L (ref 3.5–5.3)
Sodium: 136 mmol/L (ref 135–146)
Total Bilirubin: 0.6 mg/dL (ref 0.2–1.2)
Total Protein: 7.1 g/dL (ref 6.1–8.1)

## 2017-07-17 LAB — LIPID PANEL
Cholesterol: 198 mg/dL (ref <200)
HDL: 37 mg/dL — ABNORMAL LOW (ref >40)
LDL Cholesterol (Calc): 132 mg/dL (calc) — ABNORMAL HIGH
Non-HDL Cholesterol (Calc): 161 mg/dL (calc) — ABNORMAL HIGH (ref <130)
Total CHOL/HDL Ratio: 5.4 (calc) — ABNORMAL HIGH (ref <5.0)
Triglycerides: 155 mg/dL — ABNORMAL HIGH (ref <150)

## 2017-07-17 LAB — HEMOGLOBIN A1C, FINGERSTICK: Hgb A1C (fingerstick): 10.6 %{Hb} — ABNORMAL HIGH

## 2017-07-17 NOTE — Progress Notes (Signed)
   Subjective:    Patient ID: Kevin [name], male    DOB: 1956/05/21, 61 y.o.   MRN: [id number]  Patient presents for Medication Management and Flu Vaccine Patient to follow-up diabetes His last A1c was 10.3%  He is currently on Tujeo 50 units , he did not bring his meter ( as typical), states CBG 170-200's, no low sugars  And Januvia   HTN- taking blood pressure medication as prescribed     Flu shot due   Review Of Systems:  GEN- denies fatigue, fever, weight loss,weakness, recent illness HEENT- denies eye drainage, change in vision, nasal discharge, CVS- denies chest pain, palpitations RESP- denies SOB, cough, wheeze ABD- denies N/V, change in stools, abd pain GU- denies dysuria, hematuria, dribbling, incontinence MSK- denies joint pain, muscle aches, injury Neuro- denies headache, dizziness, syncope, seizure activity       Objective:    BP 128/72   Pulse 78   Temp 98 F (36.7 C) (Oral)   Resp 16   Ht 5\' 8"  (1.727 m)   Wt 246 lb (111.6 kg)   BMI 37.40 kg/m  GEN- NAD, alert and oriented x3 CVS- RRR, no murmur RESP-CTAB EXT- No edema Pulses- Radial,2+  A1C 10.6%      Assessment & Plan:      Problem List Items Addressed This Visit      Unprioritized   Obesity   Hyperlipidemia   Relevant Orders   Lipid panel   Diabetes mellitus with neurological manifestation ([place]) - Primary    His diabetes is uncontrolled this been no movement in his A1c for about a year now. He does not bring his meter in with her difficulties with compliance in the past that he states that he is injecting his insulin. I'll be more concerned about trying to give him multiple doses of insulin throughout the day as I do not think that he is taking the nighttime dose regularly. He is on metformin as well as Januvia. Medicine him to an endocrinologist to see if maybe just focusing on diabetes at one appointment may help with some consistency. I have already tried healthcare network as well  as other nurses within the home and this has not worked out.      Relevant Orders   Comprehensive metabolic panel   Lipid panel   Hemoglobin A1C, fingerstick (Completed)    Other Visit Diagnoses    Needs flu shot       Relevant Orders   Flu Vaccine QUAD 36+ mos IM (Completed)      Note: This dictation was prepared with Dragon dictation along with smaller phrase technology. Any transcriptional errors that result from this process are unintentional.

## 2017-07-17 NOTE — Patient Instructions (Addendum)
Referral to endocrinology for your diabetes  F/U 4 months

## 2017-07-17 NOTE — Assessment & Plan Note (Signed)
His diabetes is uncontrolled this been no movement in his A1c for about a year now. He does not bring his meter in with her difficulties with compliance in the past that he states that he is injecting his insulin. I'll be more concerned about trying to give him multiple doses of insulin throughout the day as I do not think that he is taking the nighttime dose regularly. He is on metformin as well as Januvia. Medicine him to an endocrinologist to see if maybe just focusing on diabetes at one appointment may help with some consistency. I have already tried healthcare network as well as other nurses within the home and this has not worked out.

## 2017-07-19 ENCOUNTER — Other Ambulatory Visit: Payer: Self-pay | Admitting: *Deleted

## 2017-07-19 MED ORDER — ATORVASTATIN CALCIUM 40 MG PO TABS: 40.0000 mg | ORAL_TABLET | Freq: Every day | ORAL | 3 refills | Status: DC

## 2017-08-14 ENCOUNTER — Ambulatory Visit: Payer: 59 | Admitting: "Endocrinology

## 2017-10-16 ENCOUNTER — Telehealth: Payer: Self-pay | Admitting: *Deleted

## 2017-10-16 NOTE — Telephone Encounter (Signed)
Agree, he does not take his insulin, reiterate to take his medications

## 2017-10-16 NOTE — Telephone Encounter (Signed)
Received call from [place], [place] with [facility].   States that she had visit with patient on 10/13/2017. Reports that patient noted to have +4 glucose in urine. States that she is aware last A1C >10. Encouraged patient to take all prescription medications as prescribed.   MD to be made aware.

## 2017-10-17 ENCOUNTER — Encounter: Payer: Self-pay | Admitting: Family Medicine

## 2017-10-26 ENCOUNTER — Encounter (INDEPENDENT_AMBULATORY_CARE_PROVIDER_SITE_OTHER): Payer: Self-pay | Admitting: Orthopaedic Surgery

## 2017-10-26 ENCOUNTER — Ambulatory Visit (INDEPENDENT_AMBULATORY_CARE_PROVIDER_SITE_OTHER): Payer: 59 | Admitting: Orthopaedic Surgery

## 2017-10-26 VITALS — BP 124/81 | HR 84 | Ht 68.0 in | Wt 238.0 lb

## 2017-10-26 DIAGNOSIS — M5030 Other cervical disc degeneration, unspecified cervical region: Secondary | ICD-10-CM

## 2017-10-26 MED ORDER — DIAZEPAM 5 MG PO TABS: ORAL_TABLET | ORAL | 0 refills | Status: DC

## 2017-10-26 NOTE — Progress Notes (Signed)
Office Visit Note   Patient: Kevin [name]           Date of Birth: 1956/05/16           MRN: [id number] Visit Date: 10/26/2017              Requested by: Alycia Rossetti, MD [address] [place], [place] [address] PCP: Alycia Rossetti, MD   Assessment & Plan: Visit Diagnoses:  1. DDD (degenerative disc disease), cervical     Plan: Prescription for Valium given he will take 2 tablets 1 hour before the exam, he will take the extra tablet with him and use if needed.  Office follow-up after cervical MRI scan and we can discussed operative options.  Follow-Up Instructions: No Follow-up on file.   Orders:  No orders of the defined types were placed in this encounter.  No orders of the defined types were placed in this encounter.     Procedures: No procedures performed   Clinical Data: No additional findings.   Subjective: Chief Complaint  Patient presents with  . Neck - Pain, Follow-up  . Right Knee - Pain, Follow-up    HPI 62 year old male returns with ongoing persistent neck pain left shoulder pain pain that radiates down to the radial side of his left hand.  He had a partial MRI scan of his cervical spine which showed broad-based disc osteophyte left.  Sagittal images were imaged and no axial images since patient could not tolerate it due to claustrophobia.  Been on hydrocodone and gabapentin for several months.  Previous partial MRI was in November 2017. Severe.  He has recurrent episodes when he is unable to sleep due to the pain.  States his left arm is weak he drops objects from his hand due to weakness.  Patient states he needs to have surgery to have this corrected. Review of Systems 14 point review of systems positive for diabetes with neuropathy.  Right knee primary osteoarthritis.  Chronic neck and left arm pain times greater than 2 years.  Hypertension esophageal dysphasia, anxiety, tremors elevated liver function test, low level literacy, history of  dental abscesses and specific sleep apnea.   Objective: Vital Signs: BP 124/81   Pulse 84   Ht 5\' 8"  (1.727 m)   Wt 238 lb (108 kg)   BMI 36.19 kg/m   Physical Exam  Constitutional: He is oriented to person, place, and time. He appears well-developed and well-nourished.  HENT:  Head: Normocephalic and atraumatic.  Eyes: EOM are normal. Pupils are equal, round, and reactive to light.  Neck: No tracheal deviation present. No thyromegaly present.  Cardiovascular: Normal rate.  Pulmonary/Chest: Effort normal. He has no wheezes.  Abdominal: Soft. Bowel sounds are normal.  Neurological: He is alert and oriented to person, place, and time.  Skin: Skin is warm and dry. Capillary refill takes less than 2 seconds.  Psychiatric: He has a normal mood and affect. His behavior is normal. Judgment and thought content normal.    Ortho Exam bilateral decreased sensation feet and ankles without plantar foot lesions.  Right knee crepitus with knee range of motion and more medial than lateral joint line tenderness.  Left brachial plexus tenderness.  Absent brachioradialis reflex on the left.  Weakness of wrist extension on the left normal on the right.  No thenar or hyperthenar atrophy.  No pain with carpal compression negative Phalen's test.  Lower extremity reflexes are 2+ and symmetrical.'s test at the wrist  is normal.  Specialty Comments:  No specialty comments available.  Imaging: No results found.   PMFS History: Patient Active Problem List   Diagnosis Date Noted  . Impingement syndrome of left shoulder 06/01/2017  . Protrusion of cervical intervertebral disc 06/01/2017  . Guaiac positive stools 03/09/2017  . Neck pain 12/01/2016  . DDD (degenerative disc disease), cervical 09/07/2015  . Musculoskeletal pain 05/29/2014  . Black eye, traumatic 02/17/2014  . Seasonal allergies 02/17/2014  . Onychomycosis 11/19/2013  . Dental abscess 09/27/2013  . Illiteracy and low-level literacy  05/27/2013  . Unspecified sleep apnea 05/15/2013  . Tremor 08/07/2012  . Elevated LFTs 08/07/2012  . Anxiety 06/27/2012  . Colon cancer screening 04/19/2012  . Macrocytosis without anemia 04/19/2012  . Esophageal dysphagia 03/27/2012  . OA (osteoarthritis) of knee 01/18/2012  . Diabetes mellitus with neurological manifestation ([place]) 12/23/2011  . Neuropathy, diabetic ([place]) 12/23/2011  . Leg pain 12/23/2011  . Obesity 12/11/2011  . Right knee pain 12/11/2011  . Leg edema 12/11/2011  . Essential hypertension, benign 12/08/2011  . Hyperlipidemia 12/08/2011   Past Medical History:  Diagnosis Date  . Arthritis   . Chronic knee pain   . Depression   . Diabetes mellitus ([place])   . Hyperlipidemia   . Hypertension   . Leg edema   . Obesity     Family History  Problem Relation Age of Onset  . Heart disease Unknown   . Arthritis Unknown   . Diabetes Unknown   . Cancer Mother        deceased    Past Surgical History:  Procedure Laterality Date  . none     Social History   Occupational History  . Not on file  Tobacco Use  . Smoking status: Current Every Day Smoker    Packs/day: 0.20  . Smokeless tobacco: Never Used  Substance and Sexual Activity  . Alcohol use: No  . Drug use: No  . Sexual activity: Not Currently

## 2017-10-27 ENCOUNTER — Encounter (INDEPENDENT_AMBULATORY_CARE_PROVIDER_SITE_OTHER): Payer: Self-pay | Admitting: Orthopaedic Surgery

## 2017-10-30 ENCOUNTER — Telehealth (INDEPENDENT_AMBULATORY_CARE_PROVIDER_SITE_OTHER): Payer: Self-pay | Admitting: *Deleted

## 2017-10-30 NOTE — Telephone Encounter (Signed)
Pt is scheduled for MRI on Mon Jan 28 at 11am, pt needs to arrive 1045 to register, lmtrc to pt for appt information

## 2017-11-01 NOTE — Telephone Encounter (Signed)
Pt called back and aware of appt 

## 2017-11-06 ENCOUNTER — Ambulatory Visit (HOSPITAL_COMMUNITY): Payer: 59

## 2017-11-09 ENCOUNTER — Ambulatory Visit (HOSPITAL_COMMUNITY)
Admission: RE | Admit: 2017-11-09 | Discharge: 2017-11-09 | Disposition: A | Discharge status: Home / Self Care | Payer: 59 | Source: Ambulatory Visit | Attending: Orthopaedic Surgery | Admitting: Orthopaedic Surgery

## 2017-11-09 DIAGNOSIS — M5030 Other cervical disc degeneration, unspecified cervical region: Secondary | ICD-10-CM | POA: Diagnosis present

## 2017-11-09 DIAGNOSIS — M50223 Other cervical disc displacement at C6-C7 level: Secondary | ICD-10-CM | POA: Diagnosis not present

## 2017-11-09 DIAGNOSIS — M1288 Other specific arthropathies, not elsewhere classified, other specified site: Secondary | ICD-10-CM | POA: Diagnosis not present

## 2017-11-17 ENCOUNTER — Ambulatory Visit: Payer: 59 | Admitting: Family Medicine

## 2017-11-24 ENCOUNTER — Ambulatory Visit (INDEPENDENT_AMBULATORY_CARE_PROVIDER_SITE_OTHER): Payer: 59 | Admitting: Family Medicine

## 2017-11-24 ENCOUNTER — Encounter: Payer: Self-pay | Admitting: Family Medicine

## 2017-11-24 VITALS — BP 130/74 | HR 92 | Temp 98.2°F | Ht 68.0 in | Wt 237.8 lb

## 2017-11-24 DIAGNOSIS — Z794 Long term (current) use of insulin: Secondary | ICD-10-CM | POA: Diagnosis not present

## 2017-11-24 DIAGNOSIS — I10 Essential (primary) hypertension: Secondary | ICD-10-CM | POA: Diagnosis not present

## 2017-11-24 DIAGNOSIS — E782 Mixed hyperlipidemia: Secondary | ICD-10-CM

## 2017-11-24 DIAGNOSIS — E0843 Diabetes mellitus due to underlying condition with diabetic autonomic (poly)neuropathy: Secondary | ICD-10-CM

## 2017-11-24 DIAGNOSIS — E0840 Diabetes mellitus due to underlying condition with diabetic neuropathy, unspecified: Secondary | ICD-10-CM

## 2017-11-24 DIAGNOSIS — Z114 Encounter for screening for human immunodeficiency virus [HIV]: Secondary | ICD-10-CM | POA: Diagnosis not present

## 2017-11-24 DIAGNOSIS — Z1159 Encounter for screening for other viral diseases: Secondary | ICD-10-CM

## 2017-11-24 MED ORDER — INSULIN GLARGINE 300 UNIT/ML ~~LOC~~ SOPN: 50.0000 [IU] | PEN_INJECTOR | Freq: Every day | SUBCUTANEOUS | 11 refills | Status: DC

## 2017-11-24 NOTE — Assessment & Plan Note (Addendum)
Continue gabapentin.

## 2017-11-24 NOTE — Patient Instructions (Signed)
F/U 4 months PHYSICAL  

## 2017-11-24 NOTE — Assessment & Plan Note (Signed)
Blood pressure is controlled no change to those medications.

## 2017-11-24 NOTE — Assessment & Plan Note (Signed)
Uncontrolled goal is to get his A1c at least less than 8% consistently.  I am not to change his medications he does not have his meter with him today we will rely on the results of his labs done

## 2017-11-24 NOTE — Progress Notes (Signed)
   Subjective:    Patient ID: Kevin [name], male    DOB: 1956-04-25, 62 y.o.   MRN: [id number]  Patient presents for Follow-up; Diabetes; and Hypertension  Pt here to f/u DM, often non compliant as evidenced by his labs and medication refills   DM- last A1C 10.6%, no medications with him, no meter with him, states not getting above 200  Injecting  50 units of Tujeo at night  He is still following with orthopedics for his neck/ arm pain has DDD in neck with narrowing    Diabetic neuropathy- taking gabapentin as prescribed which helps    HTN- taking BP meds per report   Needs to schedule his Eye doctor appointment   Review Of Systems:  GEN- denies fatigue, fever, weight loss,weakness, recent illness HEENT- denies eye drainage, change in vision, nasal discharge, CVS- denies chest pain, palpitations RESP- denies SOB, cough, wheeze ABD- denies N/V, change in stools, abd pain GU- denies dysuria, hematuria, dribbling, incontinence MSK- + joint pain, muscle aches, injury Neuro- denies headache, dizziness, syncope, seizure activity       Objective:    BP 130/74   Pulse 92   Temp 98.2 F (36.8 C)   Ht 5\' 8"  (1.727 m)   Wt 237 lb 12.8 oz (107.9 kg)   SpO2 96%   BMI 36.16 kg/m  GEN- NAD, alert and oriented x3 HEENT- PERRL, EOMI, non injected sclera, pink conjunctiva, MMM, oropharynx clear Neck- Supple, no thyromegaly CVS- RRR, no murmur RESP-CTAB ABD-NABS,soft,NT,ND EXT- No edema Pulses- Radial, DP- 2+        Assessment & Plan:      Problem List Items Addressed This Visit      Unprioritized   Hyperlipidemia   Relevant Orders   Lipid panel   Neuropathy, diabetic (HCC)    Continue gabapentin      Relevant Medications   Insulin Glargine (TOUJEO SOLOSTAR) 300 UNIT/ML SOPN   Essential hypertension, benign    Blood pressure is controlled no change to those medications.      Diabetes mellitus with neurological manifestation ([place]) - Primary    Uncontrolled  goal is to get his A1c at least less than 8% consistently.  I am not to change his medications he does not have his meter with him today we will rely on the results of his labs done      Relevant Medications   Insulin Glargine (TOUJEO SOLOSTAR) 300 UNIT/ML SOPN   Other Relevant Orders   CBC with Differential/Platelet   Comprehensive metabolic panel   Hemoglobin A1c   HM DIABETES FOOT EXAM (Completed)    Other Visit Diagnoses    Encounter for hepatitis C screening test for low risk patient       Relevant Orders   Hepatitis C antibody   Encounter for screening for HIV       Relevant Orders   HIV antibody      Note: This dictation was prepared with Dragon dictation along with smaller phrase technology. Any transcriptional errors that result from this process are unintentional.

## 2017-11-25 LAB — LIPID PANEL
Cholesterol: 201 mg/dL — ABNORMAL HIGH (ref <200)
HDL: 40 mg/dL — ABNORMAL LOW (ref >40)
LDL Cholesterol (Calc): 126 mg/dL (calc) — ABNORMAL HIGH
Non-HDL Cholesterol (Calc): 161 mg/dL (calc) — ABNORMAL HIGH (ref <130)
Total CHOL/HDL Ratio: 5 (calc) — ABNORMAL HIGH (ref <5.0)
Triglycerides: 210 mg/dL — ABNORMAL HIGH (ref <150)

## 2017-11-25 LAB — COMPREHENSIVE METABOLIC PANEL
AG Ratio: 1.4 (calc) (ref 1.0–2.5)
ALT: 39 U/L (ref 9–46)
AST: 42 U/L — ABNORMAL HIGH (ref 10–35)
Albumin: 4.2 g/dL (ref 3.6–5.1)
Alkaline phosphatase (APISO): 80 U/L (ref 40–115)
BUN: 9 mg/dL (ref 7–25)
CO2: 23 mmol/L (ref 20–32)
Calcium: 9.6 mg/dL (ref 8.6–10.3)
Chloride: 100 mmol/L (ref 98–110)
Creat: 0.84 mg/dL (ref 0.70–1.25)
Globulin: 3 g/dL (calc) (ref 1.9–3.7)
Glucose, Bld: 294 mg/dL — ABNORMAL HIGH (ref 65–99)
Potassium: 4.4 mmol/L (ref 3.5–5.3)
Sodium: 135 mmol/L (ref 135–146)
Total Bilirubin: 0.6 mg/dL (ref 0.2–1.2)
Total Protein: 7.2 g/dL (ref 6.1–8.1)

## 2017-11-25 LAB — CBC WITH DIFFERENTIAL/PLATELET
Basophils Absolute: 137 cells/uL (ref 0–200)
Basophils Relative: 1.2 %
Eosinophils Absolute: 410 cells/uL (ref 15–500)
Eosinophils Relative: 3.6 %
HCT: 48.3 % (ref 38.5–50.0)
Hemoglobin: 16.9 g/dL (ref 13.2–17.1)
Lymphs Abs: 2964 cells/uL (ref 850–3900)
MCH: 32.2 pg (ref 27.0–33.0)
MCHC: 35 g/dL (ref 32.0–36.0)
MCV: 92 fL (ref 80.0–100.0)
MPV: 10.9 fL (ref 7.5–12.5)
Monocytes Relative: 6.3 %
Neutro Abs: 7171 cells/uL (ref 1500–7800)
Neutrophils Relative %: 62.9 %
Platelets: 216 10*3/uL (ref 140–400)
RBC: 5.25 10*6/uL (ref 4.20–5.80)
RDW: 11.1 % (ref 11.0–15.0)
Total Lymphocyte: 26 %
WBC mixed population: 718 cells/uL (ref 200–950)
WBC: 11.4 10*3/uL — ABNORMAL HIGH (ref 3.8–10.8)

## 2017-11-25 LAB — HEMOGLOBIN A1C
Hgb A1c MFr Bld: 12.5 % of total Hgb — ABNORMAL HIGH (ref <5.7)
Mean Plasma Glucose: 312 (calc)
eAG (mmol/L): 17.3 (calc)

## 2017-11-25 LAB — HEPATITIS C ANTIBODY
Hepatitis C Ab: NONREACTIVE
SIGNAL TO CUT-OFF: 0.04 (ref <1.00)

## 2017-11-25 LAB — HIV ANTIBODY (ROUTINE TESTING W REFLEX): HIV 1&2 Ab, 4th Generation: NONREACTIVE

## 2017-11-30 ENCOUNTER — Other Ambulatory Visit: Payer: Self-pay | Admitting: *Deleted

## 2017-11-30 DIAGNOSIS — Z794 Long term (current) use of insulin: Principal | ICD-10-CM

## 2017-11-30 DIAGNOSIS — E0840 Diabetes mellitus due to underlying condition with diabetic neuropathy, unspecified: Secondary | ICD-10-CM

## 2017-11-30 DIAGNOSIS — E0843 Diabetes mellitus due to underlying condition with diabetic autonomic (poly)neuropathy: Secondary | ICD-10-CM

## 2017-12-07 ENCOUNTER — Encounter (INDEPENDENT_AMBULATORY_CARE_PROVIDER_SITE_OTHER): Payer: Self-pay | Admitting: Orthopaedic Surgery

## 2017-12-07 ENCOUNTER — Ambulatory Visit (INDEPENDENT_AMBULATORY_CARE_PROVIDER_SITE_OTHER): Payer: 59 | Admitting: Orthopaedic Surgery

## 2017-12-07 VITALS — BP 143/85 | HR 90 | Ht 68.0 in | Wt 231.0 lb

## 2017-12-07 DIAGNOSIS — M5020 Other cervical disc displacement, unspecified cervical region: Secondary | ICD-10-CM | POA: Diagnosis not present

## 2017-12-07 NOTE — Progress Notes (Signed)
Office Visit Note   Patient: Kevin [name]           Date of Birth: [birth year]           MRN: [id number] Visit Date: 12/07/2017              Requested by: Alycia Rossetti, MD [address] [place], [place] [address] PCP: Alycia Rossetti, MD   Assessment & Plan: Visit Diagnoses:  1. Protrusion of cervical intervertebral disc     Plan: I discussed with the patient that he has cervical disc pathology in his neck at C6-7 with severe by foraminal and central stenosis present.  Single level C6-7 fusion is a surgical option but he needs to have his A1c below 7.5 to meet the criteria for elective surgery.  He is also having problems with knee arthritis and similarly needs correction of his A1c for this as well.  He will see his medical doctor for work on this and can return once his diabetes is under control.  Long discussion held with patient about the importance of diabetic control and higher complication rate with diabetes being out of control for elective surgery.  Follow-Up Instructions: No Follow-up on file.   Orders:  No orders of the defined types were placed in this encounter.  No orders of the defined types were placed in this encounter.     Procedures: No procedures performed   Clinical Data: No additional findings.   Subjective: Chief Complaint  Patient presents with  . Neck - Pain, Follow-up    MRI review    HPI returns and is gotten an MRI scan cervical spine.  This shows severe by foraminal stenosis at C6-7 with central disc protrusion loss of CSF space both anterior and posterior to the cord without myelopathic changes in the cord.  He continues to have significant weakness in his arm with wrist extension weakness on the left.  Continues to have numbness more in his left than right hand.  He also was having problems with right knee osteoarthritis.  Diabetes is in poor control with A1c of 12.5 done 13 days ago.  Review of Systems dated and unchanged  from last office visit.  Of note is diabetic neuropathy and poor diabetic control with elevated A1c.   Objective: Vital Signs: BP (!) 143/85   Pulse 90   Ht 5\' 8"  (1.727 m)   Wt 231 lb (104.8 kg)   BMI 35.12 kg/m   Physical Exam  Constitutional: He is oriented to person, place, and time. He appears well-developed and well-nourished.  HENT:  Head: Normocephalic and atraumatic.  Eyes: EOM are normal. Pupils are equal, round, and reactive to light.  Neck: No tracheal deviation present. No thyromegaly present.  Cardiovascular: Normal rate.  Pulmonary/Chest: Effort normal. He has no wheezes.  Abdominal: Soft. Bowel sounds are normal.  Neurological: He is alert and oriented to person, place, and time.  Skin: Skin is warm and dry. Capillary refill takes less than 2 seconds.  Psychiatric: He has a normal mood and affect. His behavior is normal. Judgment and thought content normal.    Ortho Exam positive brachial plexus tenderness on the left absent brachioradialis reflex on the left.  Weakness left wrist extension.  Grip strength is good biceps triceps is normal.  Right knee crepitus with knee range of motion.  He is ambulatory with a cane.  Decreased sensation both feet without plantar foot lesions.  Specialty Comments:  No specialty  comments available.  Imaging: Study Result   CLINICAL DATA:  Neck pain radiating into the right arm for 2 years. No known injury.  EXAM: MRI CERVICAL SPINE WITHOUT CONTRAST  TECHNIQUE: Multiplanar, multisequence MR imaging of the cervical spine was performed. No intravenous contrast was administered.  COMPARISON:  MRI cervical spine 10/21/2015. This exam consists only of sagittal T1 and T2 weighted images as the patient could not complete the study.  FINDINGS: Alignment: Maintained.  Vertebrae: No fracture or worrisome lesion.  Cord: Normal signal throughout.  Posterior Fossa, vertebral arteries, paraspinal tissues:  Negative.  Disc levels:  C2-3: Advanced facet arthropathy on the left causes moderately severe foraminal narrowing. The central canal and right foramen are open.  C3-4: Left worse than right facet degenerative disease. There is a shallow disc bulge and uncovertebral spurring. The central canal is open. Moderately severe to severe left and mild to moderate right foraminal narrowing is seen.  C4-5: Advanced facet degenerative disease on the left. There is a shallow disc bulge and left much worse than right uncovertebral spurring. There is mild flattening of the ventral cord and severe left foraminal narrowing. The right foramen is open.  C5-6: Left worse than right facet degenerative change and uncovertebral spurring. Minimal disc bulge is identified. Severe left and mild right foraminal narrowing is seen. The central canal is open.  C6-7: Diffuse disc bulge and uncovertebral spurring are seen. The ventral cord is flattened. There is severe bilateral foraminal narrowing.  C7-T1: Negative.  IMPRESSION: Given right upper extremity symptoms, dominant findings are at C6-7 where there is severe bilateral foraminal narrowing due to uncovertebral disease and a disc bulge flattens the ventral cord.  Uncovertebral spurring and facet arthropathy on the left cause left foraminal narrowing from C2-3 to C5-6 as described above. Mild flattening of the ventral cord at C4-5 due to disc is noted. Intervertebral disc spaces are otherwise unremarkable.   Electronically Signed   By: Inge Rise M.D.   On: 11/09/2017 15:06       PMFS History: Patient Active Problem List   Diagnosis Date Noted  . Impingement syndrome of left shoulder 06/01/2017  . Protrusion of cervical intervertebral disc 06/01/2017  . Guaiac positive stools 03/09/2017  . Neck pain 12/01/2016  . DDD (degenerative disc disease), cervical 09/07/2015  . Musculoskeletal pain 05/29/2014  . Black eye,  traumatic 02/17/2014  . Seasonal allergies 02/17/2014  . Onychomycosis 11/19/2013  . Dental abscess 09/27/2013  . Illiteracy and low-level literacy 05/27/2013  . Unspecified sleep apnea 05/15/2013  . Tremor 08/07/2012  . Elevated LFTs 08/07/2012  . Anxiety 06/27/2012  . Colon cancer screening 04/19/2012  . Macrocytosis without anemia 04/19/2012  . Esophageal dysphagia 03/27/2012  . OA (osteoarthritis) of knee 01/18/2012  . Diabetes mellitus with neurological manifestation ([place]) 12/23/2011  . Neuropathy, diabetic ([place]) 12/23/2011  . Leg pain 12/23/2011  . Obesity 12/11/2011  . Right knee pain 12/11/2011  . Leg edema 12/11/2011  . Essential hypertension, benign 12/08/2011  . Hyperlipidemia 12/08/2011   Past Medical History:  Diagnosis Date  . Arthritis   . Chronic knee pain   . Depression   . Diabetes mellitus ([place])   . Hyperlipidemia   . Hypertension   . Leg edema   . Obesity     Family History  Problem Relation Age of Onset  . Heart disease Unknown   . Arthritis Unknown   . Diabetes Unknown   . Cancer Mother        deceased  Past Surgical History:  Procedure Laterality Date  . none     Social History   Occupational History  . Not on file  Tobacco Use  . Smoking status: Current Every Day Smoker    Packs/day: 0.20  . Smokeless tobacco: Never Used  Substance and Sexual Activity  . Alcohol use: No  . Drug use: No  . Sexual activity: Not Currently

## 2017-12-29 ENCOUNTER — Encounter: Payer: Self-pay | Admitting: Endocrinology

## 2017-12-29 ENCOUNTER — Ambulatory Visit (INDEPENDENT_AMBULATORY_CARE_PROVIDER_SITE_OTHER): Payer: Medicare Other | Admitting: Endocrinology

## 2017-12-29 VITALS — BP 130/68 | HR 90 | Wt 242.4 lb

## 2017-12-29 DIAGNOSIS — E0840 Diabetes mellitus due to underlying condition with diabetic neuropathy, unspecified: Secondary | ICD-10-CM

## 2017-12-29 DIAGNOSIS — Z794 Long term (current) use of insulin: Secondary | ICD-10-CM | POA: Diagnosis not present

## 2017-12-29 MED ORDER — INSULIN GLARGINE 300 UNIT/ML ~~LOC~~ SOPN: 60.0000 [IU] | PEN_INJECTOR | SUBCUTANEOUS | 11 refills | Status: DC

## 2017-12-29 NOTE — Progress Notes (Signed)
Subjective:    Patient ID: Kevin [name], male    DOB: 07/25/1956, 62 y.o.   MRN: [id number]  HPI pt is referred by Dr Buelah Manis, for diabetes.  Pt states DM was dx'ed in 2000; he has moderate neuropathy of the lower extremities; he is unaware of any associated chronic complications; he has been on insulin since 2001; pt says his diet and exercise are good; he has never had pancreatitis, pancreatic surgery, severe hypoglycemia or DKA.  He takes toujeo 50/d, and 2 oral meds.  He says cbg's vary vrom 100-200's.  It is in general higher as the day goes on.  He says he never misses the insulin.   Past Medical History:  Diagnosis Date  . Arthritis   . Chronic knee pain   . Depression   . Diabetes mellitus ([place])   . Hyperlipidemia   . Hypertension   . Leg edema   . Obesity     Past Surgical History:  Procedure Laterality Date  . none      Social History   Socioeconomic History  . Marital status: Single    Spouse name: Not on file  . Number of children: 0  . Years of education: Not on file  . Highest education level: Not on file  Occupational History  . Not on file  Social Needs  . Financial resource strain: Not on file  . Food insecurity:    Worry: Not on file    Inability: Not on file  . Transportation needs:    Medical: Not on file    Non-medical: Not on file  Tobacco Use  . Smoking status: Current Every Day Smoker    Packs/day: 0.20  . Smokeless tobacco: Never Used  Substance and Sexual Activity  . Alcohol use: No  . Drug use: No  . Sexual activity: Not Currently  Lifestyle  . Physical activity:    Days per week: Not on file    Minutes per session: Not on file  . Stress: Not on file  Relationships  . Social connections:    Talks on phone: Not on file    Gets together: Not on file    Attends religious service: Not on file    Active member of club or organization: Not on file    Attends meetings of clubs or organizations: Not on file    Relationship status:  Not on file  . Intimate partner violence:    Fear of current or ex partner: Not on file    Emotionally abused: Not on file    Physically abused: Not on file    Forced sexual activity: Not on file  Other Topics Concern  . Not on file  Social History Narrative   Patient's POA is his sister, [name] [phone].            Current Outpatient Medications on File Prior to Visit  Medication Sig Dispense Refill  . amLODipine (NORVASC) 5 MG tablet Take 1 tablet (5 mg total) by mouth daily. For blood pressure 90 tablet 1  . aspirin EC 81 MG tablet Take 81 mg by mouth daily.    [name] atorvastatin (LIPITOR) 40 MG tablet Take 1 tablet (40 mg total) by mouth daily. 90 tablet 3  . Blood Glucose Monitoring Suppl (BLOOD GLUCOSE SYSTEM PAK) KIT Use to monitor FSBS 1x daily. Dx: E11.9 1 each 1  . cetirizine (ZYRTEC) 10 MG tablet Take 1 tablet (10 mg total) by mouth daily. For allergies 90  tablet 3  . clotrimazole (LOTRIMIN) 1 % cream Apply 1 application topically 2 (two) times daily. 30 g 1  . clotrimazole-betamethasone (LOTRISONE) cream Apply 1 application topically 2 (two) times daily. 30 g 11  . diclofenac (VOLTAREN) 75 MG EC tablet TAKE (1) TABLET BY MOUTH TWICE DAILY FOR INFLAMMATION. 60 tablet 11  . diclofenac sodium (VOLTAREN) 1 % GEL APPLY TO THE KNEE THREE TIMES DAILY AS NEEDED. 408 g 0  . folic acid (FOLVITE) 1 MG tablet Take 1 tablet (1 mg total) by mouth daily. 90 tablet 3  . gabapentin (NEURONTIN) 100 MG capsule Take 1-2 capsules at bedtime for arm pain 60 capsule 3  . Glucose Blood (BLOOD GLUCOSE TEST STRIPS) STRP Use to monitor FSBS 3x daily. Dx: E11.65. 100 each 3  . HYDROcodone-acetaminophen (NORCO/VICODIN) 5-325 MG tablet Take 1 tablet by mouth 2 (two) times daily as needed. 60 tablet 0  . Insulin Pen Needle 29G X 12.7MM MISC Use as directed 100 each 11  . Lancet Devices MISC Use to monitor FSBS 1x daily. Dx: E11.9 1 each 1  . Lancets MISC Use to monitor FSBS 1x daily. Dx: E11.9 100 each  1  . metFORMIN (GLUCOPHAGE) 1000 MG tablet Take 1 tablet (1,000 mg total) by mouth 2 (two) times daily with a meal. For diabetes 180 tablet 3  . nitroGLYCERIN (NITROSTAT) 0.4 MG SL tablet PLACE 1 TAB UNDER TONGUE EVERY 5 MIN IF NEEDED FOR CHEST PAIN. MAY USE 3 TIMES.NO RELIEF CALL 911. 25 tablet 0  . potassium chloride (K-DUR) 10 MEQ tablet Take 1 tablet (10 mEq total) by mouth 2 (two) times daily. Take with fluid pill 180 tablet 3  . sitaGLIPtin (JANUVIA) 100 MG tablet Take 1 tablet (100 mg total) by mouth daily. 90 tablet 3  . FLUoxetine (PROZAC) 20 MG capsule Take 1 capsule (20 mg total) by mouth daily. For your nerves 90 capsule 3  . furosemide (LASIX) 40 MG tablet Take 1 tablet (40 mg total) by mouth 2 (two) times daily. For fluid 180 tablet 3   No current facility-administered medications on file prior to visit.     Allergies  Allergen Reactions  . Lisinopril Cough    Family History  Problem Relation Age of Onset  . Heart disease Unknown   . Arthritis Unknown   . Diabetes Unknown   . Cancer Mother        deceased    BP 130/68 (BP Location: Left Wrist, Patient Position: Sitting, Cuff Size: Normal)   Pulse 90   Wt 242 lb 6.4 oz (110 kg)   SpO2 96%   BMI 36.86 kg/m     Review of Systems denies weight loss, blurry vision, headache, chest pain, sob, n/v, urinary frequency, muscle cramps, excessive diaphoresis, memory loss, depression, cold intolerance, rhinorrhea, hypoglycemia, and easy bruising.       Objective:   Physical Exam VS: see vs page GEN: no distress HEAD: head: no deformity eyes: no periorbital swelling, no proptosis external nose and ears are normal mouth: no lesion seen NECK: supple, thyroid is not enlarged.   CHEST WALL: no deformity LUNGS: clear to auscultation CV: reg rate and rhythm, no murmur ABD: abdomen is soft, nontender.  no hepatosplenomegaly.  not distended.  no hernia MUSCULOSKELETAL: muscle bulk and strength are grossly normal.  no  obvious joint swelling.  gait is normal and steady EXTEMITIES: no deformity.  no ulcer on the feet.  feet are of normal color and temp.  1+ bilat leg  edema, and bilat vv's.  There is bilateral onychomycosis of the toenails.   PULSES: dorsalis pedis intact bilat.  no carotid bruit NEURO:  cn 2-12 grossly intact.   readily moves all 4's.  sensation is intact to touch on the feet.   SKIN:  Normal texture and temperature.  No suspicious lesion is visible.  Slight red rash on the feet. NODES:  None palpable at the neck PSYCH: alert, well-oriented.  Does not appear anxious nor depressed.   Lab Results  Component Value Date   CREATININE 0.84 11/24/2017   BUN 9 11/24/2017   NA 135 11/24/2017   K 4.4 11/24/2017   CL 100 11/24/2017   CO2 23 11/24/2017   Lab Results  Component Value Date   HGBA1C 12.5 (H) 11/24/2017   I have reviewed outside records, and summarized: Pt was noted to have severely elevated a1c, and referred here.  H/o medication noncompliance was noted.       Assessment & Plan:  Insulin-requiring type 2 DM, with polyneuropathy: severe exacerbation Medical noncompliance, by hx.  He is not a candidate for multiple daily injections   Patient Instructions  good diet and exercise significantly improve the control of your diabetes.  please let me know if you wish to be referred to a dietician.  high blood sugar is very risky to your health.  you should see an eye doctor and dentist every year.  It is very important to get all recommended vaccinations.   Controlling your blood pressure and cholesterol drastically reduces the damage diabetes does to your body.  Those who smoke should quit.  Please discuss these with your doctor.  check your blood sugar twice a day.  vary the time of day when you check, between before the 3 meals, and at bedtime.  also check if you have symptoms of your blood sugar being too high or too low.  please keep a record of the readings and bring it to your  next appointment here (or you can bring the meter itself).  You can write it on any piece of paper.  please call us sooner if your blood sugar goes below 70, or if you have a lot of readings over 200.  We will need to take this complex situation in stages For now, please: Increase the toujeo to 60 units each morning, and: Please continue the same diabetes pills. Please come back for a follow-up appointment in 2 weeks.

## 2017-12-29 NOTE — Patient Instructions (Addendum)
good diet and exercise significantly improve the control of your diabetes.  please let me know if you wish to be referred to a dietician.  high blood sugar is very risky to your health.  you should see an eye doctor and dentist every year.  It is very important to get all recommended vaccinations.   Controlling your blood pressure and cholesterol drastically reduces the damage diabetes does to your body.  Those who smoke should quit.  Please discuss these with your doctor.  check your blood sugar twice a day.  vary the time of day when you check, between before the 3 meals, and at bedtime.  also check if you have symptoms of your blood sugar being too high or too low.  please keep a record of the readings and bring it to your next appointment here (or you can bring the meter itself).  You can write it on any piece of paper.  please call us sooner if your blood sugar goes below 70, or if you have a lot of readings over 200.  We will need to take this complex situation in stages For now, please: Increase the toujeo to 60 units each morning, and: Please continue the same diabetes pills. Please come back for a follow-up appointment in 2 weeks.

## 2018-01-04 DIAGNOSIS — L84 Corns and callosities: Secondary | ICD-10-CM | POA: Diagnosis not present

## 2018-01-04 DIAGNOSIS — E1142 Type 2 diabetes mellitus with diabetic polyneuropathy: Secondary | ICD-10-CM | POA: Diagnosis not present

## 2018-01-04 DIAGNOSIS — B351 Tinea unguium: Secondary | ICD-10-CM | POA: Diagnosis not present

## 2018-01-04 DIAGNOSIS — M79676 Pain in unspecified toe(s): Secondary | ICD-10-CM | POA: Diagnosis not present

## 2018-01-15 ENCOUNTER — Ambulatory Visit (INDEPENDENT_AMBULATORY_CARE_PROVIDER_SITE_OTHER): Payer: Medicare Other | Admitting: Endocrinology

## 2018-01-15 ENCOUNTER — Encounter: Payer: Self-pay | Admitting: Endocrinology

## 2018-01-15 VITALS — BP 140/78 | HR 60 | Wt 239.0 lb

## 2018-01-15 DIAGNOSIS — E0840 Diabetes mellitus due to underlying condition with diabetic neuropathy, unspecified: Secondary | ICD-10-CM | POA: Diagnosis not present

## 2018-01-15 DIAGNOSIS — Z794 Long term (current) use of insulin: Secondary | ICD-10-CM

## 2018-01-15 MED ORDER — INSULIN GLARGINE 300 UNIT/ML ~~LOC~~ SOPN: 70.0000 [IU] | PEN_INJECTOR | SUBCUTANEOUS | 11 refills | Status: DC

## 2018-01-15 NOTE — Progress Notes (Signed)
Subjective:    Patient ID: Kevin [name], male    DOB: 18-Feb-1956, 62 y.o.   MRN: [id number]  HPI Pt returns for f/u of diabetes mellitus: DM type: Insulin-requiring type 2. Dx'ed: 1761 Complications: polyneuropathy Therapy: insulin since 2001, and metformin DKA: never Severe hypoglycemia: never Pancreatitis: never Pancreatic imaging: never Other: he is not a candidate for multiple daily injections, due to h/o noncompliance Pt reports symptoms: none today. Pt takes meds as rx'ed. Since the insulin was increased, cbg's are in the low-100's.  There is no trend throughout the day.  pt states he feels well in general.   Past Medical History:  Diagnosis Date  . Arthritis   . Chronic knee pain   . Depression   . Diabetes mellitus ([place])   . Hyperlipidemia   . Hypertension   . Leg edema   . Obesity     Past Surgical History:  Procedure Laterality Date  . none      Social History   Socioeconomic History  . Marital status: Single    Spouse name: Not on file  . Number of children: 0  . Years of education: Not on file  . Highest education level: Not on file  Occupational History  . Not on file  Social Needs  . Financial resource strain: Not on file  . Food insecurity:    Worry: Not on file    Inability: Not on file  . Transportation needs:    Medical: Not on file    Non-medical: Not on file  Tobacco Use  . Smoking status: Current Every Day Smoker    Packs/day: 0.20  . Smokeless tobacco: Never Used  Substance and Sexual Activity  . Alcohol use: No  . Drug use: No  . Sexual activity: Not Currently  Lifestyle  . Physical activity:    Days per week: Not on file    Minutes per session: Not on file  . Stress: Not on file  Relationships  . Social connections:    Talks on phone: Not on file    Gets together: Not on file    Attends religious service: Not on file    Active member of club or organization: Not on file    Attends meetings of clubs or  organizations: Not on file    Relationship status: Not on file  . Intimate partner violence:    Fear of current or ex partner: Not on file    Emotionally abused: Not on file    Physically abused: Not on file    Forced sexual activity: Not on file  Other Topics Concern  . Not on file  Social History Narrative   Patient's POA is his sister, [name] [phone].            Current Outpatient Medications on File Prior to Visit  Medication Sig Dispense Refill  . amLODipine (NORVASC) 5 MG tablet Take 1 tablet (5 mg total) by mouth daily. For blood pressure 90 tablet 1  . aspirin EC 81 MG tablet Take 81 mg by mouth daily.    [name] atorvastatin (LIPITOR) 40 MG tablet Take 1 tablet (40 mg total) by mouth daily. 90 tablet 3  . Blood Glucose Monitoring Suppl (BLOOD GLUCOSE SYSTEM PAK) KIT Use to monitor FSBS 1x daily. Dx: E11.9 1 each 1  . cetirizine (ZYRTEC) 10 MG tablet Take 1 tablet (10 mg total) by mouth daily. For allergies 90 tablet 3  . clotrimazole (LOTRIMIN) 1 % cream Apply 1  application topically 2 (two) times daily. 30 g 1  . clotrimazole-betamethasone (LOTRISONE) cream Apply 1 application topically 2 (two) times daily. 30 g 11  . diclofenac (VOLTAREN) 75 MG EC tablet TAKE (1) TABLET BY MOUTH TWICE DAILY FOR INFLAMMATION. 60 tablet 11  . diclofenac sodium (VOLTAREN) 1 % GEL APPLY TO THE KNEE THREE TIMES DAILY AS NEEDED. 100 g 0  . FLUoxetine (PROZAC) 20 MG capsule Take 1 capsule (20 mg total) by mouth daily. For your nerves 90 capsule 3  . folic acid (FOLVITE) 1 MG tablet Take 1 tablet (1 mg total) by mouth daily. 90 tablet 3  . furosemide (LASIX) 40 MG tablet Take 1 tablet (40 mg total) by mouth 2 (two) times daily. For fluid 180 tablet 3  . gabapentin (NEURONTIN) 100 MG capsule Take 1-2 capsules at bedtime for arm pain 60 capsule 3  . Glucose Blood (BLOOD GLUCOSE TEST STRIPS) STRP Use to monitor FSBS 3x daily. Dx: E11.65. 100 each 3  . HYDROcodone-acetaminophen (NORCO/VICODIN) 5-325 MG  tablet Take 1 tablet by mouth 2 (two) times daily as needed. 60 tablet 0  . Insulin Pen Needle 29G X 12.7MM MISC Use as directed 100 each 11  . Lancet Devices MISC Use to monitor FSBS 1x daily. Dx: E11.9 1 each 1  . Lancets MISC Use to monitor FSBS 1x daily. Dx: E11.9 100 each 1  . metFORMIN (GLUCOPHAGE) 1000 MG tablet Take 1 tablet (1,000 mg total) by mouth 2 (two) times daily with a meal. For diabetes 180 tablet 3  . nitroGLYCERIN (NITROSTAT) 0.4 MG SL tablet PLACE 1 TAB UNDER TONGUE EVERY 5 MIN IF NEEDED FOR CHEST PAIN. MAY USE 3 TIMES.NO RELIEF CALL 911. 25 tablet 0  . potassium chloride (K-DUR) 10 MEQ tablet Take 1 tablet (10 mEq total) by mouth 2 (two) times daily. Take with fluid pill 180 tablet 3   No current facility-administered medications on file prior to visit.     Allergies  Allergen Reactions  . Lisinopril Cough    Family History  Problem Relation Age of Onset  . Heart disease Unknown   . Arthritis Unknown   . Diabetes Unknown   . Cancer Mother        deceased    BP 140/78 (BP Location: Left Arm, Patient Position: Sitting, Cuff Size: Normal)   Pulse 60   Wt 239 lb (108.4 kg)   SpO2 94%   BMI 36.34 kg/m    Review of Systems He denies hypoglycemia.      Objective:   Physical Exam VITAL SIGNS:  See vs page.  GENERAL: no distress.  Pulses: dorsalis pedis intact bilat.   MSK: no deformity of the feet.  CV: 1+ bilat leg edema, and bilat vv's Skin:  no ulcer on the feet.  normal color and temp on the feet. Neuro: sensation is intact to touch on the feet, but decreased from normal.  Ext: There is bilateral onychomycosis of the toenails.    Lab Results  Component Value Date   CREATININE 0.84 11/24/2017   BUN 9 11/24/2017   NA 135 11/24/2017   K 4.4 11/24/2017   CL 100 11/24/2017   CO2 23 11/24/2017        Assessment & Plan:  Insulin-requiring type 2 DM, with polyneuropathy: he needs increased rx.   Patient Instructions  check your blood sugar twice  a day.  vary the time of day when you check, between before the 3 meals, and at bedtime.  also   check if you have symptoms of your blood sugar being too high or too low.  please keep a record of the readings and bring it to your next appointment here (or you can bring the meter itself).  You can write it on any piece of paper.  please call us sooner if your blood sugar goes below 70, or if you have a lot of readings over 200.  For now, please: Increase the toujeo to 70 units each morning, and: Stop the taking the [place].  Please come back for a follow-up appointment in 6 weeks.

## 2018-01-15 NOTE — Patient Instructions (Addendum)
check your blood sugar twice a day.  vary the time of day when you check, between before the 3 meals, and at bedtime.  also check if you have symptoms of your blood sugar being too high or too low.  please keep a record of the readings and bring it to your next appointment here (or you can bring the meter itself).  You can write it on any piece of paper.  please call us sooner if your blood sugar goes below 70, or if you have a lot of readings over 200.  For now, please: Increase the toujeo to 70 units each morning, and: Stop the taking the [place].  Please come back for a follow-up appointment in 6 weeks.

## 2018-01-25 ENCOUNTER — Emergency Department (HOSPITAL_COMMUNITY): Payer: Medicare Other

## 2018-01-25 ENCOUNTER — Other Ambulatory Visit: Payer: Self-pay

## 2018-01-25 ENCOUNTER — Emergency Department (HOSPITAL_COMMUNITY)
Admission: EM | Admit: 2018-01-25 | Discharge: 2018-01-25 | Disposition: A | Discharge status: Home / Self Care | Payer: Medicare Other | Attending: Emergency Medicine | Admitting: Emergency Medicine

## 2018-01-25 ENCOUNTER — Encounter (HOSPITAL_COMMUNITY): Payer: Self-pay | Admitting: *Deleted

## 2018-01-25 DIAGNOSIS — S9782XA Crushing injury of left foot, initial encounter: Secondary | ICD-10-CM | POA: Diagnosis not present

## 2018-01-25 DIAGNOSIS — S99922A Unspecified injury of left foot, initial encounter: Secondary | ICD-10-CM

## 2018-01-25 DIAGNOSIS — Y9389 Activity, other specified: Secondary | ICD-10-CM | POA: Insufficient documentation

## 2018-01-25 DIAGNOSIS — M79672 Pain in left foot: Secondary | ICD-10-CM | POA: Diagnosis not present

## 2018-01-25 DIAGNOSIS — F17210 Nicotine dependence, cigarettes, uncomplicated: Secondary | ICD-10-CM | POA: Insufficient documentation

## 2018-01-25 DIAGNOSIS — S91312A Laceration without foreign body, left foot, initial encounter: Secondary | ICD-10-CM | POA: Insufficient documentation

## 2018-01-25 DIAGNOSIS — W208XXA Other cause of strike by thrown, projected or falling object, initial encounter: Secondary | ICD-10-CM | POA: Insufficient documentation

## 2018-01-25 DIAGNOSIS — E1149 Type 2 diabetes mellitus with other diabetic neurological complication: Secondary | ICD-10-CM | POA: Insufficient documentation

## 2018-01-25 DIAGNOSIS — I10 Essential (primary) hypertension: Secondary | ICD-10-CM | POA: Insufficient documentation

## 2018-01-25 DIAGNOSIS — Z79899 Other long term (current) drug therapy: Secondary | ICD-10-CM | POA: Diagnosis not present

## 2018-01-25 DIAGNOSIS — M7989 Other specified soft tissue disorders: Secondary | ICD-10-CM | POA: Diagnosis not present

## 2018-01-25 DIAGNOSIS — Z794 Long term (current) use of insulin: Secondary | ICD-10-CM | POA: Diagnosis not present

## 2018-01-25 DIAGNOSIS — Y9289 Other specified places as the place of occurrence of the external cause: Secondary | ICD-10-CM | POA: Insufficient documentation

## 2018-01-25 DIAGNOSIS — L089 Local infection of the skin and subcutaneous tissue, unspecified: Secondary | ICD-10-CM

## 2018-01-25 DIAGNOSIS — Z7982 Long term (current) use of aspirin: Secondary | ICD-10-CM | POA: Insufficient documentation

## 2018-01-25 DIAGNOSIS — Y999 Unspecified external cause status: Secondary | ICD-10-CM | POA: Insufficient documentation

## 2018-01-25 LAB — BASIC METABOLIC PANEL
Anion gap: 12 (ref 5–15)
BUN: 11 mg/dL (ref 6–20)
CO2: 26 mmol/L (ref 22–32)
Calcium: 8.9 mg/dL (ref 8.9–10.3)
Chloride: 100 mmol/L — ABNORMAL LOW (ref 101–111)
Creatinine, Ser: 0.67 mg/dL (ref 0.61–1.24)
GFR calc Af Amer: >60 mL/min (ref >60)
GFR calc non Af Amer: >60 mL/min (ref >60)
Glucose, Bld: 214 mg/dL — ABNORMAL HIGH (ref 65–99)
Potassium: 4.4 mmol/L (ref 3.5–5.1)
Sodium: 138 mmol/L (ref 135–145)

## 2018-01-25 LAB — CBC
HCT: 48.6 % (ref 39.0–52.0)
Hemoglobin: 16.5 g/dL (ref 13.0–17.0)
MCH: 32.4 pg (ref 26.0–34.0)
MCHC: 34 g/dL (ref 30.0–36.0)
MCV: 95.5 fL (ref 78.0–100.0)
Platelets: 222 10*3/uL (ref 150–400)
RBC: 5.09 MIL/uL (ref 4.22–5.81)
RDW: 11.7 % (ref 11.5–15.5)
WBC: 13 10*3/uL — ABNORMAL HIGH (ref 4.0–10.5)

## 2018-01-25 MED ORDER — VANCOMYCIN HCL IN DEXTROSE 1-5 GM/200ML-% IV SOLN
1000.0000 mg | Freq: Once | INTRAVENOUS | Status: AC
  Administered 2018-01-25: 1000 mg via INTRAVENOUS
  Filled 2018-01-25: qty 200

## 2018-01-25 MED ORDER — DOXYCYCLINE HYCLATE 100 MG PO CAPS: 100.0000 mg | ORAL_CAPSULE | Freq: Two times a day (BID) | ORAL | 0 refills | Status: DC

## 2018-01-25 NOTE — ED Notes (Signed)
Patient transported to X-ray 

## 2018-01-25 NOTE — ED Triage Notes (Signed)
Pt states he dropped something on his left foot x 2 days ago; pt has redness and swelling to foot; top of foot has an abrasion in the middle with bruising and redness

## 2018-01-25 NOTE — Discharge Instructions (Signed)
Take the antibiotic as directed.  Soaking her foot at home in warm water and if you want to use Epsom salts that is okay for 20 minutes each day would be helpful.  Make an appointment to follow-up with your doctor.  Take the antibiotic as directed.  Use the orthopedic shoe as needed for comfort.  Return for any new or worse symptoms.

## 2018-01-25 NOTE — ED Provider Notes (Signed)
[facility] EMERGENCY DEPARTMENT Provider Note   CSN: [id number] Arrival date & time: 01/25/18  1156     History   Chief Complaint Chief Complaint  Patient presents with  . Foot Swelling    HPI Kevin [name] is a 62 y.o. male.  Patient with an injury to his left foot 2 days ago.  He had a piece of furniture chest of drawers fall on the foot.  Resulting in a crush injury pain and also a superficial wound to the top of the foot.  Patient states his last tetanus was 5 years ago.  Denies any other injuries.  Patient is a diabetic.  Patient denies any pus draining from the wound.  Does have a lot of foot pain.  Able to ambulate but it is painful.  Patient does state that as long as the foot is not touch that pain is not significant.     Past Medical History:  Diagnosis Date  . Arthritis   . Chronic knee pain   . Depression   . Diabetes mellitus ([place])   . Hyperlipidemia   . Hypertension   . Leg edema   . Obesity     Patient Active Problem List   Diagnosis Date Noted  . Impingement syndrome of left shoulder 06/01/2017  . Protrusion of cervical intervertebral disc 06/01/2017  . Guaiac positive stools 03/09/2017  . Neck pain 12/01/2016  . DDD (degenerative disc disease), cervical 09/07/2015  . Musculoskeletal pain 05/29/2014  . Black eye, traumatic 02/17/2014  . Seasonal allergies 02/17/2014  . Onychomycosis 11/19/2013  . Dental abscess 09/27/2013  . Illiteracy and low-level literacy 05/27/2013  . Unspecified sleep apnea 05/15/2013  . Tremor 08/07/2012  . Elevated LFTs 08/07/2012  . Anxiety 06/27/2012  . Colon cancer screening 04/19/2012  . Macrocytosis without anemia 04/19/2012  . Esophageal dysphagia 03/27/2012  . OA (osteoarthritis) of knee 01/18/2012  . Diabetes mellitus with neurological manifestation ([place]) 12/23/2011  . Neuropathy, diabetic ([place]) 12/23/2011  . Leg pain 12/23/2011  . Obesity 12/11/2011  . Right knee pain 12/11/2011  . Leg edema 12/11/2011   . Essential hypertension, benign 12/08/2011  . Hyperlipidemia 12/08/2011    Past Surgical History:  Procedure Laterality Date  . none          Home Medications    Prior to Admission medications   Medication Sig Start Date End Date Taking? Authorizing Provider  amLODipine (NORVASC) 5 MG tablet Take 1 tablet (5 mg total) by mouth daily. For blood pressure 11/11/16  Yes [place], Modena Nunnery, MD  aspirin EC 81 MG tablet Take 81 mg by mouth daily.   Yes [provider]  atorvastatin (LIPITOR) 40 MG tablet Take 1 tablet (40 mg total) by mouth daily. 07/19/17  Yes [place], Modena Nunnery, MD  clotrimazole (LOTRIMIN) 1 % cream Apply 1 application topically 2 (two) times daily. 03/13/17  Yes [place], Modena Nunnery, MD  diclofenac sodium (VOLTAREN) 1 % GEL APPLY TO THE KNEE THREE TIMES DAILY AS NEEDED. 10/14/16  Yes [place], Modena Nunnery, MD  FLUoxetine (PROZAC) 20 MG capsule Take 1 capsule (20 mg total) by mouth daily. For your nerves 11/11/16 01/25/18 Yes [place], Modena Nunnery, MD  folic acid (FOLVITE) 1 MG tablet Take 1 tablet (1 mg total) by mouth daily. 11/11/16  Yes [place], Modena Nunnery, MD  furosemide (LASIX) 40 MG tablet Take 1 tablet (40 mg total) by mouth 2 (two) times daily. For fluid 11/11/16 01/25/18 Yes [place], Modena Nunnery, MD  gabapentin (NEURONTIN) 100  MG capsule Take 1-2 capsules at bedtime for arm pain 11/07/16  Yes [place], Modena Nunnery, MD  Glucose Blood (BLOOD GLUCOSE TEST STRIPS) STRP Use to monitor FSBS 3x daily. Dx: E11.65. 05/09/16  Yes [place], Modena Nunnery, MD  HYDROcodone-acetaminophen (NORCO/VICODIN) 5-325 MG tablet Take 1 tablet by mouth 2 (two) times daily as needed. 05/15/17  Yes [place], Modena Nunnery, MD  Insulin Glargine (TOUJEO SOLOSTAR) 300 UNIT/ML SOPN Inject 70 Units into the skin every morning. 01/15/18  Yes Renato Shin, MD  metFORMIN (GLUCOPHAGE) 1000 MG tablet Take 1 tablet (1,000 mg total) by mouth 2 (two) times daily with a meal. For diabetes 03/13/17 03/13/18 Yes [place], Modena Nunnery, MD  potassium  chloride (K-DUR) 10 MEQ tablet Take 1 tablet (10 mEq total) by mouth 2 (two) times daily. Take with fluid pill 11/11/16  Yes [place], Modena Nunnery, MD  diclofenac (VOLTAREN) 75 MG EC tablet TAKE (1) TABLET BY MOUTH TWICE DAILY FOR INFLAMMATION. 09/21/15   Alycia Rossetti, MD  doxycycline (VIBRAMYCIN) 100 MG capsule Take 1 capsule (100 mg total) by mouth 2 (two) times daily. 01/25/18   Fredia Sorrow, MD  nitroGLYCERIN (NITROSTAT) 0.4 MG SL tablet PLACE 1 TAB UNDER TONGUE EVERY 5 MIN IF NEEDED FOR CHEST PAIN. MAY USE 3 TIMES.NO RELIEF CALL 911. 06/21/17   Alycia Rossetti, MD    Family History Family History  Problem Relation Age of Onset  . Heart disease Unknown   . Arthritis Unknown   . Diabetes Unknown   . Cancer Mother        deceased    Social History Social History   Tobacco Use  . Smoking status: Current Every Day Smoker    Packs/day: 0.20  . Smokeless tobacco: Never Used  Substance Use Topics  . Alcohol use: No  . Drug use: No     Allergies   Lisinopril   Review of Systems Review of Systems  Constitutional: Negative for fever.  HENT: Negative for congestion.   Eyes: Negative for redness.  Respiratory: Negative for shortness of breath.   Cardiovascular: Negative for chest pain.  Gastrointestinal: Negative for abdominal pain.  Genitourinary: Negative for dysuria.  Musculoskeletal: Negative for back pain.  Skin: Positive for wound.  Neurological: Negative for headaches.  Hematological: Does not bruise/bleed easily.  Psychiatric/Behavioral: Negative for confusion.     Physical Exam Updated Vital Signs BP 134/77   Pulse 87   Temp 97.8 F (36.6 C) (Oral)   Resp 15   Ht 1.727 m (5\' 8" )   Wt 109.3 kg (241 lb)   SpO2 97%   BMI 36.64 kg/m   Physical Exam  Constitutional: He is oriented to person, place, and time. He appears well-developed and well-nourished. No distress.  HENT:  Head: Normocephalic and atraumatic.  Mouth/Throat: Oropharynx is clear and  moist.  Eyes: Pupils are equal, round, and reactive to light. Conjunctivae and EOM are normal.  Neck: Normal range of motion. Neck supple.  Cardiovascular: Normal rate, regular rhythm and normal heart sounds.  Pulmonary/Chest: Effort normal and breath sounds normal.  Abdominal: Soft. Bowel sounds are normal. There is no tenderness.  Musculoskeletal: He exhibits edema and tenderness.  Superficial laceration over the top of the left foot.  About mid foot.  Surrounded by erythema contusion may be some ecchymosis from the toes all the way to ankle area.  No streaking of redness going up the leg.  No purulent discharge.  But very tender around the area of the laceration.  Difficult to  tell whether secondary due to the crush injury from the piece of furniture falling on it or whether it is secondary cellulitis.  Cap refill to distal toes is intact.  Neurological: He is alert and oriented to person, place, and time. No cranial nerve deficit or sensory deficit. He exhibits normal muscle tone. Coordination normal.  Skin: Skin is warm.  Nursing note and vitals reviewed.    ED Treatments / Results  Labs (all labs ordered are listed, but only abnormal results are displayed) Labs Reviewed  CBC - Abnormal; Notable for the following components:      Result Value   WBC 13.0 (*)    All other components within normal limits  BASIC METABOLIC PANEL - Abnormal; Notable for the following components:   Chloride 100 (*)    Glucose, Bld 214 (*)    All other components within normal limits    EKG None  Radiology Dg Foot Complete Left  Result Date: 01/25/2018 CLINICAL DATA:  Acute LEFT foot pain following injury several days ago. Initial encounter. EXAM: LEFT FOOT - COMPLETE 3+ VIEW COMPARISON:  03/28/2005 FINDINGS: There is no evidence of acute fracture, subluxation or dislocation. Dorsal soft tissue swelling is noted. No radiopaque foreign body is identified. No focal bony lesions are present. IMPRESSION:  Soft tissue swelling without acute bony abnormality. Electronically Signed   By: Margarette Canada M.D.   On: 01/25/2018 12:53    Procedures Procedures (including critical care time)  Medications Ordered in ED Medications  vancomycin (VANCOCIN) IVPB 1000 mg/200 mL premix (0 mg Intravenous Stopped 01/25/18 1443)     Initial Impression / Assessment and Plan / ED Course  I have reviewed the triage vital signs and the nursing notes.  Pertinent labs & imaging results that were available during my care of the patient were reviewed by me and considered in my medical decision making (see chart for details).     Patient dropped a piece of furniture on his left foot 2 days ago.  Has a lot of bruising the top of the foot soft tissue swelling some component that suggestive of perhaps a superficial infection and some cellulitis.  But difficult to differentiate for certain between just bruising and tenderness secondary to the injury.  X-rays are negative show no gas in soft tissues.  However patient will be treated since she is a diabetic as a possible cellulitis received IV vancomycin here and will be continued on doxycycline.  Family members given careful instructions watching for worsening of the infection.  He will make an appointment to follow-up with his primary care doctor.  Labs without significant abnormalities.  Patient is stated is a known diabetic.  In addition patient seems to do fine with the orthopedic shoe able to walk with a cane.  Patient does not feel that he needs additional pain medicine nor clinically does not appear that it is necessary.  Final Clinical Impressions(s) / ED Diagnoses   Final diagnoses:  Injury of left foot, initial encounter  Left foot infection    ED Discharge Orders        Ordered    doxycycline (VIBRAMYCIN) 100 MG capsule  2 times daily     01/25/18 1556       Fredia Sorrow, MD 01/25/18 1620

## 2018-02-06 ENCOUNTER — Other Ambulatory Visit: Payer: Self-pay

## 2018-02-06 ENCOUNTER — Encounter: Payer: Self-pay | Admitting: Family Medicine

## 2018-02-06 ENCOUNTER — Ambulatory Visit (INDEPENDENT_AMBULATORY_CARE_PROVIDER_SITE_OTHER): Payer: Medicare Other | Admitting: Family Medicine

## 2018-02-06 VITALS — BP 138/78 | HR 80 | Temp 98.4°F | Resp 16 | Ht 68.0 in | Wt 240.0 lb

## 2018-02-06 DIAGNOSIS — T148XXA Other injury of unspecified body region, initial encounter: Secondary | ICD-10-CM

## 2018-02-06 DIAGNOSIS — S91312D Laceration without foreign body, left foot, subsequent encounter: Secondary | ICD-10-CM | POA: Diagnosis not present

## 2018-02-06 MED ORDER — HYDROCODONE-ACETAMINOPHEN 5-325 MG PO TABS: 1.0000 | ORAL_TABLET | Freq: Two times a day (BID) | ORAL | 0 refills | Status: DC | PRN

## 2018-02-06 NOTE — Patient Instructions (Signed)
Ice foot  Keep clean and elevate Take pain medicine  Wear the shoe F/U 1 week for recheck

## 2018-02-06 NOTE — Progress Notes (Signed)
   Subjective:    Patient ID: Kevin [name], male    DOB: [birth year], 62 y.o.   MRN: [id number]  Patient presents for ER F/U (S/P crush injury to L Foot- dropped drawer on foot- no Fx) Pt  Seen in ED on 4/18 after a dresser drawer fell on his left foot. He went to ER the next day, had a scab with some bleeding Xray no fracture, but soft tissue swelling  Given Antibiotics for probable cellulitis  Around the laceration- given IV Vancomycin  in ER sent home with doxycycline for 14 days  Labs showed elevated WBC He has been soaking in epson salt, swelling has gone down some, still has pain and tingling sensation near laceration  Seen by endocrinology, CBG still elevated, Tujeo changed to 70 units in the morning, and Januvia was discontinued  States highest CBG 139  Review Of Systems:  GEN- denies fatigue, fever, weight loss,weakness, recent illness HEENT- denies eye drainage, change in vision, nasal discharge, CVS- denies chest pain, palpitations RESP- denies SOB, cough, wheeze ABD- denies N/V, change in stools, abd pain GU- denies dysuria, hematuria, dribbling, incontinence MSK- +joint pain, muscle aches, injury Neuro- denies headache, dizziness, syncope, seizure activity       Objective:    BP 138/78   Pulse 80   Temp 98.4 F (36.9 C) (Oral)   Resp 16   Ht 5\' 8"  (1.727 m)   Wt 240 lb (108.9 kg)   SpO2 97%   BMI 36.49 kg/m  GEN- NAD, alert and oriented x3 HEENT- PERRL, EOMI, non injected sclera, pink conjunctiva, MMM,  CVS- RRR, no murmur RESP-CTAB EXT-  Trace ankle edema, laceration topmid left foot 3cm long, small hematoma adjancent TTP, bruising 8x9cm region across foot and 2nd digit  Pulses- Radial, DP- 2+        Assessment & Plan:      Problem List Items Addressed This Visit    None    Visit Diagnoses    Laceration of left foot, subsequent encounter    -  Primary   still has some swelling, no acute bleeding still quite tender, will have him ICE, given  norco, elevate foot, stay in post op shoe, recheck in 1 week   Hematoma          Note: This dictation was prepared with Dragon dictation along with smaller phrase technology. Any transcriptional errors that result from this process are unintentional.

## 2018-02-13 ENCOUNTER — Other Ambulatory Visit: Payer: Self-pay

## 2018-02-13 ENCOUNTER — Encounter: Payer: Self-pay | Admitting: Family Medicine

## 2018-02-13 ENCOUNTER — Ambulatory Visit (HOSPITAL_COMMUNITY): Payer: Medicaid Other

## 2018-02-13 ENCOUNTER — Ambulatory Visit (HOSPITAL_COMMUNITY): Payer: Medicare Other

## 2018-02-13 ENCOUNTER — Ambulatory Visit (INDEPENDENT_AMBULATORY_CARE_PROVIDER_SITE_OTHER): Payer: Medicare Other | Admitting: Family Medicine

## 2018-02-13 VITALS — BP 136/82 | HR 76 | Temp 98.2°F | Resp 14 | Ht 68.0 in | Wt 242.0 lb

## 2018-02-13 DIAGNOSIS — S91312D Laceration without foreign body, left foot, subsequent encounter: Secondary | ICD-10-CM

## 2018-02-13 DIAGNOSIS — T148XXA Other injury of unspecified body region, initial encounter: Secondary | ICD-10-CM | POA: Diagnosis not present

## 2018-02-13 NOTE — Progress Notes (Signed)
   Subjective:    Patient ID: Kevin [name], male    DOB: [birth year], 62 y.o.   MRN: [id number]  Patient presents for Follow-up    Pt here for intermin f/u on left foot laceration and cellulitis. 3 weeks ago a dressor fell on his foot    He completed antibiotics, used ICE and elevated and soaked in epsom salt. Still as stinging and burning. Wearing pos top shoe   CBG this AM 174 Pain medicine Norco made him droswy  Xray- from 4/18    IMPRESSION: Soft tissue swelling without acute bony abnormality.  Review Of Systems:  GEN- denies fatigue, fever, weight loss,weakness, recent illness CVS- denies chest pain, palpitations RESP- denies SOB, cough, wheeze MSK- +joint pain, muscle aches, injury        Objective:    BP 136/82   Pulse 76   Temp 98.2 F (36.8 C) (Oral)   Resp 14   Ht 5\' 8"  (1.727 m)   Wt 242 lb (109.8 kg)   SpO2 97%   BMI 36.80 kg/m  GEN- NAD, alert and oriented x3 EXT-  Trace ankle edema, laceration topmid left foot 3cm long, mild bleeding, small hematoma adjancent  Very TTP, bruising 8x9cm region across foot and 2nd digit with peeling skin  , no change in size No warmth, no odor  Pulses- Radia 2+  DP- palpated           Assessment & Plan:      Problem List Items Addressed This Visit    None    Visit Diagnoses    Foot laceration, left, subsequent encounter    -  Primary   Very slow healing hematoma, laceration, will send to wound doctor, no further antibiotics needed. can take 1/2 tablet of pain medicine, continue post op shoe , he is diabetic, poorly controlled    Relevant Orders   Ambulatory referral to [facility] [place] has helped also keeping elevated    Relevant Orders   Ambulatory referral to Wound Clinic      Note: This dictation was prepared with Dragon dictation along with smaller phrase technology. Any transcriptional errors that result from this process are unintentional.

## 2018-02-13 NOTE — Patient Instructions (Addendum)
F/U as previous 

## 2018-02-26 ENCOUNTER — Ambulatory Visit (INDEPENDENT_AMBULATORY_CARE_PROVIDER_SITE_OTHER): Payer: Medicare Other | Admitting: Endocrinology

## 2018-02-26 ENCOUNTER — Encounter: Payer: Self-pay | Admitting: Endocrinology

## 2018-02-26 VITALS — BP 122/68 | HR 80 | Wt 240.4 lb

## 2018-02-26 DIAGNOSIS — E0840 Diabetes mellitus due to underlying condition with diabetic neuropathy, unspecified: Secondary | ICD-10-CM | POA: Diagnosis not present

## 2018-02-26 DIAGNOSIS — Z794 Long term (current) use of insulin: Secondary | ICD-10-CM | POA: Diagnosis not present

## 2018-02-26 LAB — POCT GLYCOSYLATED HEMOGLOBIN (HGB A1C): Hemoglobin A1C: 10.1 % — AB (ref 4.0–5.6)

## 2018-02-26 MED ORDER — INSULIN GLARGINE 300 UNIT/ML ~~LOC~~ SOPN: 90.0000 [IU] | PEN_INJECTOR | SUBCUTANEOUS | 11 refills | Status: DC

## 2018-02-26 MED ORDER — AMOXICILLIN-POT CLAVULANATE 875-125 MG PO TABS: 1.0000 | ORAL_TABLET | Freq: Two times a day (BID) | ORAL | 0 refills | Status: AC

## 2018-02-26 NOTE — Patient Instructions (Addendum)
check your blood sugar twice a day.  vary the time of day when you check, between before the 3 meals, and at bedtime.  also check if you have symptoms of your blood sugar being too high or too low.  please keep a record of the readings and bring it to your next appointment here (or you can bring the meter itself).  You can write it on any piece of paper.  please call us sooner if your blood sugar goes below 70, or if you have a lot of readings over 200.  For now, please: Increase the toujeo to 90 units each morning, and:  I have sent a prescription to your pharmacy, for an antibiotic pill You will get better much faster if you elevate your foot above the rest of your body.  Please see the foot specialist as scheduled tomorrow.   Please call or message [place] next week, to tell us how the blood sugar is doing.   Please come back for a follow-up appointment in 2 months.

## 2018-02-26 NOTE — Progress Notes (Signed)
Subjective:    Patient ID: Kevin [name], male    DOB: [birth year], 62 y.o.   MRN: [id number]  HPI Pt returns for f/u of diabetes mellitus: DM type: Insulin-requiring type 2. Dx'ed: 3976 Complications: polyneuropathy Therapy: insulin since 2001, and metformin DKA: never Severe hypoglycemia: never Pancreatitis: never Pancreatic imaging: never Other: he is not a candidate for multiple daily injections, due to h/o noncompliance Pt reports symptoms: none today. Pt takes meds as rx'ed. Since the insulin was increased, cbg's are in the low-100's.  There is no trend throughout the day.  pt states he feels well in general.  Pt says he never misses the insulin.   2 weeks ago, he dropped a piece of furniture on the left foot.  He was seen in ER, where x-ray was neg for fx.  He has appt with podiatry tomorrow.   Past Medical History:  Diagnosis Date  . Arthritis   . Chronic knee pain   . Depression   . Diabetes mellitus ([place])   . Hyperlipidemia   . Hypertension   . Leg edema   . Obesity     Past Surgical History:  Procedure Laterality Date  . none      Social History   Socioeconomic History  . Marital status: Single    Spouse name: Not on file  . Number of children: 0  . Years of education: Not on file  . Highest education level: Not on file  Occupational History  . Not on file  Social Needs  . Financial resource strain: Not on file  . Food insecurity:    Worry: Not on file    Inability: Not on file  . Transportation needs:    Medical: Not on file    Non-medical: Not on file  Tobacco Use  . Smoking status: Current Every Day Smoker    Packs/day: 0.20  . Smokeless tobacco: Never Used  Substance and Sexual Activity  . Alcohol use: No  . Drug use: No  . Sexual activity: Not Currently  Lifestyle  . Physical activity:    Days per week: Not on file    Minutes per session: Not on file  . Stress: Not on file  Relationships  . Social connections:    Talks on  phone: Not on file    Gets together: Not on file    Attends religious service: Not on file    Active member of club or organization: Not on file    Attends meetings of clubs or organizations: Not on file    Relationship status: Not on file  . Intimate partner violence:    Fear of current or ex partner: Not on file    Emotionally abused: Not on file    Physically abused: Not on file    Forced sexual activity: Not on file  Other Topics Concern  . Not on file  Social History Narrative   Patient's POA is his sister, [name] [phone].            Current Outpatient Medications on File Prior to Visit  Medication Sig Dispense Refill  . amLODipine (NORVASC) 5 MG tablet Take 1 tablet (5 mg total) by mouth daily. For blood pressure 90 tablet 1  . aspirin EC 81 MG tablet Take 81 mg by mouth daily.    [name] atorvastatin (LIPITOR) 40 MG tablet Take 1 tablet (40 mg total) by mouth daily. 90 tablet 3  . clotrimazole (LOTRIMIN) 1 % cream Apply 1 application  topically 2 (two) times daily. 30 g 1  . diclofenac (VOLTAREN) 75 MG EC tablet TAKE (1) TABLET BY MOUTH TWICE DAILY FOR INFLAMMATION. 60 tablet 11  . diclofenac sodium (VOLTAREN) 1 % GEL APPLY TO THE KNEE THREE TIMES DAILY AS NEEDED. 100 g 0  . FLUoxetine (PROZAC) 20 MG capsule Take 1 capsule (20 mg total) by mouth daily. For your nerves 90 capsule 3  . folic acid (FOLVITE) 1 MG tablet Take 1 tablet (1 mg total) by mouth daily. 90 tablet 3  . furosemide (LASIX) 40 MG tablet Take 1 tablet (40 mg total) by mouth 2 (two) times daily. For fluid 180 tablet 3  . gabapentin (NEURONTIN) 100 MG capsule Take 1-2 capsules at bedtime for arm pain 60 capsule 3  . Glucose Blood (BLOOD GLUCOSE TEST STRIPS) STRP Use to monitor FSBS 3x daily. Dx: E11.65. 100 each 3  . metFORMIN (GLUCOPHAGE) 1000 MG tablet Take 1 tablet (1,000 mg total) by mouth 2 (two) times daily with a meal. For diabetes 180 tablet 3  . nitroGLYCERIN (NITROSTAT) 0.4 MG SL tablet PLACE 1 TAB  UNDER TONGUE EVERY 5 MIN IF NEEDED FOR CHEST PAIN. MAY USE 3 TIMES.NO RELIEF CALL 911. 25 tablet 0  . potassium chloride (K-DUR) 10 MEQ tablet Take 1 tablet (10 mEq total) by mouth 2 (two) times daily. Take with fluid pill 180 tablet 3   No current facility-administered medications on file prior to visit.     Allergies  Allergen Reactions  . Lisinopril Cough    Family History  Problem Relation Age of Onset  . Heart disease Unknown   . Arthritis Unknown   . Diabetes Unknown   . Cancer Mother        deceased    BP 122/68   Pulse 80   Wt 240 lb 6.4 oz (109 kg)   SpO2 96%   BMI 36.55 kg/m   Review of Systems Denies fever and hypoglycemia.      Objective:   Physical Exam VITAL SIGNS:  See vs page.  GENERAL: no distress.  Pulses: dorsalis pedis intact bilat.   MSK: no deformity of the feet.  CV: 1+ bilat leg edema, and bilat vv's Skin:  At the dorsal aspect of the left foot, there is slight tend/swell/erythema/warmth, with a shallow ulcer.  normal color and temp on the feet. Neuro: sensation is intact to touch on the feet, but decreased from normal.  Ext: There is bilateral onychomycosis of the toenails.   Lab Results  Component Value Date   HGBA1C 10.1 (A) 02/26/2018       Assessment & Plan:  Insulin-requiring type 2 DM, with polyneuropathy: he needs increased rx Foot contusion, with ulcer, new.   Patient Instructions  check your blood sugar twice a day.  vary the time of day when you check, between before the 3 meals, and at bedtime.  also check if you have symptoms of your blood sugar being too high or too low.  please keep a record of the readings and bring it to your next appointment here (or you can bring the meter itself).  You can write it on any piece of paper.  please call us sooner if your blood sugar goes below 70, or if you have a lot of readings over 200.  For now, please: Increase the toujeo to 90 units each morning, and:  I have sent a prescription to  your pharmacy, for an antibiotic pill You will get better much faster  if you elevate your foot above the rest of your body.  Please see the foot specialist as scheduled tomorrow.   Please call or message [place] next week, to tell us how the blood sugar is doing.   Please come back for a follow-up appointment in 2 months.

## 2018-02-27 ENCOUNTER — Telehealth: Payer: Self-pay | Admitting: Family Medicine

## 2018-02-27 DIAGNOSIS — I10 Essential (primary) hypertension: Secondary | ICD-10-CM | POA: Diagnosis not present

## 2018-02-27 DIAGNOSIS — Z79899 Other long term (current) drug therapy: Secondary | ICD-10-CM | POA: Diagnosis not present

## 2018-02-27 DIAGNOSIS — Z794 Long term (current) use of insulin: Secondary | ICD-10-CM | POA: Diagnosis not present

## 2018-02-27 DIAGNOSIS — E1140 Type 2 diabetes mellitus with diabetic neuropathy, unspecified: Secondary | ICD-10-CM | POA: Diagnosis not present

## 2018-02-27 DIAGNOSIS — S9032XD Contusion of left foot, subsequent encounter: Secondary | ICD-10-CM | POA: Diagnosis not present

## 2018-02-27 DIAGNOSIS — Z7982 Long term (current) use of aspirin: Secondary | ICD-10-CM | POA: Diagnosis not present

## 2018-02-27 DIAGNOSIS — S9032XA Contusion of left foot, initial encounter: Secondary | ICD-10-CM | POA: Diagnosis not present

## 2018-02-27 DIAGNOSIS — E785 Hyperlipidemia, unspecified: Secondary | ICD-10-CM | POA: Diagnosis not present

## 2018-02-27 DIAGNOSIS — L03116 Cellulitis of left lower limb: Secondary | ICD-10-CM | POA: Diagnosis not present

## 2018-02-27 NOTE — Telephone Encounter (Signed)
Received phone call from the wound center of [facility] [place] stating that patient has a [place] plan instead of a PPO plan and Dr.Nichols is out of network with that plan. We will need to call [phone] select option 3 and inform them we need a geographical gap extension so that patient may be seen by Dr. Nils Pyle. REF number is [id number]. Was told by [facility] wound center to request 12 visits. Patient is indeed of an appointment this week.

## 2018-02-28 NOTE — Telephone Encounter (Signed)
I called and spoke with Shem at [facility]. He states that he sees this request is still pending. He states that April initiated the PA and asked if we had sent supporting documentation I explained to [facility] that April wasn't in this office and that it was her request that I call and follow up with this PA.   He is going to contact April with any updates. He does have it under geographical gap extension and if any clinical review documentation is needed he will request from April.

## 2018-03-22 DIAGNOSIS — L84 Corns and callosities: Secondary | ICD-10-CM | POA: Diagnosis not present

## 2018-03-22 DIAGNOSIS — E1142 Type 2 diabetes mellitus with diabetic polyneuropathy: Secondary | ICD-10-CM | POA: Diagnosis not present

## 2018-03-22 DIAGNOSIS — B351 Tinea unguium: Secondary | ICD-10-CM | POA: Diagnosis not present

## 2018-03-22 DIAGNOSIS — M79676 Pain in unspecified toe(s): Secondary | ICD-10-CM | POA: Diagnosis not present

## 2018-03-27 ENCOUNTER — Other Ambulatory Visit: Payer: Self-pay

## 2018-03-27 ENCOUNTER — Encounter: Payer: Self-pay | Admitting: Family Medicine

## 2018-03-27 ENCOUNTER — Ambulatory Visit (INDEPENDENT_AMBULATORY_CARE_PROVIDER_SITE_OTHER): Payer: Medicare Other | Admitting: Family Medicine

## 2018-03-27 VITALS — BP 122/78 | HR 68 | Temp 98.4°F | Resp 14 | Ht 68.0 in | Wt 245.0 lb

## 2018-03-27 DIAGNOSIS — E0843 Diabetes mellitus due to underlying condition with diabetic autonomic (poly)neuropathy: Secondary | ICD-10-CM | POA: Diagnosis not present

## 2018-03-27 DIAGNOSIS — Z1211 Encounter for screening for malignant neoplasm of colon: Secondary | ICD-10-CM

## 2018-03-27 DIAGNOSIS — Z550 Illiteracy and low-level literacy: Secondary | ICD-10-CM | POA: Diagnosis not present

## 2018-03-27 DIAGNOSIS — E0840 Diabetes mellitus due to underlying condition with diabetic neuropathy, unspecified: Secondary | ICD-10-CM | POA: Diagnosis not present

## 2018-03-27 DIAGNOSIS — Z125 Encounter for screening for malignant neoplasm of prostate: Secondary | ICD-10-CM | POA: Diagnosis not present

## 2018-03-27 DIAGNOSIS — I10 Essential (primary) hypertension: Secondary | ICD-10-CM | POA: Diagnosis not present

## 2018-03-27 DIAGNOSIS — Z794 Long term (current) use of insulin: Secondary | ICD-10-CM | POA: Diagnosis not present

## 2018-03-27 DIAGNOSIS — Z0000 Encounter for general adult medical examination without abnormal findings: Secondary | ICD-10-CM

## 2018-03-27 MED ORDER — GABAPENTIN 100 MG PO CAPS: ORAL_CAPSULE | ORAL | 3 refills | Status: DC

## 2018-03-27 NOTE — Patient Instructions (Addendum)
Call the number for the cologuard Schedule your eye exam  Shingles and TDAP sent to pharmacy  F/U 4 months Dr.Pickard

## 2018-03-27 NOTE — Progress Notes (Signed)
Subjective:   Patient presents for Medicare Annual/Subsequent preventive examination.    No new concerns Following with endocrinology his diabetes is uncontrolled.  He states that he is taking his 90 units of insulin as prescribed.  Also seen by the wound care for his foot he now just has a scab but no more swelling or pain.  Also followed by the foot doctor he has had his nails trimmed.  Review Past Medical/Family/Social: Per EMR    Risk Factors  Current exercise habits: None Dietary issues discussed: Yes  Cardiac risk factors: Obesity (BMI >= 30 kg/m2). DM, HTN, hyperlipidemia   Depression Screen  (Note: if answer to either of the following is "Yes", a more complete depression screening is indicated)  Over the past two weeks, have you felt down, depressed or hopeless? No Over the past two weeks, have you felt little interest or pleasure in doing things? No Have you lost interest or pleasure in daily life? No Do you often feel hopeless? No Do you cry easily over simple problems? No   Activities of Daily Living  In your present state of health, do you have any difficulty performing the following activities?:  Driving? No  Managing money? No  Feeding yourself? No  Getting from bed to chair? No  Climbing a flight of stairs? No  Preparing food and eating?: No  Bathing or showering? No  Getting dressed: No  Getting to the toilet? No  Using the toilet:No  Moving around from place to place: No  In the past year have you fallen or had a near fall?:Yes  Are you sexually active? No  Do you have more than one partner? No   Hearing Difficulties: No  Do you often ask people to speak up or repeat themselves? No  Do you experience ringing or noises in your ears? No Do you have difficulty understanding soft or whispered voices? No  Do you feel that you have a problem with memory? No Do you often misplace items? No  Do you feel safe at home? Yes  Cognitive Testing  Alert? Yes  Normal Appearance?Yes  Oriented to person? Yes Place? Yes  Time? Yes  Recall of three objects? Yes  Can perform simple calculations? Yes  Displays appropriate judgment?Yes  Can read the correct time from a watch face?Yes   List the Names of Other Physician/Practitioners you currently use:  Podiatry, Endocrinology   Screening Tests / Date Colonoscopy     Due                Pneumonia- UTD Influenza Vaccine - UTD Shingles Due  Tetanus/tdap - Due  ROS: GEN- denies fatigue, fever, weight loss,weakness, recent illness HEENT- denies eye drainage, change in vision, nasal discharge, CVS- denies chest pain, palpitations RESP- denies SOB, cough, wheeze ABD- denies N/V, change in stools, abd pain GU- denies dysuria, hematuria, dribbling, incontinence MSK- denies joint pain, muscle aches, injury Neuro- denies headache, dizziness, syncope, seizure activity  Physical GEN- NAD, alert and oriented x3 HEENT- PERRL, EOMI, non injected sclera, pink conjunctiva, MMM, oropharynx clear Neck- Supple, no thryomegaly CVS- RRR, no murmur RESP-CTAB ABD-NABS,soft,NT,ND EXT- No edema Pulses- Radial, DP- 2+ Left foot scab mid foot, no erythema    Assessment:    Annual wellness medicare exam   Plan:    During the course of the visit the patient was educated and counseled about appropriate screening and preventive services including:   Immunization shingles and tetanus vaccine sent to the pharmacy. Diabetes mellitus  continue follow-up with his endocrinologist.  Also discussed with him to schedule his eye exam he is good to do this at my eye doctor. Colon cancer screening he declines colonoscopy states that he wants to try the Cologuard.  I gave him the number to set this up.  Prostate cancer screening PSA to be done  Hypertension blood pressure looks okay today no changes.  Hyperlipidemia recheck his lipids goal less than 100 adjust statin drug as needed.  Obesity continues to be an issue for  him continue to reiterate the types of foods that he needs to eat but this is been very difficult.        Diet review for nutrition referral? Yes ____ Not Indicated __x__  Patient Instructions (the written plan) was given to the patient.  Medicare Attestation  I have personally reviewed:  The patient's medical and social history  Their use of alcohol, tobacco or illicit drugs  Their current medications and supplements  The patient's functional ability including ADLs,fall risks, home safety risks, cognitive, and hearing and visual impairment  Diet and physical activities  Evidence for depression or mood disorders  The patient's weight, height, BMI, and visual acuity have been recorded in the chart. I have made referrals, counseling, and provided education to the patient based on review of the above and I have provided the patient with a written personalized care plan for preventive services.

## 2018-03-28 ENCOUNTER — Encounter: Payer: Self-pay | Admitting: Family Medicine

## 2018-03-28 ENCOUNTER — Other Ambulatory Visit: Payer: Self-pay | Admitting: *Deleted

## 2018-03-28 MED ORDER — ATORVASTATIN CALCIUM 80 MG PO TABS: 80.0000 mg | ORAL_TABLET | Freq: Every day | ORAL | 3 refills | Status: DC

## 2018-04-12 ENCOUNTER — Encounter: Payer: Self-pay | Admitting: Family Medicine

## 2018-05-01 ENCOUNTER — Encounter: Payer: Self-pay | Admitting: Endocrinology

## 2018-05-01 ENCOUNTER — Ambulatory Visit (INDEPENDENT_AMBULATORY_CARE_PROVIDER_SITE_OTHER): Payer: Medicare Other | Admitting: Endocrinology

## 2018-05-01 ENCOUNTER — Encounter: Payer: Medicaid Other | Admitting: Endocrinology

## 2018-05-01 VITALS — BP 136/78 | HR 96 | Wt 243.8 lb

## 2018-05-01 DIAGNOSIS — E0840 Diabetes mellitus due to underlying condition with diabetic neuropathy, unspecified: Secondary | ICD-10-CM

## 2018-05-01 DIAGNOSIS — Z794 Long term (current) use of insulin: Secondary | ICD-10-CM | POA: Diagnosis not present

## 2018-05-01 LAB — POCT GLYCOSYLATED HEMOGLOBIN (HGB A1C): Hemoglobin A1C: 8.9 % — AB (ref 4.0–5.6)

## 2018-05-01 MED ORDER — INSULIN GLARGINE 300 UNIT/ML ~~LOC~~ SOPN
100.0000 [IU] | PEN_INJECTOR | SUBCUTANEOUS | 11 refills | Status: DC
Start: 2018-05-01 — End: 2018-07-02

## 2018-05-01 NOTE — Patient Instructions (Addendum)
check your blood sugar twice a day.  vary the time of day when you check, between before the 3 meals, and at bedtime.  also check if you have symptoms of your blood sugar being too high or too low.  please keep a record of the readings and bring it to your next appointment here (or you can bring the meter itself).  You can write it on any piece of paper.  please call us sooner if your blood sugar goes below 70, or if you have a lot of readings over 200.  Please increase the toujeo to 100 units each morning.   Please come back for a follow-up appointment in 2 months.

## 2018-05-01 NOTE — Progress Notes (Signed)
Subjective:    Patient ID: Kevin [name], male    DOB: 1956/08/14, 62 y.o.   MRN: [id number]  HPI Pt returns for f/u of diabetes mellitus: DM type: Insulin-requiring type 2. Dx'ed: 4854 Complications: polyneuropathy Therapy: insulin since 2001, and metformin DKA: never Severe hypoglycemia: never Pancreatitis: never Pancreatic imaging: never Other: he is not a candidate for multiple daily injections, due to h/o noncompliance Pt reports symptoms: none today. Pt takes meds as rx'ed. Since the insulin was increased, cbg's are in the low-100's.  There is no trend throughout the day.  pt states he feels well in general.  Pt says he never misses the insulin.   Past Medical History:  Diagnosis Date  . Arthritis   . Chronic knee pain   . Depression   . Diabetes mellitus ([place])   . Hyperlipidemia   . Hypertension   . Leg edema   . Obesity     Past Surgical History:  Procedure Laterality Date  . none      Social History   Socioeconomic History  . Marital status: Single    Spouse name: Not on file  . Number of children: 0  . Years of education: Not on file  . Highest education level: Not on file  Occupational History  . Not on file  Social Needs  . Financial resource strain: Not on file  . Food insecurity:    Worry: Not on file    Inability: Not on file  . Transportation needs:    Medical: Not on file    Non-medical: Not on file  Tobacco Use  . Smoking status: Current Every Day Smoker    Packs/day: 0.20  . Smokeless tobacco: Never Used  Substance and Sexual Activity  . Alcohol use: No  . Drug use: No  . Sexual activity: Not Currently  Lifestyle  . Physical activity:    Days per week: Not on file    Minutes per session: Not on file  . Stress: Not on file  Relationships  . Social connections:    Talks on phone: Not on file    Gets together: Not on file    Attends religious service: Not on file    Active member of club or organization: Not on file   Attends meetings of clubs or organizations: Not on file    Relationship status: Not on file  . Intimate partner violence:    Fear of current or ex partner: Not on file    Emotionally abused: Not on file    Physically abused: Not on file    Forced sexual activity: Not on file  Other Topics Concern  . Not on file  Social History Narrative   Patient's POA is his sister, [name] [phone].            Current Outpatient Medications on File Prior to Visit  Medication Sig Dispense Refill  . amLODipine (NORVASC) 5 MG tablet Take 1 tablet (5 mg total) by mouth daily. For blood pressure 90 tablet 1  . aspirin EC 81 MG tablet Take 81 mg by mouth daily.    [name] atorvastatin (LIPITOR) 80 MG tablet Take 1 tablet (80 mg total) by mouth daily. 90 tablet 3  . clotrimazole (LOTRIMIN) 1 % cream Apply 1 application topically 2 (two) times daily. 30 g 1  . diclofenac (VOLTAREN) 75 MG EC tablet TAKE (1) TABLET BY MOUTH TWICE DAILY FOR INFLAMMATION. 60 tablet 11  . diclofenac sodium (VOLTAREN) 1 % GEL  APPLY TO THE KNEE THREE TIMES DAILY AS NEEDED. 100 g 0  . FLUoxetine (PROZAC) 20 MG capsule Take 1 capsule (20 mg total) by mouth daily. For your nerves 90 capsule 3  . folic acid (FOLVITE) 1 MG tablet Take 1 tablet (1 mg total) by mouth daily. 90 tablet 3  . furosemide (LASIX) 40 MG tablet Take 1 tablet (40 mg total) by mouth 2 (two) times daily. For fluid 180 tablet 3  . gabapentin (NEURONTIN) 100 MG capsule Take 1-2 capsules at bedtime FOR neuropathy and pain 60 capsule 3  . Glucose Blood (BLOOD GLUCOSE TEST STRIPS) STRP Use to monitor FSBS 3x daily. Dx: E11.65. 100 each 3  . metFORMIN (GLUCOPHAGE) 1000 MG tablet Take 1 tablet (1,000 mg total) by mouth 2 (two) times daily with a meal. For diabetes 180 tablet 3  . nitroGLYCERIN (NITROSTAT) 0.4 MG SL tablet PLACE 1 TAB UNDER TONGUE EVERY 5 MIN IF NEEDED FOR CHEST PAIN. MAY USE 3 TIMES.NO RELIEF CALL 911. 25 tablet 0  . potassium chloride (K-DUR) 10 MEQ tablet  Take 1 tablet (10 mEq total) by mouth 2 (two) times daily. Take with fluid pill 180 tablet 3   No current facility-administered medications on file prior to visit.     Allergies  Allergen Reactions  . Lisinopril Cough    Family History  Problem Relation Age of Onset  . Heart disease Unknown   . Arthritis Unknown   . Diabetes Unknown   . Cancer Mother        deceased    BP 136/78 (BP Location: Left Arm, Patient Position: Sitting, Cuff Size: Normal)   Pulse 96   Wt 243 lb 12.8 oz (110.6 kg)   SpO2 97%   BMI 37.07 kg/m    Review of Systems He denies hypoglycemia.      Objective:   Physical Exam VITAL SIGNS:  See vs page.  GENERAL: no distress.  Pulses: dorsalis pedis intact bilat.   MSK: no deformity of the feet.  CV: 2+ bilat leg edema, and bilat vv's.   Skin:  At the dorsal aspect of the left foot, there is slight tend/swell/erythema/warmth, with a shallow ulcer.  normal color and temp on the feet.   Neuro: sensation is intact to touch on the feet, but decreased from normal.  Ext: There is bilateral onychomycosis of the toenails.  Lab Results  Component Value Date   HGBA1C 8.9 (A) 05/01/2018   Lab Results  Component Value Date   CREATININE 0.66 (L) 03/27/2018   BUN 9 03/27/2018   NA 135 03/27/2018   K 4.1 03/27/2018   CL 100 03/27/2018   CO2 27 03/27/2018      Assessment & Plan:  Insulin-requiring type 2 DM, with polyneuropathy: he needs increased rx.     Patient Instructions  check your blood sugar twice a day.  vary the time of day when you check, between before the 3 meals, and at bedtime.  also check if you have symptoms of your blood sugar being too high or too low.  please keep a record of the readings and bring it to your next appointment here (or you can bring the meter itself).  You can write it on any piece of paper.  please call us sooner if your blood sugar goes below 70, or if you have a lot of readings over 200.  Please increase the toujeo to  100 units each morning.   Please come back for a follow-up appointment  in 2 months.

## 2018-05-02 NOTE — Progress Notes (Signed)
This encounter was created in error - please disregard.

## 2018-05-14 ENCOUNTER — Other Ambulatory Visit: Payer: Self-pay | Admitting: Family Medicine

## 2018-05-24 ENCOUNTER — Other Ambulatory Visit: Payer: Self-pay | Admitting: Family Medicine

## 2018-05-31 DIAGNOSIS — E1142 Type 2 diabetes mellitus with diabetic polyneuropathy: Secondary | ICD-10-CM | POA: Diagnosis not present

## 2018-05-31 DIAGNOSIS — B351 Tinea unguium: Secondary | ICD-10-CM | POA: Diagnosis not present

## 2018-05-31 DIAGNOSIS — L84 Corns and callosities: Secondary | ICD-10-CM | POA: Diagnosis not present

## 2018-05-31 DIAGNOSIS — M79676 Pain in unspecified toe(s): Secondary | ICD-10-CM | POA: Diagnosis not present

## 2018-06-02 ENCOUNTER — Encounter: Payer: Self-pay | Admitting: Family Medicine

## 2018-06-28 ENCOUNTER — Other Ambulatory Visit: Payer: Self-pay

## 2018-06-28 NOTE — Patient Outreach (Signed)
[facility]) Care Management  06/28/2018  THATCHER DOBERSTEIN Dec 15, 1955 [id number]   Medication Adherence call to  Mr. Sylvan Sookdeo spoke with patient he ask if we can call [facility] for more refill, pharmacy said patient is now taking Atorvastatin 80 mg insread of Atorvastatin 40 mg. Mr. Cohick is showing past due under [facility].  [facility] Management Direct Dial [phone]  Fax [phone] [email or web address]

## 2018-07-02 ENCOUNTER — Ambulatory Visit (INDEPENDENT_AMBULATORY_CARE_PROVIDER_SITE_OTHER): Payer: Medicare Other | Admitting: Endocrinology

## 2018-07-02 ENCOUNTER — Encounter: Payer: Self-pay | Admitting: Endocrinology

## 2018-07-02 VITALS — BP 136/80 | HR 79 | Ht 68.0 in | Wt 246.0 lb

## 2018-07-02 DIAGNOSIS — Z794 Long term (current) use of insulin: Secondary | ICD-10-CM | POA: Diagnosis not present

## 2018-07-02 DIAGNOSIS — E0840 Diabetes mellitus due to underlying condition with diabetic neuropathy, unspecified: Secondary | ICD-10-CM

## 2018-07-02 LAB — POCT GLYCOSYLATED HEMOGLOBIN (HGB A1C): Hemoglobin A1C: 9.1 % — AB (ref 4.0–5.6)

## 2018-07-02 MED ORDER — INSULIN GLARGINE 300 UNIT/ML ~~LOC~~ SOPN: 120.0000 [IU] | PEN_INJECTOR | SUBCUTANEOUS | 11 refills | Status: DC

## 2018-07-02 NOTE — Patient Instructions (Addendum)
check your blood sugar twice a day.  vary the time of day when you check, between before the 3 meals, and at bedtime.  also check if you have symptoms of your blood sugar being too high or too low.  please keep a record of the readings and bring it to your next appointment here (or you can bring the meter itself).  You can write it on any piece of paper.  please call us sooner if your blood sugar goes below 70, or if you have a lot of readings over 200.  Please increase the toujeo to 120 units each morning.   Please come back for a follow-up appointment in 2 months.

## 2018-07-02 NOTE — Progress Notes (Signed)
Subjective:    Patient ID: Kevin [name], male    DOB: November 10, 1955, 62 y.o.   MRN: [id number]  HPI Pt returns for f/u of diabetes mellitus: DM type: Insulin-requiring type 2. Dx'ed: 9735 Complications: polyneuropathy Therapy: insulin since 2001, and metformin DKA: never Severe hypoglycemia: never Pancreatitis: never Pancreatic imaging: never Other: he is not a candidate for multiple daily injections, due to h/o noncompliance.   Pt takes meds as rx'ed. Since the insulin was increased, cbg's are still in the low-100's.  There is no trend throughout the day.  pt states he feels well in general.  Pt says he never misses the insulin.   Past Medical History:  Diagnosis Date  . Arthritis   . Chronic knee pain   . Depression   . Diabetes mellitus ([place])   . Hyperlipidemia   . Hypertension   . Leg edema   . Obesity     Past Surgical History:  Procedure Laterality Date  . none      Social History   Socioeconomic History  . Marital status: Single    Spouse name: Not on file  . Number of children: 0  . Years of education: Not on file  . Highest education level: Not on file  Occupational History  . Not on file  Social Needs  . Financial resource strain: Not on file  . Food insecurity:    Worry: Not on file    Inability: Not on file  . Transportation needs:    Medical: Not on file    Non-medical: Not on file  Tobacco Use  . Smoking status: Current Every Day Smoker    Packs/day: 0.20  . Smokeless tobacco: Never Used  Substance and Sexual Activity  . Alcohol use: No  . Drug use: No  . Sexual activity: Not Currently  Lifestyle  . Physical activity:    Days per week: Not on file    Minutes per session: Not on file  . Stress: Not on file  Relationships  . Social connections:    Talks on phone: Not on file    Gets together: Not on file    Attends religious service: Not on file    Active member of club or organization: Not on file    Attends meetings of clubs or  organizations: Not on file    Relationship status: Not on file  . Intimate partner violence:    Fear of current or ex partner: Not on file    Emotionally abused: Not on file    Physically abused: Not on file    Forced sexual activity: Not on file  Other Topics Concern  . Not on file  Social History Narrative   Patient's POA is his sister, [name] [phone].            Current Outpatient Medications on File Prior to Visit  Medication Sig Dispense Refill  . amLODipine (NORVASC) 5 MG tablet Take 1 tablet (5 mg total) by mouth daily. For blood pressure 90 tablet 1  . aspirin EC 81 MG tablet Take 81 mg by mouth daily.    [name] atorvastatin (LIPITOR) 80 MG tablet Take 1 tablet (80 mg total) by mouth daily. 90 tablet 3  . clotrimazole (LOTRIMIN) 1 % cream Apply 1 application topically 2 (two) times daily. 30 g 1  . diclofenac (VOLTAREN) 75 MG EC tablet TAKE (1) TABLET BY MOUTH TWICE DAILY FOR INFLAMMATION. 60 tablet 11  . diclofenac sodium (VOLTAREN) 1 % GEL  APPLY TO THE KNEE THREE TIMES DAILY AS NEEDED. 100 g 0  . FLUoxetine (PROZAC) 20 MG capsule Take 1 capsule (20 mg total) by mouth daily. For your nerves 90 capsule 3  . folic acid (FOLVITE) 1 MG tablet Take 1 tablet (1 mg total) by mouth daily. 90 tablet 3  . gabapentin (NEURONTIN) 100 MG capsule Take 1-2 capsules at bedtime FOR neuropathy and pain 60 capsule 3  . Glucose Blood (BLOOD GLUCOSE TEST STRIPS) STRP Use to monitor FSBS 3x daily. Dx: E11.65. 100 each 3  . metFORMIN (GLUCOPHAGE) 1000 MG tablet TAKE 1 TABLET BY MOUTH TWICE DAILY WITH A MEAL FOR DIABETES. 60 tablet 0  . nitroGLYCERIN (NITROSTAT) 0.4 MG SL tablet DISSOLVE 1 TABLET UNDER TONGUE EVERY 5 MINUTES UP TO 15 MIN FOR CHESTPAIN. IF NO RELIEF CALL 911. 25 tablet 0  . potassium chloride (K-DUR) 10 MEQ tablet Take 1 tablet (10 mEq total) by mouth 2 (two) times daily. Take with fluid pill 180 tablet 3  . furosemide (LASIX) 40 MG tablet Take 1 tablet (40 mg total) by mouth 2 (two)  times daily. For fluid 180 tablet 3   No current facility-administered medications on file prior to visit.     Allergies  Allergen Reactions  . Lisinopril Cough    Family History  Problem Relation Age of Onset  . Heart disease Unknown   . Arthritis Unknown   . Diabetes Unknown   . Cancer Mother        deceased    BP 136/80 (BP Location: Right Arm)   Pulse 79   Ht 5\' 8"  (1.727 m)   Wt 246 lb (111.6 kg)   SpO2 96%   BMI 37.40 kg/m    Review of Systems He denies hypoglycemia.      Objective:   Physical Exam VITAL SIGNS:  See vs page.  GENERAL: no distress.  Pulses: dorsalis pedis intact bilat.   MSK: no deformity of the feet.  CV: 1+ bilat leg edema, and bilat vv's.   Skin:  No foot ulcer.  normal color and temp on the feet.   Neuro: sensation is intact to touch on the feet, but decreased from normal.  Ext: There is bilateral onychomycosis of the toenails.    A1c=9.1%    Assessment & Plan:  Insulin-requiring type 2 DM, with polyneuropathy. He needs increased rx.   Patient Instructions  check your blood sugar twice a day.  vary the time of day when you check, between before the 3 meals, and at bedtime.  also check if you have symptoms of your blood sugar being too high or too low.  please keep a record of the readings and bring it to your next appointment here (or you can bring the meter itself).  You can write it on any piece of paper.  please call us sooner if your blood sugar goes below 70, or if you have a lot of readings over 200.  Please increase the toujeo to 120 units each morning.   Please come back for a follow-up appointment in 2 months.

## 2018-07-10 LAB — CBC WITH DIFFERENTIAL/PLATELET
Basophils Absolute: 119 cells/uL (ref 0–200)
Basophils Relative: 1.1 %
Eosinophils Absolute: 475 cells/uL (ref 15–500)
Eosinophils Relative: 4.4 %
HCT: 44.6 % (ref 38.5–50.0)
Hemoglobin: 16.2 g/dL (ref 13.2–17.1)
Lymphs Abs: 3035 cells/uL (ref 850–3900)
MCH: 32.6 pg (ref 27.0–33.0)
MCHC: 36.3 g/dL — ABNORMAL HIGH (ref 32.0–36.0)
MCV: 89.7 fL (ref 80.0–100.0)
MPV: 10.2 fL (ref 7.5–12.5)
Monocytes Relative: 8.2 %
Neutro Abs: 6286 cells/uL (ref 1500–7800)
Neutrophils Relative %: 58.2 %
Platelets: 212 10*3/uL (ref 140–400)
RBC: 4.97 10*6/uL (ref 4.20–5.80)
RDW: 11.3 % (ref 11.0–15.0)
Total Lymphocyte: 28.1 %
WBC mixed population: 886 cells/uL (ref 200–950)
WBC: 10.8 10*3/uL (ref 3.8–10.8)

## 2018-07-10 LAB — COMPREHENSIVE METABOLIC PANEL
AG Ratio: 1.3 (calc) (ref 1.0–2.5)
ALT: 26 U/L (ref 9–46)
AST: 24 U/L (ref 10–35)
Albumin: 4.1 g/dL (ref 3.6–5.1)
Alkaline phosphatase (APISO): 69 U/L (ref 40–115)
BUN/Creatinine Ratio: 14 (calc) (ref 6–22)
BUN: 9 mg/dL (ref 7–25)
CO2: 27 mmol/L (ref 20–32)
Calcium: 9.2 mg/dL (ref 8.6–10.3)
Chloride: 100 mmol/L (ref 98–110)
Creat: 0.66 mg/dL — ABNORMAL LOW (ref 0.70–1.25)
Globulin: 3.1 g/dL (calc) (ref 1.9–3.7)
Glucose, Bld: 163 mg/dL — ABNORMAL HIGH (ref 65–99)
Potassium: 4.1 mmol/L (ref 3.5–5.3)
Sodium: 135 mmol/L (ref 135–146)
Total Bilirubin: 0.4 mg/dL (ref 0.2–1.2)
Total Protein: 7.2 g/dL (ref 6.1–8.1)

## 2018-07-10 LAB — LIPID PANEL
Cholesterol: 194 mg/dL (ref <200)
HDL: 37 mg/dL — ABNORMAL LOW (ref >40)
LDL Cholesterol (Calc): 121 mg/dL (calc) — ABNORMAL HIGH
Non-HDL Cholesterol (Calc): 157 mg/dL (calc) — ABNORMAL HIGH (ref <130)
Total CHOL/HDL Ratio: 5.2 (calc) — ABNORMAL HIGH (ref <5.0)
Triglycerides: 240 mg/dL — ABNORMAL HIGH (ref <150)

## 2018-07-10 LAB — PSA: PSA: 0.3 ng/mL (ref <=4.0)

## 2018-07-27 ENCOUNTER — Ambulatory Visit (INDEPENDENT_AMBULATORY_CARE_PROVIDER_SITE_OTHER): Payer: Medicare Other | Admitting: Family Medicine

## 2018-07-27 VITALS — BP 120/72 | HR 76 | Temp 98.4°F | Resp 15 | Ht 68.0 in | Wt 248.0 lb

## 2018-07-27 DIAGNOSIS — I10 Essential (primary) hypertension: Secondary | ICD-10-CM

## 2018-07-27 DIAGNOSIS — E7800 Pure hypercholesterolemia, unspecified: Secondary | ICD-10-CM

## 2018-07-27 DIAGNOSIS — Z23 Encounter for immunization: Secondary | ICD-10-CM | POA: Diagnosis not present

## 2018-07-27 DIAGNOSIS — L989 Disorder of the skin and subcutaneous tissue, unspecified: Secondary | ICD-10-CM

## 2018-07-27 DIAGNOSIS — C4492 Squamous cell carcinoma of skin, unspecified: Secondary | ICD-10-CM | POA: Diagnosis not present

## 2018-07-27 MED ORDER — PROPRANOLOL HCL ER 60 MG PO CP24: 60.0000 mg | ORAL_CAPSULE | Freq: Every day | ORAL | 1 refills | Status: DC

## 2018-07-27 NOTE — Progress Notes (Signed)
Subjective:    Patient ID: Kevin [name], male    DOB: [birth year], 62 y.o.   MRN: [id number]  HPI Patient is here today for follow-up.  He sees endocrinology to manage his diabetes.  However he is usually followed here to manage his blood pressure and his cholesterol.  His blood pressure today is well controlled at 120/72.  He denies any chest pain shortness of breath or dyspnea on exertion.  He is due to check his cholesterol.  He is currently on atorvastatin.  He denies any myalgias or right upper quadrant pain.  He does report a tremor in his left extremity.  He has a shaking high-frequency tremor in his left extremity that occurs with activity.  The more he focuses the worst the tremor gets.  At rest, the tremor is not present.  This is possibly an essential tremor made worse by left arm weakness.[name]  He would like to try medication to calm the tremor.  He states it is hard for him to eat due to the shaking tremor.  As I was examining his left arm, there is also a concerning lesion on the dorsum of his left forearm.  It is 7 mm in diameter.  Is a pink erythematous papule with hard white scale and several excoriations.  Differential diagnosis includes malignancy of the skin versus a pickers nodule.  I have recommended a shave biopsy of this. Past Medical History:  Diagnosis Date  . Arthritis   . Chronic knee pain   . Depression   . Diabetes mellitus ([place])   . Hyperlipidemia   . Hypertension   . Leg edema   . Obesity    Past Surgical History:  Procedure Laterality Date  . none     Current Outpatient Medications on File Prior to Visit  Medication Sig Dispense Refill  . amLODipine (NORVASC) 5 MG tablet Take 1 tablet (5 mg total) by mouth daily. For blood pressure 90 tablet 1  . aspirin EC 81 MG tablet Take 81 mg by mouth daily.    [name] atorvastatin (LIPITOR) 80 MG tablet Take 1 tablet (80 mg total) by mouth daily. 90 tablet 3  . clotrimazole (LOTRIMIN) 1 % cream Apply 1 application  topically 2 (two) times daily. 30 g 1  . diclofenac (VOLTAREN) 75 MG EC tablet TAKE (1) TABLET BY MOUTH TWICE DAILY FOR INFLAMMATION. 60 tablet 11  . diclofenac sodium (VOLTAREN) 1 % GEL APPLY TO THE KNEE THREE TIMES DAILY AS NEEDED. 100 g 0  . FLUoxetine (PROZAC) 20 MG capsule Take 1 capsule (20 mg total) by mouth daily. For your nerves 90 capsule 3  . folic acid (FOLVITE) 1 MG tablet Take 1 tablet (1 mg total) by mouth daily. 90 tablet 3  . gabapentin (NEURONTIN) 100 MG capsule Take 1-2 capsules at bedtime FOR neuropathy and pain 60 capsule 3  . Glucose Blood (BLOOD GLUCOSE TEST STRIPS) STRP Use to monitor FSBS 3x daily. Dx: E11.65. 100 each 3  . Insulin Glargine (TOUJEO SOLOSTAR) 300 UNIT/ML SOPN Inject 120 Units into the skin every morning. 6 pen 11  . metFORMIN (GLUCOPHAGE) 1000 MG tablet TAKE 1 TABLET BY MOUTH TWICE DAILY WITH A MEAL FOR DIABETES. 60 tablet 0  . nitroGLYCERIN (NITROSTAT) 0.4 MG SL tablet DISSOLVE 1 TABLET UNDER TONGUE EVERY 5 MINUTES UP TO 15 MIN FOR CHESTPAIN. IF NO RELIEF CALL 911. 25 tablet 0  . potassium chloride (K-DUR) 10 MEQ tablet Take 1 tablet (10 mEq total) by mouth  2 (two) times daily. Take with fluid pill 180 tablet 3  . furosemide (LASIX) 40 MG tablet Take 1 tablet (40 mg total) by mouth 2 (two) times daily. For fluid 180 tablet 3   No current facility-administered medications on file prior to visit.    Allergies  Allergen Reactions  . Lisinopril Cough   Social History   Socioeconomic History  . Marital status: Single    Spouse name: Not on file  . Number of children: 0  . Years of education: Not on file  . Highest education level: Not on file  Occupational History  . Not on file  Social Needs  . Financial resource strain: Not on file  . Food insecurity:    Worry: Not on file    Inability: Not on file  . Transportation needs:    Medical: Not on file    Non-medical: Not on file  Tobacco Use  . Smoking status: Current Every Day Smoker     Packs/day: 0.20  . Smokeless tobacco: Never Used  Substance and Sexual Activity  . Alcohol use: No  . Drug use: No  . Sexual activity: Not Currently  Lifestyle  . Physical activity:    Days per week: Not on file    Minutes per session: Not on file  . Stress: Not on file  Relationships  . Social connections:    Talks on phone: Not on file    Gets together: Not on file    Attends religious service: Not on file    Active member of club or organization: Not on file    Attends meetings of clubs or organizations: Not on file    Relationship status: Not on file  . Intimate partner violence:    Fear of current or ex partner: Not on file    Emotionally abused: Not on file    Physically abused: Not on file    Forced sexual activity: Not on file  Other Topics Concern  . Not on file  Social History Narrative   Patient's POA is his sister, [name] [phone].              Review of Systems  All other systems reviewed and are negative.      Objective:   Physical Exam  Cardiovascular: Normal rate, regular rhythm and normal heart sounds.  Pulmonary/Chest: Effort normal and breath sounds normal. No stridor. No respiratory distress. He has no wheezes. He has no rales.  Abdominal: Soft. Bowel sounds are normal. He exhibits no distension and no mass. There is no tenderness. There is no rebound and no guarding.  Skin: Lesion and rash noted. Rash is nodular.     Vitals reviewed.         Assessment & Plan:  Pure hypercholesterolemia - Plan: CBC with Differential/Platelet, COMPLETE METABOLIC PANEL WITH GFR, LDL Cholesterol, Direct  Skin lesion of left arm - Plan: Pathology  Need for immunization against influenza - Plan: Flu Vaccine QUAD 36+ mos IM  Essential hypertension, benign  I will check a CBC along with a direct LDL cholesterol.  I will also check a CMP.  I am concerned due to transportation issues that the patient would not be able to come back for fasting lipid panel  and I want to ensure that his LDL cholesterol is less than 100 and given his diabetes.  His blood pressure today is well controlled at 120/72.  Patient received a flu shot today.  I am concerned the lesion on  his forearm may represent a skin cancer.  I anesthetized lesion with 0.1% lidocaine with epinephrine and performed a shave biopsy of the lesion and sent it to pathology in a labeled container.  Patient tolerated the procedure well without complication.  Hemostasis was achieved with Drysol and a Band-Aid.  Await the results of the skin biopsy.  I will try treating the tremor in his left hand with Inderal LA 60 mg p.o. daily.  Monitor blood pressure closely with this.  Monitor for bradycardia.

## 2018-07-28 LAB — COMPLETE METABOLIC PANEL WITH GFR
AG Ratio: 1.3 (calc) (ref 1.0–2.5)
ALT: 26 U/L (ref 9–46)
AST: 22 U/L (ref 10–35)
Albumin: 4 g/dL (ref 3.6–5.1)
Alkaline phosphatase (APISO): 93 U/L (ref 40–115)
BUN: 10 mg/dL (ref 7–25)
CO2: 23 mmol/L (ref 20–32)
Calcium: 8.7 mg/dL (ref 8.6–10.3)
Chloride: 100 mmol/L (ref 98–110)
Creat: 0.7 mg/dL (ref 0.70–1.25)
GFR, Est African American: 119 mL/min/{1.73_m2} (ref >=60)
GFR, Est Non African American: 103 mL/min/{1.73_m2} (ref >=60)
Globulin: 3.2 g/dL (calc) (ref 1.9–3.7)
Glucose, Bld: 248 mg/dL — ABNORMAL HIGH (ref 65–99)
Potassium: 4.2 mmol/L (ref 3.5–5.3)
Sodium: 137 mmol/L (ref 135–146)
Total Bilirubin: 0.7 mg/dL (ref 0.2–1.2)
Total Protein: 7.2 g/dL (ref 6.1–8.1)

## 2018-07-28 LAB — CBC WITH DIFFERENTIAL/PLATELET
Basophils Absolute: 127 cells/uL (ref 0–200)
Basophils Relative: 1.2 %
Eosinophils Absolute: 382 cells/uL (ref 15–500)
Eosinophils Relative: 3.6 %
HCT: 44.6 % (ref 38.5–50.0)
Hemoglobin: 15.7 g/dL (ref 13.2–17.1)
Lymphs Abs: 3233 cells/uL (ref 850–3900)
MCH: 32.5 pg (ref 27.0–33.0)
MCHC: 35.2 g/dL (ref 32.0–36.0)
MCV: 92.3 fL (ref 80.0–100.0)
MPV: 11.2 fL (ref 7.5–12.5)
Monocytes Relative: 6.8 %
Neutro Abs: 6137 cells/uL (ref 1500–7800)
Neutrophils Relative %: 57.9 %
Platelets: 210 10*3/uL (ref 140–400)
RBC: 4.83 10*6/uL (ref 4.20–5.80)
RDW: 10.9 % — ABNORMAL LOW (ref 11.0–15.0)
Total Lymphocyte: 30.5 %
WBC mixed population: 721 cells/uL (ref 200–950)
WBC: 10.6 10*3/uL (ref 3.8–10.8)

## 2018-07-28 LAB — LDL CHOLESTEROL, DIRECT: Direct LDL: 57 mg/dL (ref <100)

## 2018-07-30 ENCOUNTER — Encounter: Payer: Self-pay | Admitting: *Deleted

## 2018-07-31 LAB — TISSUE SPECIMEN

## 2018-07-31 LAB — PATHOLOGY

## 2018-08-16 DIAGNOSIS — B351 Tinea unguium: Secondary | ICD-10-CM | POA: Diagnosis not present

## 2018-08-16 DIAGNOSIS — L84 Corns and callosities: Secondary | ICD-10-CM | POA: Diagnosis not present

## 2018-08-16 DIAGNOSIS — M79676 Pain in unspecified toe(s): Secondary | ICD-10-CM | POA: Diagnosis not present

## 2018-08-16 DIAGNOSIS — E1142 Type 2 diabetes mellitus with diabetic polyneuropathy: Secondary | ICD-10-CM | POA: Diagnosis not present

## 2018-08-24 ENCOUNTER — Ambulatory Visit (INDEPENDENT_AMBULATORY_CARE_PROVIDER_SITE_OTHER): Payer: Medicare Other | Admitting: Family Medicine

## 2018-08-24 VITALS — BP 110/66 | HR 62 | Temp 98.3°F | Resp 16 | Ht 68.0 in | Wt 248.0 lb

## 2018-08-24 DIAGNOSIS — C44629 Squamous cell carcinoma of skin of left upper limb, including shoulder: Secondary | ICD-10-CM

## 2018-08-24 DIAGNOSIS — C4492 Squamous cell carcinoma of skin, unspecified: Secondary | ICD-10-CM | POA: Diagnosis not present

## 2018-08-24 NOTE — Progress Notes (Signed)
Subjective:    Patient ID: Kevin [name], male    DOB: 23-Jun-1956, 62 y.o.   MRN: [id number]  HPI  07/27/18 Patient is here today for follow-up.  He sees endocrinology to manage his diabetes.  However he is usually followed here to manage his blood pressure and his cholesterol.  His blood pressure today is well controlled at 120/72.  He denies any chest pain shortness of breath or dyspnea on exertion.  He is due to check his cholesterol.  He is currently on atorvastatin.  He denies any myalgias or right upper quadrant pain.  He does report a tremor in his left extremity.  He has a shaking high-frequency tremor in his left extremity that occurs with activity.  The more he focuses the worst the tremor gets.  At rest, the tremor is not present.  This is possibly an essential tremor made worse by left arm weakness.[name]  He would like to try medication to calm the tremor.  He states it is hard for him to eat due to the shaking tremor.  As I was examining his left arm, there is also a concerning lesion on the dorsum of his left forearm.  It is 7 mm in diameter.  Is a pink erythematous papule with hard white scale and several excoriations.  Differential diagnosis includes malignancy of the skin versus a pickers nodule.  I have recommended a shave biopsy of this.  At that time, my plan was: I will check a CBC along with a direct LDL cholesterol.  I will also check a CMP.  I am concerned due to transportation issues that the patient would not be able to come back for fasting lipid panel and I want to ensure that his LDL cholesterol is less than 100 and given his diabetes.  His blood pressure today is well controlled at 120/72.  Patient received a flu shot today.  I am concerned the lesion on his forearm may represent a skin cancer.  I anesthetized lesion with 0.1% lidocaine with epinephrine and performed a shave biopsy of the lesion and sent it to pathology in a labeled container.  Patient tolerated the procedure  well without complication.  Hemostasis was achieved with Drysol and a Band-Aid.  Await the results of the skin biopsy.  I will try treating the tremor in his left hand with Inderal LA 60 mg p.o. daily.  Monitor blood pressure closely with this.  Monitor for bradycardia.  08/24/18 Biopsy revealed invasive squamous cell carcinoma.  Margins were involved.  Patient was advised to return for wider excision.  He is here today for that. Past Medical History:  Diagnosis Date  . Arthritis   . Chronic knee pain   . Depression   . Diabetes mellitus ([place])   . Hyperlipidemia   . Hypertension   . Leg edema   . Obesity    Past Surgical History:  Procedure Laterality Date  . none     Current Outpatient Medications on File Prior to Visit  Medication Sig Dispense Refill  . amLODipine (NORVASC) 5 MG tablet Take 1 tablet (5 mg total) by mouth daily. For blood pressure 90 tablet 1  . aspirin EC 81 MG tablet Take 81 mg by mouth daily.    [name] atorvastatin (LIPITOR) 80 MG tablet Take 1 tablet (80 mg total) by mouth daily. 90 tablet 3  . clotrimazole (LOTRIMIN) 1 % cream Apply 1 application topically 2 (two) times daily. 30 g 1  . diclofenac (VOLTAREN)  75 MG EC tablet TAKE (1) TABLET BY MOUTH TWICE DAILY FOR INFLAMMATION. 60 tablet 11  . diclofenac sodium (VOLTAREN) 1 % GEL APPLY TO THE KNEE THREE TIMES DAILY AS NEEDED. 100 g 0  . FLUoxetine (PROZAC) 20 MG capsule Take 1 capsule (20 mg total) by mouth daily. For your nerves 90 capsule 3  . folic acid (FOLVITE) 1 MG tablet Take 1 tablet (1 mg total) by mouth daily. 90 tablet 3  . gabapentin (NEURONTIN) 100 MG capsule Take 1-2 capsules at bedtime FOR neuropathy and pain 60 capsule 3  . Glucose Blood (BLOOD GLUCOSE TEST STRIPS) STRP Use to monitor FSBS 3x daily. Dx: E11.65. 100 each 3  . Insulin Glargine (TOUJEO SOLOSTAR) 300 UNIT/ML SOPN Inject 120 Units into the skin every morning. 6 pen 11  . metFORMIN (GLUCOPHAGE) 1000 MG tablet TAKE 1 TABLET BY MOUTH TWICE  DAILY WITH A MEAL FOR DIABETES. 60 tablet 0  . nitroGLYCERIN (NITROSTAT) 0.4 MG SL tablet DISSOLVE 1 TABLET UNDER TONGUE EVERY 5 MINUTES UP TO 15 MIN FOR CHESTPAIN. IF NO RELIEF CALL 911. 25 tablet 0  . potassium chloride (K-DUR) 10 MEQ tablet Take 1 tablet (10 mEq total) by mouth 2 (two) times daily. Take with fluid pill 180 tablet 3  . propranolol ER (INDERAL LA) 60 MG 24 hr capsule Take 1 capsule (60 mg total) by mouth daily. 30 capsule 1  . furosemide (LASIX) 40 MG tablet Take 1 tablet (40 mg total) by mouth 2 (two) times daily. For fluid 180 tablet 3   No current facility-administered medications on file prior to visit.    Allergies  Allergen Reactions  . Lisinopril Cough   Social History   Socioeconomic History  . Marital status: Single    Spouse name: Not on file  . Number of children: 0  . Years of education: Not on file  . Highest education level: Not on file  Occupational History  . Not on file  Social Needs  . Financial resource strain: Not on file  . Food insecurity:    Worry: Not on file    Inability: Not on file  . Transportation needs:    Medical: Not on file    Non-medical: Not on file  Tobacco Use  . Smoking status: Current Every Day Smoker    Packs/day: 0.20  . Smokeless tobacco: Never Used  Substance and Sexual Activity  . Alcohol use: No  . Drug use: No  . Sexual activity: Not Currently  Lifestyle  . Physical activity:    Days per week: Not on file    Minutes per session: Not on file  . Stress: Not on file  Relationships  . Social connections:    Talks on phone: Not on file    Gets together: Not on file    Attends religious service: Not on file    Active member of club or organization: Not on file    Attends meetings of clubs or organizations: Not on file    Relationship status: Not on file  . Intimate partner violence:    Fear of current or ex partner: Not on file    Emotionally abused: Not on file    Physically abused: Not on file    Forced  sexual activity: Not on file  Other Topics Concern  . Not on file  Social History Narrative   Patient's POA is his sister, [name] [phone].  Review of Systems  All other systems reviewed and are negative.      Objective:   Physical Exam  Cardiovascular: Normal rate, regular rhythm and normal heart sounds.  Pulmonary/Chest: Effort normal and breath sounds normal. No stridor. No respiratory distress. He has no wheezes. He has no rales.  Abdominal: Soft. Bowel sounds are normal. He exhibits no distension and no mass. There is no tenderness. There is no rebound and no guarding.  Skin: Lesion and rash noted. Rash is nodular.     Vitals reviewed.     1.5 cm pink hypopigmented macule on the dorsum of the left forearm representing previous biopsy site    Assessment & Plan:  Squamous cell carcinoma, arm, left  The previous biopsy site was anesthetized with 0.1% lidocaine with epinephrine.  The patient was prepped and draped in sterile fashion.  A 2 cm x 3 cm elliptical excision was performed around the previous biopsy site down to the subcutaneous fascia.  The lesion was removed in its entirety and sent to pathology in a labeled container.  The wound was then closed with 5 simple interrupted 3-0 Ethilon sutures.  Wound care was discussed.  Minimal blood loss.  Return in 1 week for suture removal.

## 2018-08-28 LAB — PATHOLOGY

## 2018-08-28 LAB — TISSUE SPECIMEN

## 2018-08-31 ENCOUNTER — Ambulatory Visit (INDEPENDENT_AMBULATORY_CARE_PROVIDER_SITE_OTHER): Payer: Medicare Other | Admitting: Family Medicine

## 2018-08-31 DIAGNOSIS — Z4802 Encounter for removal of sutures: Secondary | ICD-10-CM

## 2018-08-31 NOTE — Progress Notes (Signed)
Subjective:    Patient ID: Kevin [name], male    DOB: 04-Oct-1956, 62 y.o.   MRN: [id number]  HPI  07/27/18 Patient is here today for follow-up.  He sees endocrinology to manage his diabetes.  However he is usually followed here to manage his blood pressure and his cholesterol.  His blood pressure today is well controlled at 120/72.  He denies any chest pain shortness of breath or dyspnea on exertion.  He is due to check his cholesterol.  He is currently on atorvastatin.  He denies any myalgias or right upper quadrant pain.  He does report a tremor in his left extremity.  He has a shaking high-frequency tremor in his left extremity that occurs with activity.  The more he focuses the worst the tremor gets.  At rest, the tremor is not present.  This is possibly an essential tremor made worse by left arm weakness.[name]  He would like to try medication to calm the tremor.  He states it is hard for him to eat due to the shaking tremor.  As I was examining his left arm, there is also a concerning lesion on the dorsum of his left forearm.  It is 7 mm in diameter.  Is a pink erythematous papule with hard white scale and several excoriations.  Differential diagnosis includes malignancy of the skin versus a pickers nodule.  I have recommended a shave biopsy of this.  At that time, my plan was: I will check a CBC along with a direct LDL cholesterol.  I will also check a CMP.  I am concerned due to transportation issues that the patient would not be able to come back for fasting lipid panel and I want to ensure that his LDL cholesterol is less than 100 and given his diabetes.  His blood pressure today is well controlled at 120/72.  Patient received a flu shot today.  I am concerned the lesion on his forearm may represent a skin cancer.  I anesthetized lesion with 0.1% lidocaine with epinephrine and performed a shave biopsy of the lesion and sent it to pathology in a labeled container.  Patient tolerated the procedure  well without complication.  Hemostasis was achieved with Drysol and a Band-Aid.  Await the results of the skin biopsy.  I will try treating the tremor in his left hand with Inderal LA 60 mg p.o. daily.  Monitor blood pressure closely with this.  Monitor for bradycardia.  08/24/18 Biopsy revealed invasive squamous cell carcinoma.  Margins were involved.  Patient was advised to return for wider excision.  He is here today for that.  At that time, my plan was:  The previous biopsy site was anesthetized with 0.1% lidocaine with epinephrine.  The patient was prepped and draped in sterile fashion.  A 2 cm x 3 cm elliptical excision was performed around the previous biopsy site down to the subcutaneous fascia.  The lesion was removed in its entirety and sent to pathology in a labeled container.  The wound was then closed with 5 simple interrupted 3-0 Ethilon sutures.  Wound care was discussed.  Minimal blood loss.  Return in 1 week for suture removal.  08/31/18 Here today for suture removal.  Biopsy confirmed clear margins.  Cancer was removed in its entirety. Past Medical History:  Diagnosis Date  . Arthritis   . Chronic knee pain   . Depression   . Diabetes mellitus ([place])   . Hyperlipidemia   . Hypertension   .  Leg edema   . Obesity    Past Surgical History:  Procedure Laterality Date  . none     Current Outpatient Medications on File Prior to Visit  Medication Sig Dispense Refill  . amLODipine (NORVASC) 5 MG tablet Take 1 tablet (5 mg total) by mouth daily. For blood pressure 90 tablet 1  . aspirin EC 81 MG tablet Take 81 mg by mouth daily.    [name] atorvastatin (LIPITOR) 80 MG tablet Take 1 tablet (80 mg total) by mouth daily. 90 tablet 3  . clotrimazole (LOTRIMIN) 1 % cream Apply 1 application topically 2 (two) times daily. 30 g 1  . diclofenac (VOLTAREN) 75 MG EC tablet TAKE (1) TABLET BY MOUTH TWICE DAILY FOR INFLAMMATION. 60 tablet 11  . diclofenac sodium (VOLTAREN) 1 % GEL APPLY TO  THE KNEE THREE TIMES DAILY AS NEEDED. 100 g 0  . FLUoxetine (PROZAC) 20 MG capsule Take 1 capsule (20 mg total) by mouth daily. For your nerves 90 capsule 3  . folic acid (FOLVITE) 1 MG tablet Take 1 tablet (1 mg total) by mouth daily. 90 tablet 3  . furosemide (LASIX) 40 MG tablet Take 1 tablet (40 mg total) by mouth 2 (two) times daily. For fluid 180 tablet 3  . gabapentin (NEURONTIN) 100 MG capsule Take 1-2 capsules at bedtime FOR neuropathy and pain 60 capsule 3  . Glucose Blood (BLOOD GLUCOSE TEST STRIPS) STRP Use to monitor FSBS 3x daily. Dx: E11.65. 100 each 3  . Insulin Glargine (TOUJEO SOLOSTAR) 300 UNIT/ML SOPN Inject 120 Units into the skin every morning. 6 pen 11  . metFORMIN (GLUCOPHAGE) 1000 MG tablet TAKE 1 TABLET BY MOUTH TWICE DAILY WITH A MEAL FOR DIABETES. 60 tablet 0  . nitroGLYCERIN (NITROSTAT) 0.4 MG SL tablet DISSOLVE 1 TABLET UNDER TONGUE EVERY 5 MINUTES UP TO 15 MIN FOR CHESTPAIN. IF NO RELIEF CALL 911. 25 tablet 0  . potassium chloride (K-DUR) 10 MEQ tablet Take 1 tablet (10 mEq total) by mouth 2 (two) times daily. Take with fluid pill 180 tablet 3  . propranolol ER (INDERAL LA) 60 MG 24 hr capsule Take 1 capsule (60 mg total) by mouth daily. 30 capsule 1   No current facility-administered medications on file prior to visit.    Allergies  Allergen Reactions  . Lisinopril Cough   Social History   Socioeconomic History  . Marital status: Single    Spouse name: Not on file  . Number of children: 0  . Years of education: Not on file  . Highest education level: Not on file  Occupational History  . Not on file  Social Needs  . Financial resource strain: Not on file  . Food insecurity:    Worry: Not on file    Inability: Not on file  . Transportation needs:    Medical: Not on file    Non-medical: Not on file  Tobacco Use  . Smoking status: Current Every Day Smoker    Packs/day: 0.20  . Smokeless tobacco: Never Used  Substance and Sexual Activity  . Alcohol  use: No  . Drug use: No  . Sexual activity: Not Currently  Lifestyle  . Physical activity:    Days per week: Not on file    Minutes per session: Not on file  . Stress: Not on file  Relationships  . Social connections:    Talks on phone: Not on file    Gets together: Not on file    Attends religious  service: Not on file    Active member of club or organization: Not on file    Attends meetings of clubs or organizations: Not on file    Relationship status: Not on file  . Intimate partner violence:    Fear of current or ex partner: Not on file    Emotionally abused: Not on file    Physically abused: Not on file    Forced sexual activity: Not on file  Other Topics Concern  . Not on file  Social History Narrative   Patient's POA is his sister, [name] [phone].              Review of Systems  All other systems reviewed and are negative.      Objective:   Physical Exam  Cardiovascular: Normal rate, regular rhythm and normal heart sounds.  Pulmonary/Chest: Effort normal and breath sounds normal. No stridor. No respiratory distress. He has no wheezes. He has no rales.  Abdominal: Soft. Bowel sounds are normal. He exhibits no distension and no mass. There is no tenderness. There is no rebound and no guarding.  Skin:     Vitals reviewed.        Assessment & Plan:  Visit for suture removal Surgical site is well-healed with no evidence of cellulitis.  Sutures were removed without difficulty.  Wound was reinforced with Steri-Strips.  Biopsy confirms clear margins.  Follow-up with PCP as planned.

## 2018-09-04 ENCOUNTER — Encounter: Payer: Self-pay | Admitting: Endocrinology

## 2018-09-04 ENCOUNTER — Ambulatory Visit (INDEPENDENT_AMBULATORY_CARE_PROVIDER_SITE_OTHER): Payer: Medicare Other | Admitting: Endocrinology

## 2018-09-04 VITALS — BP 130/72 | HR 73 | Ht 68.0 in | Wt 249.0 lb

## 2018-09-04 DIAGNOSIS — E0840 Diabetes mellitus due to underlying condition with diabetic neuropathy, unspecified: Secondary | ICD-10-CM | POA: Diagnosis not present

## 2018-09-04 DIAGNOSIS — Z794 Long term (current) use of insulin: Secondary | ICD-10-CM | POA: Diagnosis not present

## 2018-09-04 LAB — POCT GLYCOSYLATED HEMOGLOBIN (HGB A1C): Hemoglobin A1C: 10 % — AB (ref 4.0–5.6)

## 2018-09-04 MED ORDER — INSULIN GLARGINE (1 UNIT DIAL) 300 UNIT/ML ~~LOC~~ SOPN: 160.0000 [IU] | PEN_INJECTOR | SUBCUTANEOUS | 11 refills | Status: DC

## 2018-09-04 NOTE — Progress Notes (Signed)
Subjective:    Patient ID: Kevin [name], male    DOB: [birth year], 62 y.o.   MRN: [id number]  HPI Pt returns for f/u of diabetes mellitus: DM type: Insulin-requiring type 2. Dx'ed: 4098 Complications: polyneuropathy Therapy: insulin since 2001, and metformin DKA: never Severe hypoglycemia: never Pancreatitis: never Pancreatic imaging: never Other: he is not a candidate for multiple daily injections, due to h/o noncompliance.   cbg's are in the low to mid-100's fasting and in the afternoon.  There is no trend throughout the day.  pt states he feels well in general.  Pt says he never misses the insulin.  He takes 140 units qam.  He says a pen lasts 5 days.   Past Medical History:  Diagnosis Date  . Arthritis   . Chronic knee pain   . Depression   . Diabetes mellitus ([place])   . Hyperlipidemia   . Hypertension   . Leg edema   . Obesity     Past Surgical History:  Procedure Laterality Date  . none      Social History   Socioeconomic History  . Marital status: Single    Spouse name: Not on file  . Number of children: 0  . Years of education: Not on file  . Highest education level: Not on file  Occupational History  . Not on file  Social Needs  . Financial resource strain: Not on file  . Food insecurity:    Worry: Not on file    Inability: Not on file  . Transportation needs:    Medical: Not on file    Non-medical: Not on file  Tobacco Use  . Smoking status: Current Every Day Smoker    Packs/day: 0.20  . Smokeless tobacco: Never Used  Substance and Sexual Activity  . Alcohol use: No  . Drug use: No  . Sexual activity: Not Currently  Lifestyle  . Physical activity:    Days per week: Not on file    Minutes per session: Not on file  . Stress: Not on file  Relationships  . Social connections:    Talks on phone: Not on file    Gets together: Not on file    Attends religious service: Not on file    Active member of club or organization: Not on file   Attends meetings of clubs or organizations: Not on file    Relationship status: Not on file  . Intimate partner violence:    Fear of current or ex partner: Not on file    Emotionally abused: Not on file    Physically abused: Not on file    Forced sexual activity: Not on file  Other Topics Concern  . Not on file  Social History Narrative   Patient's POA is his sister, [name] [phone].            Current Outpatient Medications on File Prior to Visit  Medication Sig Dispense Refill  . amLODipine (NORVASC) 5 MG tablet Take 1 tablet (5 mg total) by mouth daily. For blood pressure 90 tablet 1  . aspirin EC 81 MG tablet Take 81 mg by mouth daily.    [name] atorvastatin (LIPITOR) 80 MG tablet Take 1 tablet (80 mg total) by mouth daily. 90 tablet 3  . clotrimazole (LOTRIMIN) 1 % cream Apply 1 application topically 2 (two) times daily. 30 g 1  . diclofenac (VOLTAREN) 75 MG EC tablet TAKE (1) TABLET BY MOUTH TWICE DAILY FOR INFLAMMATION. 60 tablet 11  .  diclofenac sodium (VOLTAREN) 1 % GEL APPLY TO THE KNEE THREE TIMES DAILY AS NEEDED. 100 g 0  . FLUoxetine (PROZAC) 20 MG capsule Take 1 capsule (20 mg total) by mouth daily. For your nerves 90 capsule 3  . folic acid (FOLVITE) 1 MG tablet Take 1 tablet (1 mg total) by mouth daily. 90 tablet 3  . gabapentin (NEURONTIN) 100 MG capsule Take 1-2 capsules at bedtime FOR neuropathy and pain 60 capsule 3  . Glucose Blood (BLOOD GLUCOSE TEST STRIPS) STRP Use to monitor FSBS 3x daily. Dx: E11.65. 100 each 3  . metFORMIN (GLUCOPHAGE) 1000 MG tablet TAKE 1 TABLET BY MOUTH TWICE DAILY WITH A MEAL FOR DIABETES. 60 tablet 0  . nitroGLYCERIN (NITROSTAT) 0.4 MG SL tablet DISSOLVE 1 TABLET UNDER TONGUE EVERY 5 MINUTES UP TO 15 MIN FOR CHESTPAIN. IF NO RELIEF CALL 911. 25 tablet 0  . potassium chloride (K-DUR) 10 MEQ tablet Take 1 tablet (10 mEq total) by mouth 2 (two) times daily. Take with fluid pill 180 tablet 3  . propranolol ER (INDERAL LA) 60 MG 24 hr capsule  Take 1 capsule (60 mg total) by mouth daily. 30 capsule 1  . furosemide (LASIX) 40 MG tablet Take 1 tablet (40 mg total) by mouth 2 (two) times daily. For fluid 180 tablet 3   No current facility-administered medications on file prior to visit.     Allergies  Allergen Reactions  . Lisinopril Cough    Family History  Problem Relation Age of Onset  . Heart disease Unknown   . Arthritis Unknown   . Diabetes Unknown   . Cancer Mother        deceased    BP 130/72 (BP Location: Right Arm, Patient Position: Sitting, Cuff Size: Large)   Pulse 73   Ht 5\' 8"  (1.727 m)   Wt 249 lb (112.9 kg)   SpO2 95%   BMI 37.86 kg/m    Review of Systems He denies hypoglycemia.     Objective:   Physical Exam VITAL SIGNS:  See vs page.  GENERAL: no distress.  Pulses: dorsalis pedis intact bilat.   MSK: no deformity of the feet.  CV: 1+ bilat leg edema, and bilat vv's.   Skin:  No foot ulcer.  normal color and temp on the feet are slightly cool to touch (not wearing socks).  Neuro: sensation is intact to touch on the feet, but decreased from normal.  Ext: There is bilateral onychomycosis of the toenails.     Lab Results  Component Value Date   HGBA1C 10.0 (A) 09/04/2018       Assessment & Plan:  Insulin-requiring type 2 DM, with polyneuropathy: worse Weight gain: this may explain deterioration.  Adding GLP might help, but I hesitate to complicate regimen  Patient Instructions  check your blood sugar twice a day.  vary the time of day when you check, between before the 3 meals, and at bedtime.  also check if you have symptoms of your blood sugar being too high or too low.  please keep a record of the readings and bring it to your next appointment here (or you can bring the meter itself).  You can write it on any piece of paper.  please call us sooner if your blood sugar goes below 70, or if you have a lot of readings over 200.  Please increase the toujeo to 160 units each morning.     Please come back for a follow-up appointment in 2  months.

## 2018-09-04 NOTE — Patient Instructions (Addendum)
check your blood sugar twice a day.  vary the time of day when you check, between before the 3 meals, and at bedtime.  also check if you have symptoms of your blood sugar being too high or too low.  please keep a record of the readings and bring it to your next appointment here (or you can bring the meter itself).  You can write it on any piece of paper.  please call us sooner if your blood sugar goes below 70, or if you have a lot of readings over 200.  Please increase the toujeo to 160 units each morning.   Please come back for a follow-up appointment in 2 months.

## 2018-10-25 DIAGNOSIS — B351 Tinea unguium: Secondary | ICD-10-CM | POA: Diagnosis not present

## 2018-10-25 DIAGNOSIS — L84 Corns and callosities: Secondary | ICD-10-CM | POA: Diagnosis not present

## 2018-10-25 DIAGNOSIS — E1142 Type 2 diabetes mellitus with diabetic polyneuropathy: Secondary | ICD-10-CM | POA: Diagnosis not present

## 2018-10-25 DIAGNOSIS — M79676 Pain in unspecified toe(s): Secondary | ICD-10-CM | POA: Diagnosis not present

## 2018-11-07 ENCOUNTER — Encounter: Payer: Self-pay | Admitting: Endocrinology

## 2018-11-07 ENCOUNTER — Ambulatory Visit (INDEPENDENT_AMBULATORY_CARE_PROVIDER_SITE_OTHER): Payer: Medicare Other | Admitting: Endocrinology

## 2018-11-07 VITALS — BP 120/78 | HR 78 | Ht 68.0 in | Wt 247.8 lb

## 2018-11-07 DIAGNOSIS — Z794 Long term (current) use of insulin: Secondary | ICD-10-CM

## 2018-11-07 DIAGNOSIS — E0840 Diabetes mellitus due to underlying condition with diabetic neuropathy, unspecified: Secondary | ICD-10-CM | POA: Diagnosis not present

## 2018-11-07 LAB — POCT GLYCOSYLATED HEMOGLOBIN (HGB A1C): Hemoglobin A1C: 9.2 % — AB (ref 4.0–5.6)

## 2018-11-07 MED ORDER — INSULIN GLARGINE (1 UNIT DIAL) 300 UNIT/ML ~~LOC~~ SOPN: 170.0000 [IU] | PEN_INJECTOR | SUBCUTANEOUS | 11 refills | Status: DC

## 2018-11-07 NOTE — Progress Notes (Signed)
Subjective:    Patient ID: Kevin [name], male    DOB: Mar 05, 1956, 63 y.o.   MRN: [id number]  HPI Pt returns for f/u of diabetes mellitus: DM type: Insulin-requiring type 2. Dx'ed: 4656 Complications: polyneuropathy Therapy: insulin since 2001, and metformin DKA: never Severe hypoglycemia: never Pancreatitis: never Pancreatic imaging: never Other: he is not a candidate for multiple daily injections, due to h/o noncompliance.   no cbg record, but states cbg's vary from 97-180.  There is no trend throughout the day.   pt states he feels well in general.  Pt says he never misses the insulin.  He takes 160 units qam.   Past Medical History:  Diagnosis Date  . Arthritis   . Chronic knee pain   . Depression   . Diabetes mellitus ([place])   . Hyperlipidemia   . Hypertension   . Leg edema   . Obesity     Past Surgical History:  Procedure Laterality Date  . none      Social History   Socioeconomic History  . Marital status: Single    Spouse name: Not on file  . Number of children: 0  . Years of education: Not on file  . Highest education level: Not on file  Occupational History  . Not on file  Social Needs  . Financial resource strain: Not on file  . Food insecurity:    Worry: Not on file    Inability: Not on file  . Transportation needs:    Medical: Not on file    Non-medical: Not on file  Tobacco Use  . Smoking status: Current Every Day Smoker    Packs/day: 0.20  . Smokeless tobacco: Never Used  Substance and Sexual Activity  . Alcohol use: No  . Drug use: No  . Sexual activity: Not Currently  Lifestyle  . Physical activity:    Days per week: Not on file    Minutes per session: Not on file  . Stress: Not on file  Relationships  . Social connections:    Talks on phone: Not on file    Gets together: Not on file    Attends religious service: Not on file    Active member of club or organization: Not on file    Attends meetings of clubs or organizations:  Not on file    Relationship status: Not on file  . Intimate partner violence:    Fear of current or ex partner: Not on file    Emotionally abused: Not on file    Physically abused: Not on file    Forced sexual activity: Not on file  Other Topics Concern  . Not on file  Social History Narrative   Patient's POA is his sister, [name] [phone].            Current Outpatient Medications on File Prior to Visit  Medication Sig Dispense Refill  . amLODipine (NORVASC) 5 MG tablet Take 1 tablet (5 mg total) by mouth daily. For blood pressure 90 tablet 1  . aspirin EC 81 MG tablet Take 81 mg by mouth daily.    [name] atorvastatin (LIPITOR) 80 MG tablet Take 1 tablet (80 mg total) by mouth daily. 90 tablet 3  . clotrimazole (LOTRIMIN) 1 % cream Apply 1 application topically 2 (two) times daily. 30 g 1  . diclofenac (VOLTAREN) 75 MG EC tablet TAKE (1) TABLET BY MOUTH TWICE DAILY FOR INFLAMMATION. 60 tablet 11  . diclofenac sodium (VOLTAREN) 1 % GEL  APPLY TO THE KNEE THREE TIMES DAILY AS NEEDED. 100 g 0  . FLUoxetine (PROZAC) 20 MG capsule Take 1 capsule (20 mg total) by mouth daily. For your nerves 90 capsule 3  . folic acid (FOLVITE) 1 MG tablet Take 1 tablet (1 mg total) by mouth daily. 90 tablet 3  . gabapentin (NEURONTIN) 100 MG capsule Take 1-2 capsules at bedtime FOR neuropathy and pain 60 capsule 3  . Glucose Blood (BLOOD GLUCOSE TEST STRIPS) STRP Use to monitor FSBS 3x daily. Dx: E11.65. 100 each 3  . metFORMIN (GLUCOPHAGE) 1000 MG tablet TAKE 1 TABLET BY MOUTH TWICE DAILY WITH A MEAL FOR DIABETES. 60 tablet 0  . nitroGLYCERIN (NITROSTAT) 0.4 MG SL tablet DISSOLVE 1 TABLET UNDER TONGUE EVERY 5 MINUTES UP TO 15 MIN FOR CHESTPAIN. IF NO RELIEF CALL 911. 25 tablet 0  . potassium chloride (K-DUR) 10 MEQ tablet Take 1 tablet (10 mEq total) by mouth 2 (two) times daily. Take with fluid pill 180 tablet 3  . propranolol ER (INDERAL LA) 60 MG 24 hr capsule Take 1 capsule (60 mg total) by mouth daily.  30 capsule 1  . furosemide (LASIX) 40 MG tablet Take 1 tablet (40 mg total) by mouth 2 (two) times daily. For fluid 180 tablet 3   No current facility-administered medications on file prior to visit.     Allergies  Allergen Reactions  . Lisinopril Cough    Family History  Problem Relation Age of Onset  . Heart disease Unknown   . Arthritis Unknown   . Diabetes Unknown   . Cancer Mother        deceased    BP 120/78 (BP Location: Right Arm, Patient Position: Sitting, Cuff Size: Large)   Pulse 78   Ht 5\' 8"  (1.727 m)   Wt 247 lb 12.8 oz (112.4 kg)   SpO2 95%   BMI 37.68 kg/m    Review of Systems He denies hypoglycemia.      Objective:   Physical Exam VITAL SIGNS:  See vs page.  GENERAL: no distress.  Pulses: dorsalis pedis intact bilat.   MSK: no deformity of the feet.  CV: 1+ bilat leg edema, and bilat vv's.   Skin:  No foot ulcer.  feet are slightly cool to touch (not wearing socks), and chronic cyanosis of the feet.   Neuro: sensation is intact to touch on the feet, but decreased from normal.  Ext: There is bilateral onychomycosis of the toenails.     Lab Results  Component Value Date   HGBA1C 9.2 (A) 11/07/2018       Assessment & Plan:  Insulin-requiring type 2 DM, with PN: he needs increased rx.    Patient Instructions  check your blood sugar twice a day.  vary the time of day when you check, between before the 3 meals, and at bedtime.  also check if you have symptoms of your blood sugar being too high or too low.  please keep a record of the readings and bring it to your next appointment here (or you can bring the meter itself).  You can write it on any piece of paper.  please call us sooner if your blood sugar goes below 70, or if you have a lot of readings over 200.  Please increase the toujeo to 170 units each morning.   Please come back for a follow-up appointment in 2 months.

## 2018-11-07 NOTE — Patient Instructions (Signed)
check your blood sugar twice a day.  vary the time of day when you check, between before the 3 meals, and at bedtime.  also check if you have symptoms of your blood sugar being too high or too low.  please keep a record of the readings and bring it to your next appointment here (or you can bring the meter itself).  You can write it on any piece of paper.  please call us sooner if your blood sugar goes below 70, or if you have a lot of readings over 200.  Please increase the toujeo to 170 units each morning.   Please come back for a follow-up appointment in 2 months.

## 2018-11-27 ENCOUNTER — Other Ambulatory Visit: Payer: Self-pay

## 2018-11-27 ENCOUNTER — Other Ambulatory Visit: Payer: Self-pay | Admitting: *Deleted

## 2018-11-27 ENCOUNTER — Ambulatory Visit (INDEPENDENT_AMBULATORY_CARE_PROVIDER_SITE_OTHER): Payer: Medicare Other | Admitting: Family Medicine

## 2018-11-27 ENCOUNTER — Encounter: Payer: Self-pay | Admitting: Family Medicine

## 2018-11-27 VITALS — BP 126/82 | HR 76 | Temp 99.7°F | Resp 14 | Ht 68.0 in | Wt 250.0 lb

## 2018-11-27 DIAGNOSIS — E0843 Diabetes mellitus due to underlying condition with diabetic autonomic (poly)neuropathy: Secondary | ICD-10-CM

## 2018-11-27 DIAGNOSIS — Z794 Long term (current) use of insulin: Secondary | ICD-10-CM | POA: Diagnosis not present

## 2018-11-27 DIAGNOSIS — E0840 Diabetes mellitus due to underlying condition with diabetic neuropathy, unspecified: Secondary | ICD-10-CM

## 2018-11-27 DIAGNOSIS — I10 Essential (primary) hypertension: Secondary | ICD-10-CM | POA: Diagnosis not present

## 2018-11-27 DIAGNOSIS — Z6838 Body mass index (BMI) 38.0-38.9, adult: Secondary | ICD-10-CM

## 2018-11-27 DIAGNOSIS — Z720 Tobacco use: Secondary | ICD-10-CM

## 2018-11-27 DIAGNOSIS — E782 Mixed hyperlipidemia: Secondary | ICD-10-CM

## 2018-11-27 DIAGNOSIS — E66812 Obesity, class 2: Secondary | ICD-10-CM

## 2018-11-27 LAB — CBC WITH DIFFERENTIAL/PLATELET
Absolute Monocytes: 758 cells/uL (ref 200–950)
Basophils Absolute: 125 cells/uL (ref 0–200)
Basophils Relative: 1.3 %
Eosinophils Absolute: 422 cells/uL (ref 15–500)
Eosinophils Relative: 4.4 %
HCT: 47.2 % (ref 38.5–50.0)
Hemoglobin: 16.3 g/dL (ref 13.2–17.1)
Lymphs Abs: 2842 cells/uL (ref 850–3900)
MCH: 32 pg (ref 27.0–33.0)
MCHC: 34.5 g/dL (ref 32.0–36.0)
MCV: 92.5 fL (ref 80.0–100.0)
MPV: 10.8 fL (ref 7.5–12.5)
Monocytes Relative: 7.9 %
Neutro Abs: 5453 cells/uL (ref 1500–7800)
Neutrophils Relative %: 56.8 %
Platelets: 215 10*3/uL (ref 140–400)
RBC: 5.1 10*6/uL (ref 4.20–5.80)
RDW: 11 % (ref 11.0–15.0)
Total Lymphocyte: 29.6 %
WBC: 9.6 10*3/uL (ref 3.8–10.8)

## 2018-11-27 LAB — COMPREHENSIVE METABOLIC PANEL
AG Ratio: 1.2 (calc) (ref 1.0–2.5)
ALT: 28 U/L (ref 9–46)
AST: 25 U/L (ref 10–35)
Albumin: 4 g/dL (ref 3.6–5.1)
Alkaline phosphatase (APISO): 80 U/L (ref 35–144)
BUN: 10 mg/dL (ref 7–25)
CO2: 26 mmol/L (ref 20–32)
Calcium: 9.2 mg/dL (ref 8.6–10.3)
Chloride: 102 mmol/L (ref 98–110)
Creat: 0.74 mg/dL (ref 0.70–1.25)
Globulin: 3.3 g/dL (calc) (ref 1.9–3.7)
Glucose, Bld: 228 mg/dL — ABNORMAL HIGH (ref 65–99)
Potassium: 4.3 mmol/L (ref 3.5–5.3)
Sodium: 137 mmol/L (ref 135–146)
Total Bilirubin: 0.6 mg/dL (ref 0.2–1.2)
Total Protein: 7.3 g/dL (ref 6.1–8.1)

## 2018-11-27 LAB — LIPID PANEL
Cholesterol: 195 mg/dL (ref <200)
HDL: 38 mg/dL — ABNORMAL LOW (ref >=40)
LDL Cholesterol (Calc): 132 mg/dL (calc) — ABNORMAL HIGH
Non-HDL Cholesterol (Calc): 157 mg/dL (calc) — ABNORMAL HIGH (ref <130)
Total CHOL/HDL Ratio: 5.1 (calc) — ABNORMAL HIGH (ref <5.0)
Triglycerides: 142 mg/dL (ref <150)

## 2018-11-27 MED ORDER — ZOSTER VAC RECOMB ADJUVANTED 50 MCG/0.5ML IM SUSR: 0.5000 mL | Freq: Once | INTRAMUSCULAR | 1 refills | Status: AC

## 2018-11-27 NOTE — Assessment & Plan Note (Signed)
Continue gabapentin.

## 2018-11-27 NOTE — Progress Notes (Signed)
   Subjective:    Patient ID: Kevin [name], male    DOB: 13-Aug-1956, 63 y.o.   MRN: [id number]  Patient presents for Follow-up (is not fasting)  Pt here to f/u chronic medical problems    DM- followed by [name], taking 170units of Tujeo, last A1C was 9.2%. Due for urine mico  Hyperlipidemia- due for Lipid panel - lipitor   Has chronic neck pain and knee pain- seen by orthopedics, but they cant operate until his A1C is down   Podiatry- Dr. Irving Shows in [place], cuts his nails , given a neuropathy cream he gets from insurance OTC book   He did a stool card from his [place] with insurance , but states he did send the cologuard back last year         Review Of Systems:  GEN- denies fatigue, fever, weight loss,weakness, recent illness HEENT- denies eye drainage, change in vision, nasal discharge, CVS- denies chest pain, palpitations RESP- denies SOB, cough, wheeze ABD- denies N/V, change in stools, abd pain GU- denies dysuria, hematuria, dribbling, incontinence MSK-+joint pain, muscle aches, injury Neuro- denies headache, dizziness, syncope, seizure activity       Objective:    BP 126/82   Pulse 76   Temp 99.7 F (37.6 C) (Oral)   Resp 14   Ht 5\' 8"  (1.727 m)   Wt 250 lb (113.4 kg)   SpO2 98%   BMI 38.01 kg/m  GEN- NAD, alert and oriented x3 HEENT- PERRL, EOMI, non injected sclera, pink conjunctiva, MMM, oropharynx clear Neck- Supple, no thyromegaly CVS- RRR, no murmur RESP-CTAB ABD-NABS,soft,NT,ND EXT- No edema Pulses- Radial, DP- 2+        Assessment & Plan:      Problem List Items Addressed This Visit      Unprioritized   Diabetes mellitus with neurological manifestation ([place])    Uncontrolled , working with endocrinology to improve blood sugars Seen by podiatry  On statin drug Referral for eye exam Unable to leave sample for Urine micro       Relevant Orders   Ambulatory referral to Ophthalmology   Lipid panel   Essential  hypertension, benign - Primary    Controlled no changes       Relevant Orders   CBC with Differential/Platelet   Comprehensive metabolic panel   Hyperlipidemia    States he is taking lipitor Check lipids and LFT today       Relevant Orders   Lipid panel   Neuropathy, diabetic ([place])    Continue gabapentin      Obesity    Diet has been constant issue for him We have discussed the high carb foods and snacks      Tobacco use    counseled on tobacco cessation         Note: This dictation was prepared with Dragon dictation along with smaller phrase technology. Any transcriptional errors that result from this process are unintentional.

## 2018-11-27 NOTE — Assessment & Plan Note (Signed)
Controlled no changes 

## 2018-11-27 NOTE — Patient Instructions (Addendum)
Shingrix shot - sent to pharmacy  Referral to eye doctor  F/U 4 month for Physical

## 2018-11-27 NOTE — Assessment & Plan Note (Signed)
Diet has been constant issue for him We have discussed the high carb foods and snacks

## 2018-11-27 NOTE — Assessment & Plan Note (Signed)
States he is taking lipitor Check lipids and LFT today

## 2018-11-27 NOTE — Assessment & Plan Note (Signed)
Uncontrolled , working with endocrinology to improve blood sugars Seen by podiatry  On statin drug Referral for eye exam Unable to leave sample for Urine micro

## 2018-11-27 NOTE — Assessment & Plan Note (Signed)
counseled on tobacco cessation

## 2018-11-29 ENCOUNTER — Other Ambulatory Visit: Payer: Self-pay | Admitting: *Deleted

## 2018-11-29 MED ORDER — ROSUVASTATIN CALCIUM 10 MG PO TABS: 10.0000 mg | ORAL_TABLET | Freq: Every day | ORAL | 3 refills | Status: DC

## 2018-12-27 DIAGNOSIS — E1142 Type 2 diabetes mellitus with diabetic polyneuropathy: Secondary | ICD-10-CM | POA: Diagnosis not present

## 2018-12-27 DIAGNOSIS — L84 Corns and callosities: Secondary | ICD-10-CM | POA: Diagnosis not present

## 2018-12-27 DIAGNOSIS — B351 Tinea unguium: Secondary | ICD-10-CM | POA: Diagnosis not present

## 2018-12-27 DIAGNOSIS — M79676 Pain in unspecified toe(s): Secondary | ICD-10-CM | POA: Diagnosis not present

## 2019-01-07 ENCOUNTER — Ambulatory Visit: Payer: Medicare Other | Admitting: Endocrinology

## 2019-02-11 ENCOUNTER — Ambulatory Visit (INDEPENDENT_AMBULATORY_CARE_PROVIDER_SITE_OTHER): Payer: Medicare Other | Admitting: Endocrinology

## 2019-02-11 ENCOUNTER — Other Ambulatory Visit: Payer: Self-pay

## 2019-02-11 ENCOUNTER — Ambulatory Visit: Payer: Medicare Other | Admitting: Endocrinology

## 2019-02-11 ENCOUNTER — Encounter: Payer: Self-pay | Admitting: Endocrinology

## 2019-02-11 DIAGNOSIS — Z794 Long term (current) use of insulin: Secondary | ICD-10-CM

## 2019-02-11 DIAGNOSIS — E0840 Diabetes mellitus due to underlying condition with diabetic neuropathy, unspecified: Secondary | ICD-10-CM

## 2019-02-11 NOTE — Patient Instructions (Addendum)
check your blood sugar twice a day.  vary the time of day when you check, between before the 3 meals, and at bedtime.  also check if you have symptoms of your blood sugar being too high or too low.  please keep a record of the readings and bring it to your next appointment here (or you can bring the meter itself).  You can write it on any piece of paper.  please call us sooner if your blood sugar goes below 70, or if you have a lot of readings over 200.   Please continue the same insulin.   Please come back for a follow-up appointment in 2 months.     

## 2019-02-11 NOTE — Progress Notes (Signed)
Subjective:    Patient ID: Kevin [name], male    DOB: March 14, 1956, 63 y.o.   MRN: [id number]  HPI  telehealth visit today via phone x 6 minutes.  Alternatives to telehealth are presented to this patient, and the patient agrees to the telehealth visit.  Pt is advised of the cost of the visit, and agrees to this, also.   Patient is at home, and I am at the office.   Persons attending the telehealth visit: the patient and I.  Pt returns for f/u of diabetes mellitus: DM type: Insulin-requiring type 2. Dx'ed: 6503 Complications: polyneuropathy Therapy: insulin since 2001, and metformin.  DKA: never Severe hypoglycemia: never.   Pancreatitis: never Pancreatic imaging: never Other: he is not a candidate for multiple daily injections, due to h/o noncompliance.   Interval Hx: pt states cbg's are in the low-100's.  pt states he feels well in general.  Pt says he never misses the insulin.  He takes 170 units qam.  Past Medical History:  Diagnosis Date  . Arthritis   . Chronic knee pain   . Depression   . Diabetes mellitus ([place])   . Hyperlipidemia   . Hypertension   . Leg edema   . Obesity     Past Surgical History:  Procedure Laterality Date  . none      Social History   Socioeconomic History  . Marital status: Single    Spouse name: Not on file  . Number of children: 0  . Years of education: Not on file  . Highest education level: Not on file  Occupational History  . Not on file  Social Needs  . Financial resource strain: Not on file  . Food insecurity:    Worry: Not on file    Inability: Not on file  . Transportation needs:    Medical: Not on file    Non-medical: Not on file  Tobacco Use  . Smoking status: Current Every Day Smoker    Packs/day: 0.20  . Smokeless tobacco: Never Used  Substance and Sexual Activity  . Alcohol use: No  . Drug use: No  . Sexual activity: Not Currently  Lifestyle  . Physical activity:    Days per week: Not on file    Minutes  per session: Not on file  . Stress: Not on file  Relationships  . Social connections:    Talks on phone: Not on file    Gets together: Not on file    Attends religious service: Not on file    Active member of club or organization: Not on file    Attends meetings of clubs or organizations: Not on file    Relationship status: Not on file  . Intimate partner violence:    Fear of current or ex partner: Not on file    Emotionally abused: Not on file    Physically abused: Not on file    Forced sexual activity: Not on file  Other Topics Concern  . Not on file  Social History Narrative   Patient's POA is his sister, Kevin [name] [phone].            Current Outpatient Medications on File Prior to Visit  Medication Sig Dispense Refill  . amLODipine (NORVASC) 5 MG tablet Take 1 tablet (5 mg total) by mouth daily. For blood pressure 90 tablet 1  . aspirin EC 81 MG tablet Take 81 mg by mouth daily.    . clotrimazole (LOTRIMIN) 1 % cream  Apply 1 application topically 2 (two) times daily. 30 g 1  . diclofenac (VOLTAREN) 75 MG EC tablet TAKE (1) TABLET BY MOUTH TWICE DAILY FOR INFLAMMATION. 60 tablet 11  . diclofenac sodium (VOLTAREN) 1 % GEL APPLY TO THE KNEE THREE TIMES DAILY AS NEEDED. 100 g 0  . FLUoxetine (PROZAC) 20 MG capsule Take 1 capsule (20 mg total) by mouth daily. For your nerves 90 capsule 3  . folic acid (FOLVITE) 1 MG tablet Take 1 tablet (1 mg total) by mouth daily. 90 tablet 3  . gabapentin (NEURONTIN) 100 MG capsule Take 1-2 capsules at bedtime FOR neuropathy and pain 60 capsule 3  . Glucose Blood (BLOOD GLUCOSE TEST STRIPS) STRP Use to monitor FSBS 3x daily. Dx: E11.65. 100 each 3  . Insulin Glargine, 1 Unit Dial, (TOUJEO SOLOSTAR) 300 UNIT/ML SOPN Inject 170 Units into the skin every morning. And pen needles 1/day 9 pen 11  . metFORMIN (GLUCOPHAGE) 1000 MG tablet TAKE 1 TABLET BY MOUTH TWICE DAILY WITH A MEAL FOR DIABETES. 60 tablet 0  . nitroGLYCERIN (NITROSTAT) 0.4 MG SL  tablet DISSOLVE 1 TABLET UNDER TONGUE EVERY 5 MINUTES UP TO 15 MIN FOR CHESTPAIN. IF NO RELIEF CALL 911. 25 tablet 0  . potassium chloride (K-DUR) 10 MEQ tablet Take 1 tablet (10 mEq total) by mouth 2 (two) times daily. Take with fluid pill 180 tablet 3  . propranolol ER (INDERAL LA) 60 MG 24 hr capsule Take 1 capsule (60 mg total) by mouth daily. 30 capsule 1  . rosuvastatin (CRESTOR) 10 MG tablet Take 1 tablet (10 mg total) by mouth daily. 90 tablet 3  . furosemide (LASIX) 40 MG tablet Take 1 tablet (40 mg total) by mouth 2 (two) times daily. For fluid 180 tablet 3   No current facility-administered medications on file prior to visit.     Allergies  Allergen Reactions  . Lisinopril Cough    Family History  Problem Relation Age of Onset  . Heart disease Unknown   . Arthritis Unknown   . Diabetes Unknown   . Cancer Mother        deceased    There were no vitals taken for this visit.   Review of Systems He denies hypoglycemia.      Objective:   Physical Exam   Lab Results  Component Value Date   CREATININE 0.74 11/27/2018   BUN 10 11/27/2018   NA 137 11/27/2018   K 4.3 11/27/2018   CL 102 11/27/2018   CO2 26 11/27/2018       Assessment & Plan:  Insulin-requiring type 2 DM, with PN: apparently well-controlled.  we discussed.  he declines a1c now.   Patient Instructions  check your blood sugar twice a day.  vary the time of day when you check, between before the 3 meals, and at bedtime.  also check if you have symptoms of your blood sugar being too high or too low.  please keep a record of the readings and bring it to your next appointment here (or you can bring the meter itself).  You can write it on any piece of paper.  please call us sooner if your blood sugar goes below 70, or if you have a lot of readings over 200.  Please continue the same insulin.   Please come back for a follow-up appointment in 2 months.

## 2019-03-07 DIAGNOSIS — E1142 Type 2 diabetes mellitus with diabetic polyneuropathy: Secondary | ICD-10-CM | POA: Diagnosis not present

## 2019-03-07 DIAGNOSIS — L84 Corns and callosities: Secondary | ICD-10-CM | POA: Diagnosis not present

## 2019-03-07 DIAGNOSIS — B351 Tinea unguium: Secondary | ICD-10-CM | POA: Diagnosis not present

## 2019-03-07 DIAGNOSIS — M79676 Pain in unspecified toe(s): Secondary | ICD-10-CM | POA: Diagnosis not present

## 2019-03-14 ENCOUNTER — Other Ambulatory Visit: Payer: Self-pay | Admitting: Family Medicine

## 2019-03-29 ENCOUNTER — Encounter: Payer: Self-pay | Admitting: Family Medicine

## 2019-03-29 ENCOUNTER — Other Ambulatory Visit: Payer: Self-pay

## 2019-03-29 ENCOUNTER — Ambulatory Visit (INDEPENDENT_AMBULATORY_CARE_PROVIDER_SITE_OTHER): Payer: Medicare Other | Admitting: Family Medicine

## 2019-03-29 VITALS — BP 132/68 | HR 80 | Temp 97.9°F | Resp 14 | Ht 68.0 in | Wt 251.0 lb

## 2019-03-29 DIAGNOSIS — E0843 Diabetes mellitus due to underlying condition with diabetic autonomic (poly)neuropathy: Secondary | ICD-10-CM

## 2019-03-29 DIAGNOSIS — M542 Cervicalgia: Secondary | ICD-10-CM

## 2019-03-29 DIAGNOSIS — Z0000 Encounter for general adult medical examination without abnormal findings: Secondary | ICD-10-CM

## 2019-03-29 DIAGNOSIS — Z550 Illiteracy and low-level literacy: Secondary | ICD-10-CM

## 2019-03-29 DIAGNOSIS — Z0001 Encounter for general adult medical examination with abnormal findings: Secondary | ICD-10-CM

## 2019-03-29 DIAGNOSIS — Z794 Long term (current) use of insulin: Secondary | ICD-10-CM

## 2019-03-29 DIAGNOSIS — E782 Mixed hyperlipidemia: Secondary | ICD-10-CM

## 2019-03-29 DIAGNOSIS — E0840 Diabetes mellitus due to underlying condition with diabetic neuropathy, unspecified: Secondary | ICD-10-CM | POA: Diagnosis not present

## 2019-03-29 DIAGNOSIS — F419 Anxiety disorder, unspecified: Secondary | ICD-10-CM

## 2019-03-29 DIAGNOSIS — Z125 Encounter for screening for malignant neoplasm of prostate: Secondary | ICD-10-CM

## 2019-03-29 MED ORDER — POTASSIUM CHLORIDE ER 10 MEQ PO TBCR: 10.0000 meq | EXTENDED_RELEASE_TABLET | Freq: Two times a day (BID) | ORAL | 3 refills | Status: DC

## 2019-03-29 MED ORDER — DULOXETINE HCL 30 MG PO CPEP: 30.0000 mg | ORAL_CAPSULE | Freq: Every day | ORAL | 3 refills | Status: DC

## 2019-03-29 MED ORDER — ROSUVASTATIN CALCIUM 10 MG PO TABS: 10.0000 mg | ORAL_TABLET | Freq: Every day | ORAL | 3 refills | Status: DC

## 2019-03-29 MED ORDER — FUROSEMIDE 40 MG PO TABS: 40.0000 mg | ORAL_TABLET | Freq: Two times a day (BID) | ORAL | 3 refills | Status: DC

## 2019-03-29 MED ORDER — AMLODIPINE BESYLATE 5 MG PO TABS: ORAL_TABLET | ORAL | 3 refills | Status: DC

## 2019-03-29 MED ORDER — METFORMIN HCL 1000 MG PO TABS: ORAL_TABLET | ORAL | 2 refills | Status: DC

## 2019-03-29 MED ORDER — FOLIC ACID 1 MG PO TABS: 1.0000 mg | ORAL_TABLET | Freq: Every day | ORAL | 3 refills | Status: DC

## 2019-03-29 MED ORDER — PROPRANOLOL HCL ER 60 MG PO CP24: 60.0000 mg | ORAL_CAPSULE | Freq: Every day | ORAL | 2 refills | Status: DC

## 2019-03-29 NOTE — Patient Instructions (Signed)
F/U 4 months  

## 2019-03-29 NOTE — Progress Notes (Signed)
Subjective:   Patient presents for Medicare Annual/Subsequent preventive examination.    DM- followed by endocrinology, states highest blood sugar has been 107   Taking Tujeo 180units , metformin 1000mg  twice a day   Seen by Dr. Irving Shows- had nail clipping    HTN- taking Bp meds as prescribed   Hyperliidemia- last LDL was 132, taking Crestor     Anxiety- taking prozac   States he did the cologuard   Review Past Medical/Family/Social: per EMR   Risk Factors  Current exercise habits: None Dietary issues discussed: Yes  Cardiac risk factors: Obesity (BMI >= 30 kg/m2). DM,HTN  Depression Screen  (Note: if answer to either of the following is "Yes", a more complete depression screening is indicated)  Over the past two weeks, have you felt down, depressed or hopeless? No Over the past two weeks, have you felt little interest or pleasure in doing things? No Have you lost interest or pleasure in daily life? No Do you often feel hopeless? No Do you cry easily over simple problems? No   Activities of Daily Living  In your present state of health, do you have any difficulty performing the following activities?:  Driving? No  Managing money? No  Feeding yourself? No  Getting from bed to chair? No  Climbing a flight of stairs? No  Preparing food and eating?: No  Bathing or showering? No  Getting dressed: No  Getting to the toilet? No  Using the toilet:No  Moving around from place to place: No  In the past year have you fallen or had a near fall?:No  Are you sexually active? No  Do you have more than one partner? No   Hearing Difficulties: No  Do you often ask people to speak up or repeat themselves? No  Do you experience ringing or noises in your ears? No Do you have difficulty understanding soft or whispered voices? No  Do you feel that you have a problem with memory? No Do you often misplace items? No  Do you feel safe at home? Yes  Cognitive Testing  Alert? Yes Normal  Appearance?Yes  Oriented to person? Yes Place? Yes  Time? Yes  Recall of three objects? Yes  Can perform simple calculations? Yes  Displays appropriate judgment?Yes  Can read the correct time from a watch face?Yes    List the Names of Other Physician/Practitioners you currently use:  Podiatry, Endocrinology   Screening Tests / Date Colonoscopy     Due                Pneumonia- UTD Influenza Vaccine - UTD Shingles unable to afford   Tetanus/tdap - unable to afford  ROS: GEN- denies fatigue, fever, weight loss,weakness, recent illness HEENT- denies eye drainage, change in vision, nasal discharge, CVS- denies chest pain, palpitations RESP- denies SOB, cough, wheeze ABD- denies N/V, change in stools, abd pain GU- denies dysuria, hematuria, dribbling, incontinence MSK- denies joint pain, muscle aches, injury Neuro- denies headache, dizziness, syncope, seizure activity  Physical, Vitals reviewed  GEN- NAD, alert and oriented x3 HEENT- PERRL, EOMI, non injected sclera, pink conjunctiva, MMM, oropharynx clear Neck- Supple, no thryomegaly CVS- RRR, no murmur RESP-CTAB ABD-NABS,soft,NT,ND EXT- No edema,thick toenails Pulses- Radial, DP- 2+,    Assessment:    Annual wellness medicare exam   Plan:    During the course of the visit the patient was educated and counseled about appropriate screening and preventive services including:    Prevention- unable to afford shingles/tdap. psa  screening done today    DM- uncontrolled followed by endo, has not had labs in setting of COVID, will obtain and forward, no changes to dosing today   HTN- controlled  Hyperlipidemia- on statin drug Crestor  Peripheral neuropathy- on cymbalta by podiatry,  Depression- was on prozac, unclear when she stopped but on cymbalta   Audit C/Fall/Depression screen negative      Diet review for nutrition referral? Yes ____ Not Indicated __x__  - He has had this in past, no significant changes   Patient Instructions (the written plan) was given to the patient.  Medicare Attestation  I have personally reviewed:  The patient's medical and social history  Their use of alcohol, tobacco or illicit drugs  Their current medications and supplements  The patient's functional ability including ADLs,fall risks, home safety risks, cognitive, and hearing and visual impairment  Diet and physical activities  Evidence for depression or mood disorders  The patient's weight, height, BMI, and visual acuity have been recorded in the chart. I have made referrals, counseling, and provided education to the patient based on review of the above and I have provided the patient with a written personalized care plan for preventive services.

## 2019-03-30 LAB — CBC WITH DIFFERENTIAL/PLATELET
Absolute Monocytes: 840 cells/uL (ref 200–950)
Basophils Absolute: 116 cells/uL (ref 0–200)
Basophils Relative: 1.1 %
Eosinophils Absolute: 441 cells/uL (ref 15–500)
Eosinophils Relative: 4.2 %
HCT: 45.7 % (ref 38.5–50.0)
Hemoglobin: 16 g/dL (ref 13.2–17.1)
Lymphs Abs: 3066 cells/uL (ref 850–3900)
MCH: 33.1 pg — ABNORMAL HIGH (ref 27.0–33.0)
MCHC: 35 g/dL (ref 32.0–36.0)
MCV: 94.4 fL (ref 80.0–100.0)
MPV: 11.4 fL (ref 7.5–12.5)
Monocytes Relative: 8 %
Neutro Abs: 6038 cells/uL (ref 1500–7800)
Neutrophils Relative %: 57.5 %
Platelets: 194 10*3/uL (ref 140–400)
RBC: 4.84 10*6/uL (ref 4.20–5.80)
RDW: 11.3 % (ref 11.0–15.0)
Total Lymphocyte: 29.2 %
WBC: 10.5 10*3/uL (ref 3.8–10.8)

## 2019-03-30 LAB — HEMOGLOBIN A1C
Hgb A1c MFr Bld: 9.6 % of total Hgb — ABNORMAL HIGH (ref <5.7)
Mean Plasma Glucose: 229 (calc)
eAG (mmol/L): 12.7 (calc)

## 2019-03-30 LAB — LIPID PANEL
Cholesterol: 196 mg/dL (ref <200)
HDL: 36 mg/dL — ABNORMAL LOW (ref >=40)
LDL Cholesterol (Calc): 121 mg/dL (calc) — ABNORMAL HIGH
Non-HDL Cholesterol (Calc): 160 mg/dL (calc) — ABNORMAL HIGH (ref <130)
Total CHOL/HDL Ratio: 5.4 (calc) — ABNORMAL HIGH (ref <5.0)
Triglycerides: 244 mg/dL — ABNORMAL HIGH (ref <150)

## 2019-03-30 LAB — COMPREHENSIVE METABOLIC PANEL
AG Ratio: 1.4 (calc) (ref 1.0–2.5)
ALT: 30 U/L (ref 9–46)
AST: 33 U/L (ref 10–35)
Albumin: 4 g/dL (ref 3.6–5.1)
Alkaline phosphatase (APISO): 77 U/L (ref 35–144)
BUN: 10 mg/dL (ref 7–25)
CO2: 24 mmol/L (ref 20–32)
Calcium: 9.1 mg/dL (ref 8.6–10.3)
Chloride: 101 mmol/L (ref 98–110)
Creat: 0.75 mg/dL (ref 0.70–1.25)
Globulin: 2.9 g/dL (calc) (ref 1.9–3.7)
Glucose, Bld: 271 mg/dL — ABNORMAL HIGH (ref 65–99)
Potassium: 4.2 mmol/L (ref 3.5–5.3)
Sodium: 136 mmol/L (ref 135–146)
Total Bilirubin: 0.5 mg/dL (ref 0.2–1.2)
Total Protein: 6.9 g/dL (ref 6.1–8.1)

## 2019-03-30 LAB — PSA: PSA: 0.3 ng/mL (ref <=4.0)

## 2019-03-31 ENCOUNTER — Encounter: Payer: Self-pay | Admitting: Family Medicine

## 2019-04-18 ENCOUNTER — Encounter: Payer: Self-pay | Admitting: Endocrinology

## 2019-04-19 ENCOUNTER — Ambulatory Visit (INDEPENDENT_AMBULATORY_CARE_PROVIDER_SITE_OTHER): Payer: Medicare Other | Admitting: Endocrinology

## 2019-04-19 ENCOUNTER — Other Ambulatory Visit: Payer: Self-pay

## 2019-04-19 DIAGNOSIS — E0840 Diabetes mellitus due to underlying condition with diabetic neuropathy, unspecified: Secondary | ICD-10-CM

## 2019-04-19 DIAGNOSIS — Z794 Long term (current) use of insulin: Secondary | ICD-10-CM

## 2019-04-19 MED ORDER — TOUJEO MAX SOLOSTAR 300 UNIT/ML ~~LOC~~ SOPN: 190.0000 [IU] | PEN_INJECTOR | SUBCUTANEOUS | 11 refills | Status: DC

## 2019-04-19 NOTE — Patient Instructions (Addendum)
check your blood sugar twice a day.  vary the time of day when you check, between before the 3 meals, and at bedtime.  also check if you have symptoms of your blood sugar being too high or too low.  please keep a record of the readings and bring it to your next appointment here (or you can bring the meter itself).  You can write it on any piece of paper.  please call us sooner if your blood sugar goes below 70, or if you have a lot of readings over 200.  Please continue the same insulin.   Please come back for a follow-up appointment in 6 weeks.

## 2019-04-19 NOTE — Progress Notes (Signed)
   Subjective:    Patient ID: Kevin [name], male    DOB: 01/26/1956, 63 y.o.   MRN: [id number]  HPI  telehealth visit today via phone x 8 minutes.  Alternatives to telehealth are presented to this patient, and the patient agrees to the telehealth visit.   Pt is advised of the cost of the visit, and agrees to this, also.   Patient is at home, and I am at the office.   Persons attending the telehealth visit: the patient and I.   Pt returns for f/u of diabetes mellitus: DM type: Insulin-requiring type 2. Dx'ed: 6433 Complications: polyneuropathy Therapy: insulin since 2001, and metformin.  DKA: never Severe hypoglycemia: never.   Pancreatitis: never Pancreatic imaging: never Other: he is not a candidate for multiple daily injections, due to h/o noncompliance.   Interval Hx: pt states cbg's vary from 101-144.  pt states he feels well in general.  Pt says he never misses the insulin.  He takes 190 units qam.     Review of Systems He denies hypoglycemia.      Objective:   Physical Exam       Assessment & Plan:  Insulin-requiring type 2 DM, with PN: I advised pt to increase insulin, but he declines.    Patient Instructions  check your blood sugar twice a day.  vary the time of day when you check, between before the 3 meals, and at bedtime.  also check if you have symptoms of your blood sugar being too high or too low.  please keep a record of the readings and bring it to your next appointment here (or you can bring the meter itself).  You can write it on any piece of paper.  please call us sooner if your blood sugar goes below 70, or if you have a lot of readings over 200.  Please continue the same insulin.   Please come back for a follow-up appointment in 6 weeks.

## 2019-05-16 DIAGNOSIS — E1142 Type 2 diabetes mellitus with diabetic polyneuropathy: Secondary | ICD-10-CM | POA: Diagnosis not present

## 2019-05-16 DIAGNOSIS — L84 Corns and callosities: Secondary | ICD-10-CM | POA: Diagnosis not present

## 2019-05-16 DIAGNOSIS — M79676 Pain in unspecified toe(s): Secondary | ICD-10-CM | POA: Diagnosis not present

## 2019-05-16 DIAGNOSIS — B351 Tinea unguium: Secondary | ICD-10-CM | POA: Diagnosis not present

## 2019-06-14 ENCOUNTER — Encounter: Payer: Self-pay | Admitting: Endocrinology

## 2019-06-14 ENCOUNTER — Ambulatory Visit (INDEPENDENT_AMBULATORY_CARE_PROVIDER_SITE_OTHER): Payer: Medicare Other | Admitting: Endocrinology

## 2019-06-14 ENCOUNTER — Other Ambulatory Visit: Payer: Self-pay

## 2019-06-14 VITALS — Ht 68.0 in

## 2019-06-14 DIAGNOSIS — E0840 Diabetes mellitus due to underlying condition with diabetic neuropathy, unspecified: Secondary | ICD-10-CM

## 2019-06-14 DIAGNOSIS — E1142 Type 2 diabetes mellitus with diabetic polyneuropathy: Secondary | ICD-10-CM

## 2019-06-14 DIAGNOSIS — Z794 Long term (current) use of insulin: Secondary | ICD-10-CM

## 2019-06-14 NOTE — Progress Notes (Signed)
Subjective:    Patient ID: Kevin [name], male    DOB: 06/03/1956, 63 y.o.   MRN: [id number]  HPI telehealth visit today via phone x 6 minutes.  Alternatives to telehealth are presented to this patient, and the patient agrees to the telehealth visit.   Pt is advised of the cost of the visit, and agrees to this, also.   Patient is at home, and I am at the office.   Persons attending the telehealth visit: the patient and I.   Pt returns for f/u of diabetes mellitus: DM type: Insulin-requiring type 2. Dx'ed: [id number] Complications: polyneuropathy Therapy: insulin since 2001, and metformin.  DKA: never Severe hypoglycemia: never.   Pancreatitis: never Pancreatic imaging: never Other: he is not a candidate for multiple daily injections, due to h/o noncompliance.   Interval Hx: pt states cbg's vary from 102-191.  pt states he feels well in general.  Pt says he never misses the insulin.  He takes 190 units qam.   Past Medical History:  Diagnosis Date  . Arthritis   . Chronic knee pain   . Depression   . Diabetes mellitus ([place])   . Hyperlipidemia   . Hypertension   . Leg edema   . Obesity     Past Surgical History:  Procedure Laterality Date  . none      Social History   Socioeconomic History  . Marital status: Single    Spouse name: Not on file  . Number of children: 0  . Years of education: Not on file  . Highest education level: Not on file  Occupational History  . Not on file  Social Needs  . Financial resource strain: Not on file  . Food insecurity    Worry: Not on file    Inability: Not on file  . Transportation needs    Medical: Not on file    Non-medical: Not on file  Tobacco Use  . Smoking status: Current Every Day Smoker    Packs/day: 0.20  . Smokeless tobacco: Never Used  Substance and Sexual Activity  . Alcohol use: No  . Drug use: No  . Sexual activity: Not Currently  Lifestyle  . Physical activity    Days per week: Not on file    Minutes per  session: Not on file  . Stress: Not on file  Relationships  . Social Herbalist on phone: Not on file    Gets together: Not on file    Attends religious service: Not on file    Active member of club or organization: Not on file    Attends meetings of clubs or organizations: Not on file    Relationship status: Not on file  . Intimate partner violence    Fear of current or ex partner: Not on file    Emotionally abused: Not on file    Physically abused: Not on file    Forced sexual activity: Not on file  Other Topics Concern  . Not on file  Social History Narrative   Patient's POA is his sister, [name] [id number].            Current Outpatient Medications on File Prior to Visit  Medication Sig Dispense Refill  . amLODipine (NORVASC) 5 MG tablet TAKE (1) TABLET BY MOUTH ONCE DAILY FOR BLOOD PRESSURE. 90 tablet 3  . aspirin EC 81 MG tablet Take 81 mg by mouth daily.    . clotrimazole (LOTRIMIN) 1 % cream Apply  1 application topically 2 (two) times daily. 30 g 1  . DULoxetine (CYMBALTA) 30 MG capsule Take 1 capsule (30 mg total) by mouth daily. 30 capsule 3  . folic acid (FOLVITE) 1 MG tablet Take 1 tablet (1 mg total) by mouth daily. 90 tablet 3  . furosemide (LASIX) 40 MG tablet Take 1 tablet (40 mg total) by mouth 2 (two) times daily. For fluid 180 tablet 3  . gabapentin (NEURONTIN) 400 MG capsule Take 400 mg by mouth at bedtime.    . Glucose Blood (BLOOD GLUCOSE TEST STRIPS) STRP Use to monitor FSBS 3x daily. Dx: E11.65. 100 each 3  . Insulin Glargine, 2 Unit Dial, (TOUJEO MAX SOLOSTAR) 300 UNIT/ML SOPN Inject 190 Units into the skin every morning. 6 pen 11  . metFORMIN (GLUCOPHAGE) 1000 MG tablet TAKE 1 TABLET BY MOUTH TWICE DAILY WITH A MEAL FOR DIABETES. 180 tablet 2  . nitroGLYCERIN (NITROSTAT) 0.4 MG SL tablet DISSOLVE 1 TABLET UNDER TONGUE EVERY 5 MINUTES UP TO 15 MIN FOR CHESTPAIN. IF NO RELIEF CALL 911. 25 tablet 0  . potassium chloride (K-DUR) 10 MEQ tablet  Take 1 tablet (10 mEq total) by mouth 2 (two) times daily. Take with fluid pill 180 tablet 3  . propranolol ER (INDERAL LA) 60 MG 24 hr capsule Take 1 capsule (60 mg total) by mouth daily. 90 capsule 2  . rosuvastatin (CRESTOR) 10 MG tablet Take 1 tablet (10 mg total) by mouth daily. 90 tablet 3   No current facility-administered medications on file prior to visit.     Allergies  Allergen Reactions  . Lisinopril Cough    Family History  Problem Relation Age of Onset  . Heart disease Unknown   . Arthritis Unknown   . Diabetes Unknown   . Cancer Mother        deceased    Ht 5\' 8"  (1.727 m)   BMI 38.16 kg/m    Review of Systems She denies hypoglycemia    Objective:   Physical Exam    Lab Results  Component Value Date   HGBA1C 9.6 (H) 03/29/2019       Assessment & Plan:  Insulin-requiring type 2 DM, with PN: uncertain glycemic control  Patient Instructions  check your blood sugar twice a day.  vary the time of day when you check, between before the 3 meals, and at bedtime.  also check if you have symptoms of your blood sugar being too high or too low.  please keep a record of the readings and bring it to your next appointment here (or you can bring the meter itself).  You can write it on any piece of paper.  please call us sooner if your blood sugar goes below 70, or if you have a lot of readings over 200.  Please come in to have the A1c checked.  Please come back for a follow-up appointment in 2 months.

## 2019-06-14 NOTE — Patient Instructions (Addendum)
check your blood sugar twice a day.  vary the time of day when you check, between before the 3 meals, and at bedtime.  also check if you have symptoms of your blood sugar being too high or too low.  please keep a record of the readings and bring it to your next appointment here (or you can bring the meter itself).  You can write it on any piece of paper.  please call us sooner if your blood sugar goes below 70, or if you have a lot of readings over 200.  Please come in to have the A1c checked.  Please come back for a follow-up appointment in 2 months.

## 2019-07-29 ENCOUNTER — Other Ambulatory Visit: Payer: Self-pay

## 2019-07-30 ENCOUNTER — Encounter: Payer: Self-pay | Admitting: Family Medicine

## 2019-07-30 ENCOUNTER — Ambulatory Visit (INDEPENDENT_AMBULATORY_CARE_PROVIDER_SITE_OTHER): Payer: Medicare Other | Admitting: Family Medicine

## 2019-07-30 VITALS — BP 138/86 | HR 84 | Temp 98.1°F | Resp 12 | Ht 68.0 in | Wt 255.0 lb

## 2019-07-30 DIAGNOSIS — M170 Bilateral primary osteoarthritis of knee: Secondary | ICD-10-CM

## 2019-07-30 DIAGNOSIS — Z23 Encounter for immunization: Secondary | ICD-10-CM | POA: Diagnosis not present

## 2019-07-30 DIAGNOSIS — E782 Mixed hyperlipidemia: Secondary | ICD-10-CM | POA: Diagnosis not present

## 2019-07-30 DIAGNOSIS — Z550 Illiteracy and low-level literacy: Secondary | ICD-10-CM

## 2019-07-30 DIAGNOSIS — Z6838 Body mass index (BMI) 38.0-38.9, adult: Secondary | ICD-10-CM

## 2019-07-30 DIAGNOSIS — E0840 Diabetes mellitus due to underlying condition with diabetic neuropathy, unspecified: Secondary | ICD-10-CM | POA: Diagnosis not present

## 2019-07-30 DIAGNOSIS — E0843 Diabetes mellitus due to underlying condition with diabetic autonomic (poly)neuropathy: Secondary | ICD-10-CM

## 2019-07-30 DIAGNOSIS — Z794 Long term (current) use of insulin: Secondary | ICD-10-CM | POA: Diagnosis not present

## 2019-07-30 DIAGNOSIS — I10 Essential (primary) hypertension: Secondary | ICD-10-CM

## 2019-07-30 NOTE — Assessment & Plan Note (Addendum)
Diabetes with peripheral neuropathy.  He has follow-up with podiatry this week for nail trimming.  He is on Cymbalta and gabapentin. He is on high doses of insulin followed by endocrinology but in the setting of Covid has not had his labs drawn in 4 months I will obtain these today and then will forward them to his endocrinologist

## 2019-07-30 NOTE — Assessment & Plan Note (Signed)
Per reports he is back on his statin drug we will check his LDL expect his triglycerides will be elevated he states that he had cereal before the visit

## 2019-07-30 NOTE — Assessment & Plan Note (Signed)
He has known osteoarthritis and gait instability.  We will see if a lift chair can be ordered for him.  This may give him more independence within his home and also reduce falls as he is trying to get out of his seated position

## 2019-07-30 NOTE — Assessment & Plan Note (Signed)
Fairly well controlled.  No changes to medications.

## 2019-07-30 NOTE — Progress Notes (Signed)
Subjective:    Patient ID: Kevin [name], male    DOB: 09/01/1956, 63 y.o.   MRN: [id number]  Patient presents for Follow-up (is not fasting) and Lift Chair (requesting lift chair to assist with gettin up)    Pt here to f/u chronic medical problems    DM- last A1C 9.6%, injecting Tujeo 190units in the morning , He had telehealth visit done in Sept with Dr. Loanne [name]  he has appt in office in 2-3 months    Hyperlipidemia- staes she now has crestor at home    HTN- taking BP meds, but did not bring anything with     Requested lift chair , she has known DDD/OA Knees, he is not a good surgical candidate, has difficulty getting out of regular chair and the bed at times, requested a lift chair   States he is unable to urinate urine microalbumin   Review Of Systems:  GEN- denies fatigue, fever, weight loss,weakness, recent illness HEENT- denies eye drainage, change in vision, nasal discharge, CVS- denies chest pain, palpitations RESP- denies SOB, cough, wheeze ABD- denies N/V, change in stools, abd pain GU- denies dysuria, hematuria, dribbling, incontinence MSK- + joint pain, muscle aches, injury Neuro- denies headache, dizziness, syncope, seizure activity       Objective:    BP 138/86   Pulse 84   Temp 98.1 F (36.7 C) (Oral)   Resp 12   Ht 5\' 8"  (1.727 m)   Wt 255 lb (115.7 kg)   SpO2 97%   BMI 38.77 kg/m  GEN- NAD, alert and oriented x3,walking iwht cane  HEENT- PERRL, EOMI, non injected sclera, pink conjunctiva, MMM, oropharynx clear Neck- Supple, no thyromegaly CVS- RRR, no murmur RESP-CTAB EXT- No edema Pulses- Radial, DP- 2+        Assessment & Plan:      Problem List Items Addressed This Visit      Unprioritized   Diabetes mellitus with neurological manifestation ([place])    Diabetes with peripheral neuropathy.  He has follow-up with podiatry this week for nail trimming.  He is on Cymbalta and gabapentin. He is on high doses of insulin followed by  endocrinology but in the setting of Covid has not had his labs drawn in 4 months I will obtain these today and then will forward them to his endocrinologist      Relevant Orders   Hemoglobin A1c   Essential hypertension, benign - Primary    Fairly well controlled.  No changes to medications.      Relevant Orders   CBC with Differential/Platelet   Comprehensive metabolic panel   Hyperlipidemia    Per reports he is back on his statin drug we will check his LDL expect his triglycerides will be elevated he states that he had cereal before the visit      Relevant Orders   Lipid panel   Illiteracy and low-level literacy   Neuropathy, diabetic ([place])   Relevant Orders   Hemoglobin A1c   OA (osteoarthritis) of knee    He has known osteoarthritis and gait instability.  We will see if a lift chair can be ordered for him.  This may give him more independence within his home and also reduce falls as he is trying to get out of his seated position      Obesity    Other Visit Diagnoses    Need for immunization against influenza       Relevant Orders   Flu Vaccine QUAD  36+ mos IM (Completed)      Note: This dictation was prepared with Dragon dictation along with smaller phrase technology. Any transcriptional errors that result from this process are unintentional.

## 2019-07-30 NOTE — Patient Instructions (Signed)
F/u 4 months 

## 2019-07-31 LAB — CBC WITH DIFFERENTIAL/PLATELET
Absolute Monocytes: 872 cells/uL (ref 200–950)
Basophils Absolute: 120 cells/uL (ref 0–200)
Basophils Relative: 1.1 %
Eosinophils Absolute: 534 cells/uL — ABNORMAL HIGH (ref 15–500)
Eosinophils Relative: 4.9 %
HCT: 42.1 % (ref 38.5–50.0)
Hemoglobin: 14.8 g/dL (ref 13.2–17.1)
Lymphs Abs: 2899 cells/uL (ref 850–3900)
MCH: 32.9 pg (ref 27.0–33.0)
MCHC: 35.2 g/dL (ref 32.0–36.0)
MCV: 93.6 fL (ref 80.0–100.0)
MPV: 11.2 fL (ref 7.5–12.5)
Monocytes Relative: 8 %
Neutro Abs: 6475 cells/uL (ref 1500–7800)
Neutrophils Relative %: 59.4 %
Platelets: 195 10*3/uL (ref 140–400)
RBC: 4.5 10*6/uL (ref 4.20–5.80)
RDW: 11.4 % (ref 11.0–15.0)
Total Lymphocyte: 26.6 %
WBC: 10.9 10*3/uL — ABNORMAL HIGH (ref 3.8–10.8)

## 2019-07-31 LAB — LIPID PANEL
Cholesterol: 116 mg/dL (ref <200)
HDL: 34 mg/dL — ABNORMAL LOW (ref >=40)
LDL Cholesterol (Calc): 58 mg/dL (calc)
Non-HDL Cholesterol (Calc): 82 mg/dL (calc) (ref <130)
Total CHOL/HDL Ratio: 3.4 (calc) (ref <5.0)
Triglycerides: 158 mg/dL — ABNORMAL HIGH (ref <150)

## 2019-07-31 LAB — COMPREHENSIVE METABOLIC PANEL
AG Ratio: 1.2 (calc) (ref 1.0–2.5)
ALT: 26 U/L (ref 9–46)
AST: 32 U/L (ref 10–35)
Albumin: 3.7 g/dL (ref 3.6–5.1)
Alkaline phosphatase (APISO): 67 U/L (ref 35–144)
BUN: 8 mg/dL (ref 7–25)
CO2: 22 mmol/L (ref 20–32)
Calcium: 8.7 mg/dL (ref 8.6–10.3)
Chloride: 104 mmol/L (ref 98–110)
Creat: 0.73 mg/dL (ref 0.70–1.25)
Globulin: 3 g/dL (calc) (ref 1.9–3.7)
Glucose, Bld: 215 mg/dL — ABNORMAL HIGH (ref 65–99)
Potassium: 4.5 mmol/L (ref 3.5–5.3)
Sodium: 138 mmol/L (ref 135–146)
Total Bilirubin: 0.4 mg/dL (ref 0.2–1.2)
Total Protein: 6.7 g/dL (ref 6.1–8.1)

## 2019-07-31 LAB — HEMOGLOBIN A1C
Hgb A1c MFr Bld: 7.6 % of total Hgb — ABNORMAL HIGH (ref <5.7)
Mean Plasma Glucose: 171 (calc)
eAG (mmol/L): 9.5 (calc)

## 2019-08-01 ENCOUNTER — Encounter: Payer: Self-pay | Admitting: *Deleted

## 2019-08-01 DIAGNOSIS — B351 Tinea unguium: Secondary | ICD-10-CM | POA: Diagnosis not present

## 2019-08-01 DIAGNOSIS — L84 Corns and callosities: Secondary | ICD-10-CM | POA: Diagnosis not present

## 2019-08-01 DIAGNOSIS — E1142 Type 2 diabetes mellitus with diabetic polyneuropathy: Secondary | ICD-10-CM | POA: Diagnosis not present

## 2019-08-01 DIAGNOSIS — M79676 Pain in unspecified toe(s): Secondary | ICD-10-CM | POA: Diagnosis not present

## 2019-10-17 DIAGNOSIS — E1142 Type 2 diabetes mellitus with diabetic polyneuropathy: Secondary | ICD-10-CM | POA: Diagnosis not present

## 2019-10-17 DIAGNOSIS — M79676 Pain in unspecified toe(s): Secondary | ICD-10-CM | POA: Diagnosis not present

## 2019-10-17 DIAGNOSIS — B351 Tinea unguium: Secondary | ICD-10-CM | POA: Diagnosis not present

## 2019-10-17 DIAGNOSIS — L84 Corns and callosities: Secondary | ICD-10-CM | POA: Diagnosis not present

## 2019-10-21 ENCOUNTER — Ambulatory Visit (INDEPENDENT_AMBULATORY_CARE_PROVIDER_SITE_OTHER): Payer: Medicare Other | Admitting: Endocrinology

## 2019-10-21 ENCOUNTER — Encounter: Payer: Self-pay | Admitting: Endocrinology

## 2019-10-21 ENCOUNTER — Other Ambulatory Visit: Payer: Self-pay

## 2019-10-21 DIAGNOSIS — E0840 Diabetes mellitus due to underlying condition with diabetic neuropathy, unspecified: Secondary | ICD-10-CM

## 2019-10-21 DIAGNOSIS — Z794 Long term (current) use of insulin: Secondary | ICD-10-CM

## 2019-10-21 NOTE — Progress Notes (Signed)
   Subjective:    Patient ID: Kevin [name], male    DOB: 1955/11/26, 64 y.o.   MRN: [id number]  HPI telehealth visit today via phone x 6 minutes.  Alternatives to telehealth are presented to this patient, and the patient agrees to the telehealth visit.   Pt is advised of the cost of the visit, and agrees to this, also.   Patient is at home, and I am at the office.   Persons attending the telehealth visit: the patient and I.   Pt returns for f/u of diabetes mellitus: DM type: Insulin-requiring type 2. Dx'ed: [id number] Complications: polyneuropathy Therapy: insulin since 2001, and metformin.  DKA: never Severe hypoglycemia: never.   Pancreatitis: never Pancreatic imaging: never Other: he is not a candidate for multiple daily injections, due to h/o noncompliance.   Interval Hx: pt states cbg's vary from 90-130.  pt states he feels well in general.  Pt says he never misses the insulin.  He still takes 190 units qam.     Review of Systems He denies hypoglycemia.     Objective:   Physical Exam      Assessment & Plan:  Insulin-requiring type 2 DM: well-controlled per cbg's.    Patient Instructions  check your blood sugar twice a day.  vary the time of day when you check, between before the 3 meals, and at bedtime.  also check if you have symptoms of your blood sugar being too high or too low.  please keep a record of the readings and bring it to your next appointment here (or you can bring the meter itself).  You can write it on any piece of paper.  please call us sooner if your blood sugar goes below 70, or if you have a lot of readings over 200.  Please continue the same insulin.  Please come back for a follow-up appointment in 2 months, in person if possible.

## 2019-10-21 NOTE — Patient Instructions (Addendum)
check your blood sugar twice a day.  vary the time of day when you check, between before the 3 meals, and at bedtime.  also check if you have symptoms of your blood sugar being too high or too low.  please keep a record of the readings and bring it to your next appointment here (or you can bring the meter itself).  You can write it on any piece of paper.  please call us sooner if your blood sugar goes below 70, or if you have a lot of readings over 200.  Please continue the same insulin.  Please come back for a follow-up appointment in 2 months, in person if possible.

## 2019-11-27 IMAGING — DX DG FOOT COMPLETE 3+V*L*
3 series · 3 of 3 positions shown · non-contrast
Comparison: 03/28/2005

CLINICAL DATA: Acute LEFT foot pain following injury several days
ago. Initial encounter.

EXAM:
LEFT FOOT - COMPLETE 3+ VIEW

[foot ap]
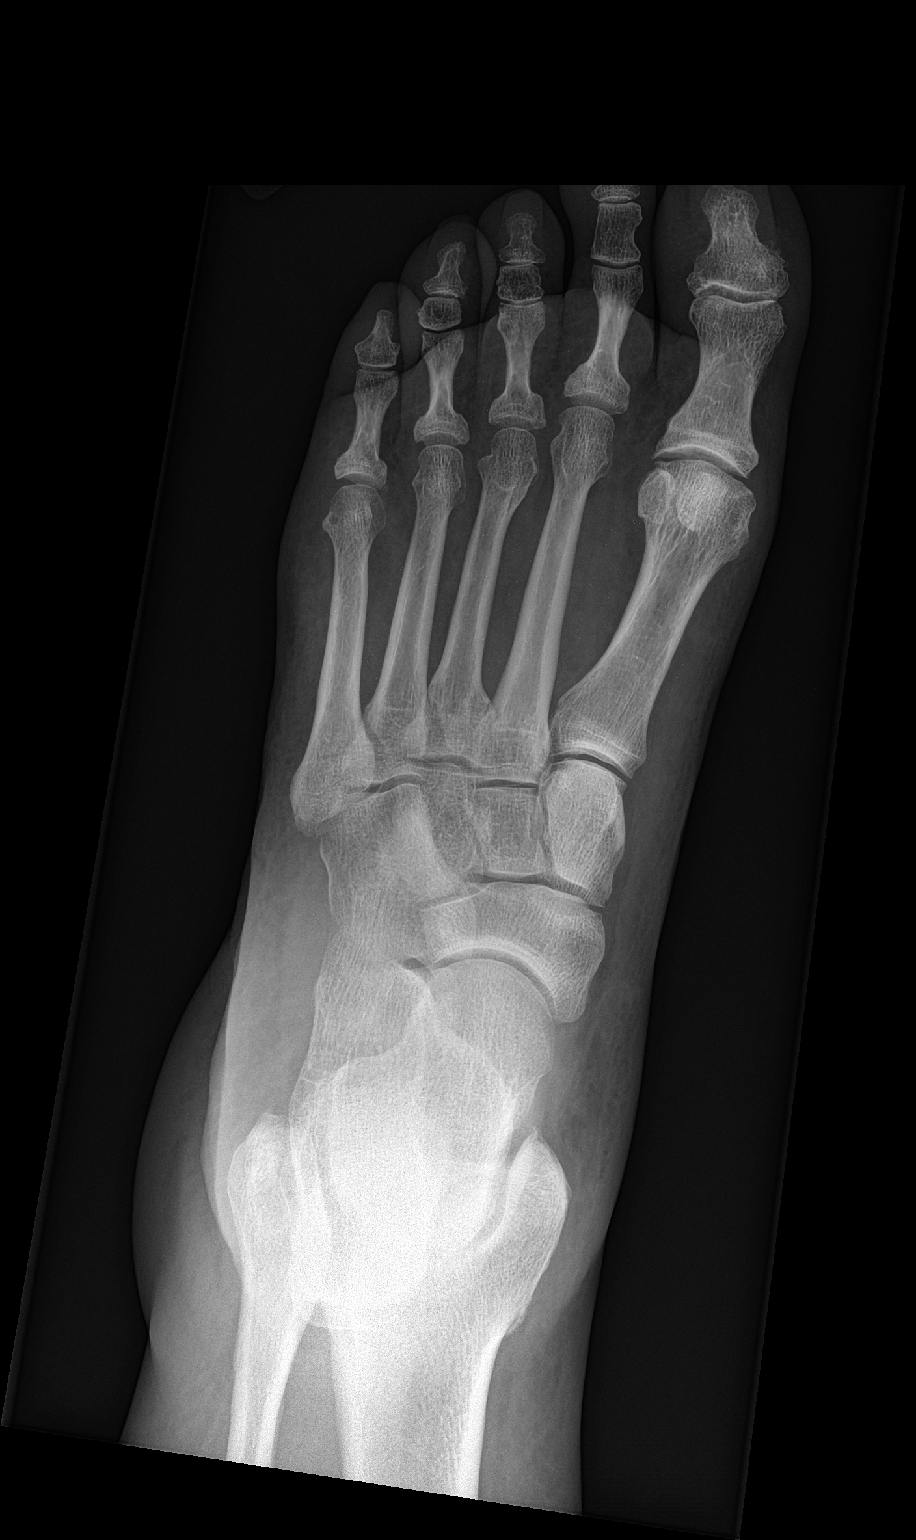

[foot obl]
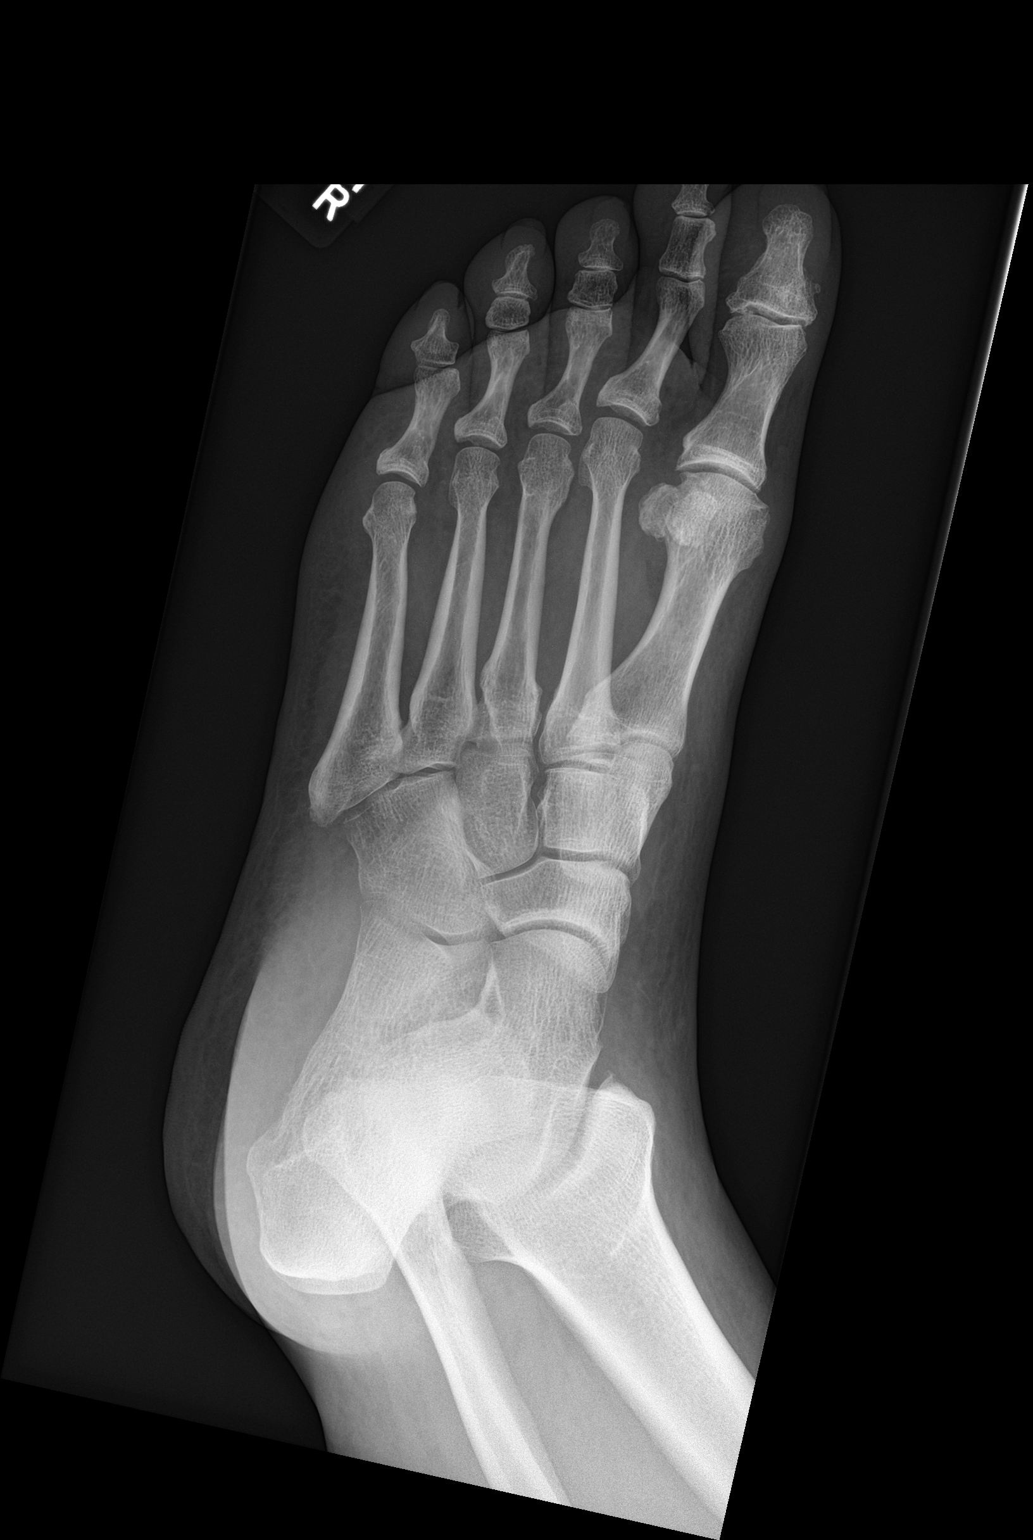

[foot lat]
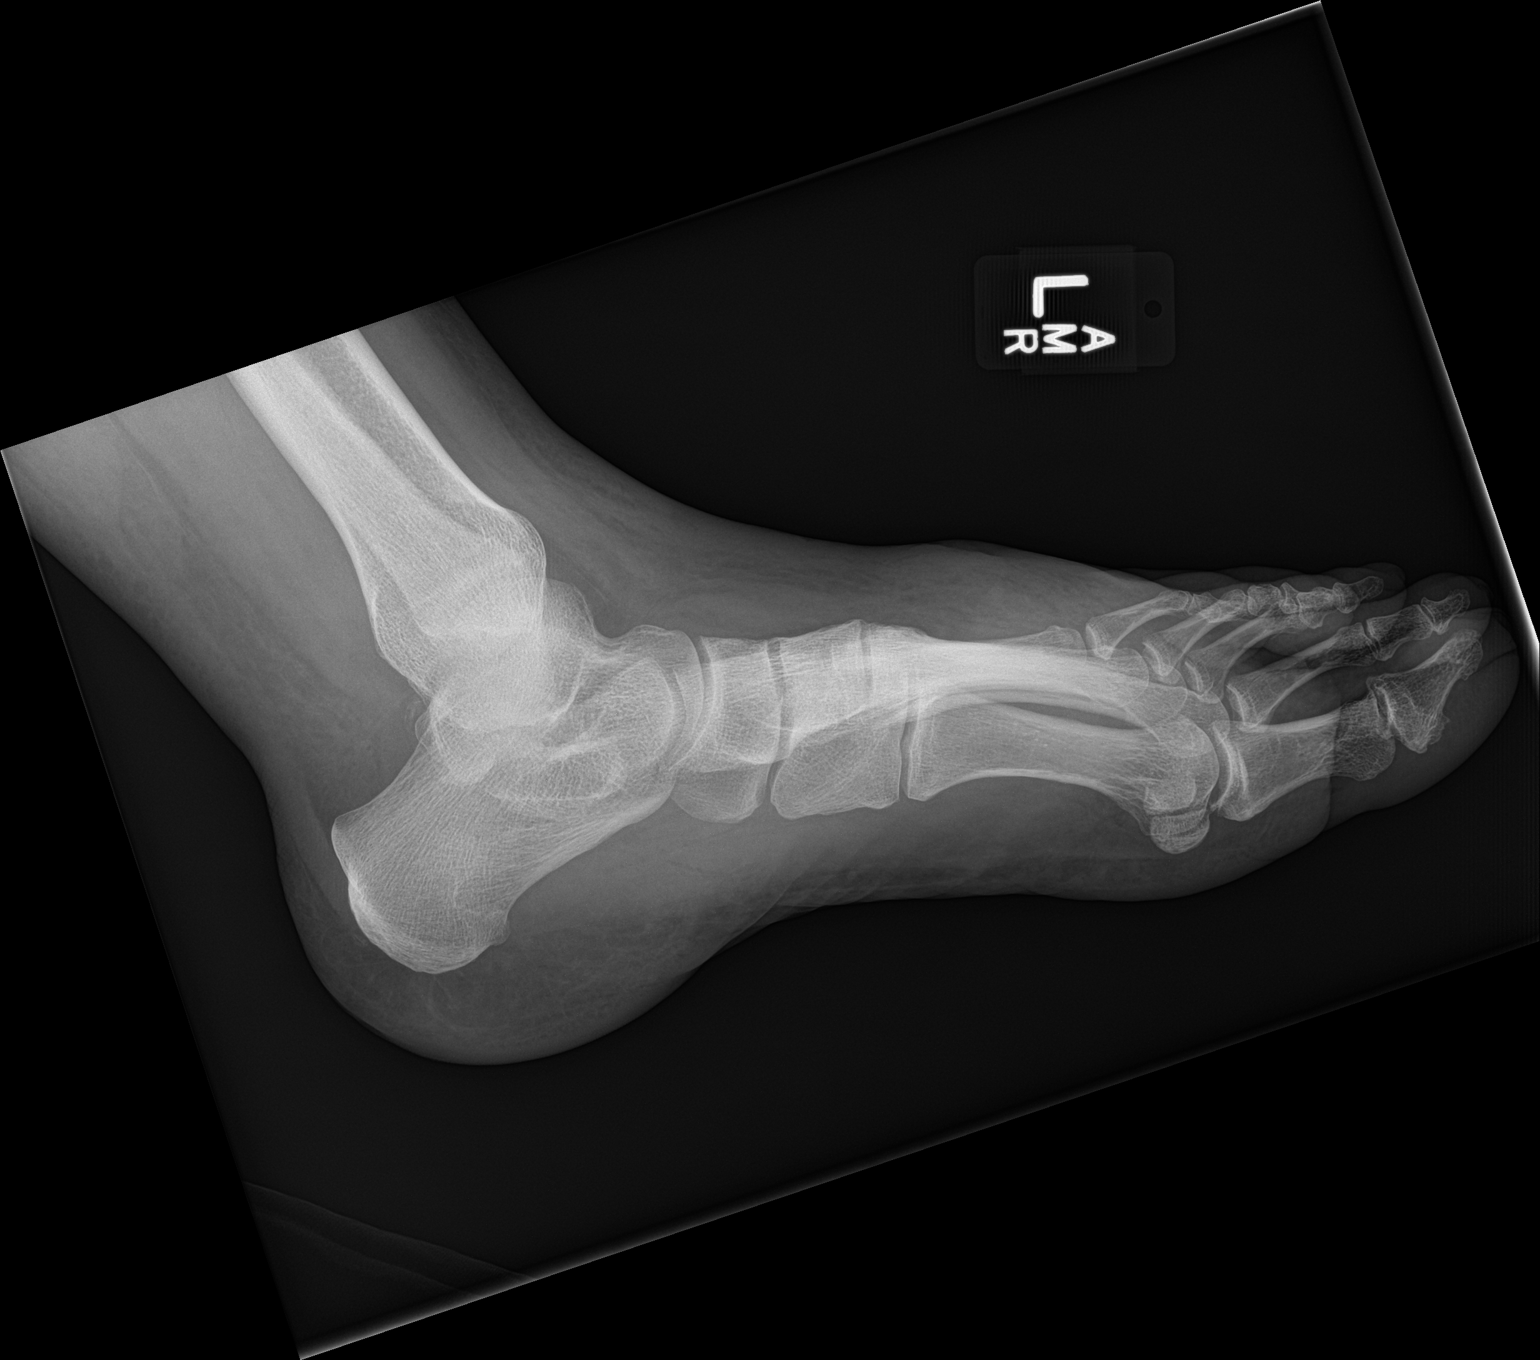

[3 of 3 positions shown; findings below may reference images not displayed]

FINDINGS: There is no evidence of acute fracture, subluxation or dislocation.

Dorsal soft tissue swelling is noted.

No radiopaque foreign body is identified.

No focal bony lesions are present.
IMPRESSION: Soft tissue swelling without acute bony abnormality.

## 2019-12-02 ENCOUNTER — Ambulatory Visit (INDEPENDENT_AMBULATORY_CARE_PROVIDER_SITE_OTHER): Payer: Medicare Other | Admitting: Family Medicine

## 2019-12-02 ENCOUNTER — Encounter: Payer: Self-pay | Admitting: Family Medicine

## 2019-12-02 VITALS — BP 142/84 | HR 84 | Temp 98.6°F | Resp 16 | Ht 68.0 in | Wt 260.0 lb

## 2019-12-02 DIAGNOSIS — E0840 Diabetes mellitus due to underlying condition with diabetic neuropathy, unspecified: Secondary | ICD-10-CM | POA: Diagnosis not present

## 2019-12-02 DIAGNOSIS — E782 Mixed hyperlipidemia: Secondary | ICD-10-CM

## 2019-12-02 DIAGNOSIS — Z794 Long term (current) use of insulin: Secondary | ICD-10-CM | POA: Diagnosis not present

## 2019-12-02 DIAGNOSIS — I10 Essential (primary) hypertension: Secondary | ICD-10-CM

## 2019-12-02 DIAGNOSIS — M5020 Other cervical disc displacement, unspecified cervical region: Secondary | ICD-10-CM

## 2019-12-02 DIAGNOSIS — Z550 Illiteracy and low-level literacy: Secondary | ICD-10-CM

## 2019-12-02 DIAGNOSIS — E0843 Diabetes mellitus due to underlying condition with diabetic autonomic (poly)neuropathy: Secondary | ICD-10-CM

## 2019-12-02 DIAGNOSIS — M5030 Other cervical disc degeneration, unspecified cervical region: Secondary | ICD-10-CM

## 2019-12-02 DIAGNOSIS — M170 Bilateral primary osteoarthritis of knee: Secondary | ICD-10-CM | POA: Diagnosis not present

## 2019-12-02 DIAGNOSIS — R202 Paresthesia of skin: Secondary | ICD-10-CM

## 2019-12-02 NOTE — Assessment & Plan Note (Signed)
Referral back for 2nd opinion regarding surgical intervention  Now diabetes is improved

## 2019-12-02 NOTE — Assessment & Plan Note (Signed)
A1C has been much improved Recheck labs in office today, since he has transportation issues, and will forward to endocrinology Continue insulin and metformin

## 2019-12-02 NOTE — Assessment & Plan Note (Signed)
Mild elevation today, no change in meds, continue to monitor

## 2019-12-02 NOTE — Patient Instructions (Addendum)
Referral to neck surgeon F/U 4 months for Physical

## 2019-12-02 NOTE — Progress Notes (Signed)
Subjective:    Patient ID: Kevin [name], male    DOB: September 21, 1956, 64 y.o.   MRN: [id number]  Patient presents for Follow-up (is not fasting)   Pt here to f/u chronic medical problems    DM- last A1C 7.6% in Oct , injecting Tujeo 190units in the morning  And metformin ,due for repeat labs    - needs urine micro - states he cant urinate today   He did not bring his meter today Diabetic neuropathy he is on gabapentin 400 mg at bedtime He is still following with podiatry    Hyperlipidemia- Taking Crestor , last LDL 58 ,TG 158 in Oct 2020    HTN- taking BP meds per report, no meds with him    has known DDD/OA Knees, he is not a good surgical candidate,  He has chronic neck pain with weakness in left arm, often drops items from left hand, had MRI and evaluation by Dr. Lorin Mercy in 2019 but diabetes uncontrolled and not a good surgical candidate at that time, he would like to be reassess but does not think Dr. Lorin Mercy takes his insurance    Review Of Systems:  GEN- denies fatigue, fever, weight loss,weakness, recent illness HEENT- denies eye drainage, change in vision, nasal discharge, CVS- denies chest pain, palpitations RESP- denies SOB, cough, wheeze ABD- denies N/V, change in stools, abd pain GU- denies dysuria, hematuria, dribbling, incontinence MSK-+ joint pain, muscle aches, injury Neuro- denies headache, dizziness, syncope, seizure activity       Objective:    BP (!) 142/84   Pulse 84   Temp 98.6 F (37 C) (Temporal)   Resp 16   Ht 5\' 8"  (1.727 m)   Wt 260 lb (117.9 kg)   SpO2 99%   BMI 39.53 kg/m  GEN- NAD, alert and oriented x3 HEENT- PERRL, EOMI, non injected sclera, pink conjunctiva, MMM, oropharynx clear Neck- Supple, no thyromegaly, fair ROM CVS- RRR, no murmur RESP-CTAB ABD-NABS,soft,NT,ND EXT- No edema Pulses- Radial, DP- 2+        Assessment & Plan:      Problem List Items Addressed This Visit      Unprioritized   DDD (degenerative  disc disease), cervical    Referral back for 2nd opinion regarding surgical intervention  Now diabetes is improved       Relevant Orders   Ambulatory referral to Orthopedic Surgery   Diabetes mellitus with neurological manifestation ([facility])    A1C has been much improved Recheck labs in office today, since he has transportation issues, and will forward to endocrinology Continue insulin and metformin       Relevant Orders   Hemoglobin A1c   Essential hypertension, benign - Primary    Mild elevation today, no change in meds, continue to monitor      Relevant Orders   CBC with Differential/Platelet   Comprehensive metabolic panel   Hyperlipidemia   Illiteracy and low-level literacy   Neuropathy, diabetic ([place])    Continued on gabapentin and cymbalta      OA (osteoarthritis) of knee   Protrusion of cervical intervertebral disc   Relevant Orders   Ambulatory referral to Orthopedic Surgery    Other Visit Diagnoses    Paresthesias in left hand       Relevant Orders   Ambulatory referral to Orthopedic Surgery      Note: This dictation was prepared with Dragon dictation along with smaller phrase technology. Any transcriptional errors that result from this process are unintentional.

## 2019-12-02 NOTE — Assessment & Plan Note (Signed)
Continued on gabapentin and cymbalta

## 2019-12-03 LAB — COMPREHENSIVE METABOLIC PANEL
AG Ratio: 1.3 (calc) (ref 1.0–2.5)
ALT: 26 U/L (ref 9–46)
AST: 29 U/L (ref 10–35)
Albumin: 4 g/dL (ref 3.6–5.1)
Alkaline phosphatase (APISO): 68 U/L (ref 35–144)
BUN: 10 mg/dL (ref 7–25)
CO2: 25 mmol/L (ref 20–32)
Calcium: 9 mg/dL (ref 8.6–10.3)
Chloride: 102 mmol/L (ref 98–110)
Creat: 0.75 mg/dL (ref 0.70–1.25)
Globulin: 3.1 g/dL (calc) (ref 1.9–3.7)
Glucose, Bld: 279 mg/dL — ABNORMAL HIGH (ref 65–99)
Potassium: 4.4 mmol/L (ref 3.5–5.3)
Sodium: 138 mmol/L (ref 135–146)
Total Bilirubin: 0.6 mg/dL (ref 0.2–1.2)
Total Protein: 7.1 g/dL (ref 6.1–8.1)

## 2019-12-03 LAB — CBC WITH DIFFERENTIAL/PLATELET
Absolute Monocytes: 696 cells/uL (ref 200–950)
Basophils Absolute: 118 cells/uL (ref 0–200)
Basophils Relative: 1.1 %
Eosinophils Absolute: 492 cells/uL (ref 15–500)
Eosinophils Relative: 4.6 %
HCT: 44.7 % (ref 38.5–50.0)
Hemoglobin: 15.4 g/dL (ref 13.2–17.1)
Lymphs Abs: 2825 cells/uL (ref 850–3900)
MCH: 32.4 pg (ref 27.0–33.0)
MCHC: 34.5 g/dL (ref 32.0–36.0)
MCV: 93.9 fL (ref 80.0–100.0)
MPV: 11.5 fL (ref 7.5–12.5)
Monocytes Relative: 6.5 %
Neutro Abs: 6570 cells/uL (ref 1500–7800)
Neutrophils Relative %: 61.4 %
Platelets: 161 10*3/uL (ref 140–400)
RBC: 4.76 10*6/uL (ref 4.20–5.80)
RDW: 11 % (ref 11.0–15.0)
Total Lymphocyte: 26.4 %
WBC: 10.7 10*3/uL (ref 3.8–10.8)

## 2019-12-03 LAB — HEMOGLOBIN A1C
Hgb A1c MFr Bld: 9.3 % of total Hgb — ABNORMAL HIGH (ref <5.7)
Mean Plasma Glucose: 220 (calc)
eAG (mmol/L): 12.2 (calc)

## 2019-12-04 ENCOUNTER — Other Ambulatory Visit: Payer: Self-pay

## 2019-12-04 ENCOUNTER — Telehealth: Payer: Self-pay

## 2019-12-04 DIAGNOSIS — E0840 Diabetes mellitus due to underlying condition with diabetic neuropathy, unspecified: Secondary | ICD-10-CM

## 2019-12-04 MED ORDER — TOUJEO MAX SOLOSTAR 300 UNIT/ML ~~LOC~~ SOPN: 200.0000 [IU] | PEN_INJECTOR | SUBCUTANEOUS | 11 refills | Status: DC

## 2019-12-04 NOTE — Telephone Encounter (Signed)
-----   Message from Renato Shin, MD sent at 12/03/2019  8:10 PM EST ----- please contact patient: Blood sugar is higher.  Please increase the insulin to 200 units each morning. I'll see you next time.

## 2019-12-04 NOTE — Telephone Encounter (Signed)
Pt returned call. Informed about new orders as indicated below. Verbalized acceptance and understanding.

## 2019-12-04 NOTE — Telephone Encounter (Signed)
Rx updated to reflect new orders:  Outpatient Medication Detail   Disp Refills Start End   Insulin Glargine, 2 Unit Dial, (TOUJEO MAX SOLOSTAR) 300 UNIT/ML SOPN 6 pen 11 12/04/2019    Sig - Route: Inject 200 Units into the skin every morning. - Subcutaneous   Class: No Print

## 2019-12-04 NOTE — Telephone Encounter (Signed)
Lab results reviewed by Dr. Ellison. Called pt to inform about lab results as well as new orders. LVM requesting returned call. 

## 2019-12-12 DIAGNOSIS — E119 Type 2 diabetes mellitus without complications: Secondary | ICD-10-CM | POA: Insufficient documentation

## 2019-12-12 DIAGNOSIS — M542 Cervicalgia: Secondary | ICD-10-CM | POA: Diagnosis not present

## 2019-12-12 DIAGNOSIS — I10 Essential (primary) hypertension: Secondary | ICD-10-CM | POA: Insufficient documentation

## 2019-12-12 DIAGNOSIS — M47812 Spondylosis without myelopathy or radiculopathy, cervical region: Secondary | ICD-10-CM | POA: Insufficient documentation

## 2019-12-18 ENCOUNTER — Other Ambulatory Visit: Payer: Self-pay

## 2019-12-19 ENCOUNTER — Ambulatory Visit (INDEPENDENT_AMBULATORY_CARE_PROVIDER_SITE_OTHER): Payer: Medicare Other | Admitting: Endocrinology

## 2019-12-19 DIAGNOSIS — E0840 Diabetes mellitus due to underlying condition with diabetic neuropathy, unspecified: Secondary | ICD-10-CM

## 2019-12-19 DIAGNOSIS — Z794 Long term (current) use of insulin: Secondary | ICD-10-CM | POA: Diagnosis not present

## 2019-12-19 MED ORDER — TRULICITY 0.75 MG/0.5ML ~~LOC~~ SOAJ: 0.7500 mg | SUBCUTANEOUS | 11 refills | Status: DC

## 2019-12-19 NOTE — Progress Notes (Signed)
Subjective:    Patient ID: Kevin [name], male    DOB: [birth year], 64 y.o.   MRN: [id number]  HPI telehealth visit today via phone x 7 minutes.  Alternatives to telehealth are presented to this patient, and the patient agrees to the telehealth visit.   Pt is advised of the cost of the visit, and agrees to this, also.   Patient is at home, and I am at the office.   Persons attending the telehealth visit: the patient and I.   Pt returns for f/u of diabetes mellitus: DM type: Insulin-requiring type 2. Dx'ed: [id number] Complications: PN Therapy: insulin since 2001, and metformin.  DKA: never Severe hypoglycemia: never.   Pancreatitis: never Pancreatic imaging: never Other: he is not a candidate for multiple daily injections, due to h/o noncompliance.   Interval Hx: pt states cbg's vary from 97-117.  pt states he feels well in general.  Pt says he never misses the insulin.  He still takes 200 units qam.   Past Medical History:  Diagnosis Date  . Arthritis   . Chronic knee pain   . Depression   . Diabetes mellitus ([place])   . Hyperlipidemia   . Hypertension   . Leg edema   . Obesity     Past Surgical History:  Procedure Laterality Date  . none      Social History   Socioeconomic History  . Marital status: Single    Spouse name: Not on file  . Number of children: 0  . Years of education: Not on file  . Highest education level: Not on file  Occupational History  . Not on file  Tobacco Use  . Smoking status: Current Every Day Smoker    Packs/day: 0.20  . Smokeless tobacco: Never Used  Substance and Sexual Activity  . Alcohol use: No  . Drug use: No  . Sexual activity: Not Currently  Other Topics Concern  . Not on file  Social History Narrative   Patient's POA is his sister, [name] [phone].           Social Determinants of Health   Financial Resource Strain:   . Difficulty of Paying Living Expenses:   Food Insecurity:   . Worried About Sales executive in the Last Year:   . Arboriculturist in the Last Year:   Transportation Needs:   . Film/video editor (Medical):   [name] Lack of Transportation (Non-Medical):   Physical Activity:   . Days of Exercise per Week:   . Minutes of Exercise per Session:   Stress:   . Feeling of Stress :   Social Connections:   . Frequency of Communication with Friends and Family:   . Frequency of Social Gatherings with Friends and Family:   . Attends Religious Services:   . Active Member of Clubs or Organizations:   . Attends Archivist Meetings:   [name] Marital Status:   Intimate Partner Violence:   . Fear of Current or Ex-Partner:   . Emotionally Abused:   [name] Physically Abused:   . Sexually Abused:     Current Outpatient Medications on File Prior to Visit  Medication Sig Dispense Refill  . amLODipine (NORVASC) 5 MG tablet TAKE (1) TABLET BY MOUTH ONCE DAILY FOR BLOOD PRESSURE. 90 tablet 3  . aspirin EC 81 MG tablet Take 81 mg by mouth daily.    . clotrimazole (LOTRIMIN) 1 % cream Apply 1 application topically 2 (two)  times daily. 30 g 1  . DULoxetine (CYMBALTA) 30 MG capsule Take 1 capsule (30 mg total) by mouth daily. 30 capsule 3  . folic acid (FOLVITE) 1 MG tablet Take 1 tablet (1 mg total) by mouth daily. 90 tablet 3  . furosemide (LASIX) 40 MG tablet Take 1 tablet (40 mg total) by mouth 2 (two) times daily. For fluid 180 tablet 3  . gabapentin (NEURONTIN) 400 MG capsule Take 400 mg by mouth at bedtime.    . Glucose Blood (BLOOD GLUCOSE TEST STRIPS) STRP Use to monitor FSBS 3x daily. Dx: E11.65. 100 each 3  . Insulin Glargine, 2 Unit Dial, (TOUJEO MAX SOLOSTAR) 300 UNIT/ML SOPN Inject 200 Units into the skin every morning. 6 pen 11  . metFORMIN (GLUCOPHAGE) 1000 MG tablet TAKE 1 TABLET BY MOUTH TWICE DAILY WITH A MEAL FOR DIABETES. 180 tablet 2  . nitroGLYCERIN (NITROSTAT) 0.4 MG SL tablet DISSOLVE 1 TABLET UNDER TONGUE EVERY 5 MINUTES UP TO 15 MIN FOR CHESTPAIN. IF NO RELIEF CALL 911.  25 tablet 0  . potassium chloride (K-DUR) 10 MEQ tablet Take 1 tablet (10 mEq total) by mouth 2 (two) times daily. Take with fluid pill 180 tablet 3  . propranolol ER (INDERAL LA) 60 MG 24 hr capsule Take 1 capsule (60 mg total) by mouth daily. 90 capsule 2  . rosuvastatin (CRESTOR) 10 MG tablet Take 1 tablet (10 mg total) by mouth daily. 90 tablet 3   No current facility-administered medications on file prior to visit.    Allergies  Allergen Reactions  . Lisinopril Cough    Family History  Problem Relation Age of Onset  . Heart disease Unknown   . Arthritis Unknown   . Diabetes Unknown   . Cancer Mother        deceased    There were no vitals taken for this visit.  Review of Systems He denies hypoglycemia    Objective:   Physical Exam    Lab Results  Component Value Date   HGBA1C 9.3 (H) 12/02/2019   Lab Results  Component Value Date   CREATININE 0.75 12/02/2019   BUN 10 12/02/2019   NA 138 12/02/2019   K 4.4 12/02/2019   CL 102 12/02/2019   CO2 25 12/02/2019       Assessment & Plan:  Type 2 DM: he needs increased rx.    Patient Instructions  check your blood sugar twice a day.  vary the time of day when you check, between before the 3 meals, and at bedtime.  also check if you have symptoms of your blood sugar being too high or too low.  please keep a record of the readings and bring it to your next appointment here (or you can bring the meter itself).  You can write it on any piece of paper.  please call us sooner if your blood sugar goes below 70, or if you have a lot of readings over 200.  I have sent a prescription to your pharmacy, to add "Trulicity."  Please continue the same insulin.  Please come back for a follow-up appointment in 1 month, in person if possible.

## 2019-12-19 NOTE — Patient Instructions (Addendum)
check your blood sugar twice a day.  vary the time of day when you check, between before the 3 meals, and at bedtime.  also check if you have symptoms of your blood sugar being too high or too low.  please keep a record of the readings and bring it to your next appointment here (or you can bring the meter itself).  You can write it on any piece of paper.  please call us sooner if your blood sugar goes below 70, or if you have a lot of readings over 200.  I have sent a prescription to your pharmacy, to add "Trulicity."  Please continue the same insulin.  Please come back for a follow-up appointment in 1 month, in person if possible.

## 2019-12-26 DIAGNOSIS — M79676 Pain in unspecified toe(s): Secondary | ICD-10-CM | POA: Diagnosis not present

## 2019-12-26 DIAGNOSIS — E1142 Type 2 diabetes mellitus with diabetic polyneuropathy: Secondary | ICD-10-CM | POA: Diagnosis not present

## 2019-12-26 DIAGNOSIS — L84 Corns and callosities: Secondary | ICD-10-CM | POA: Diagnosis not present

## 2019-12-26 DIAGNOSIS — B351 Tinea unguium: Secondary | ICD-10-CM | POA: Diagnosis not present

## 2020-03-05 DIAGNOSIS — M79676 Pain in unspecified toe(s): Secondary | ICD-10-CM | POA: Diagnosis not present

## 2020-03-05 DIAGNOSIS — L84 Corns and callosities: Secondary | ICD-10-CM | POA: Diagnosis not present

## 2020-03-05 DIAGNOSIS — B351 Tinea unguium: Secondary | ICD-10-CM | POA: Diagnosis not present

## 2020-03-05 DIAGNOSIS — E1142 Type 2 diabetes mellitus with diabetic polyneuropathy: Secondary | ICD-10-CM | POA: Diagnosis not present

## 2020-03-11 ENCOUNTER — Other Ambulatory Visit: Payer: Self-pay | Admitting: Family Medicine

## 2020-03-31 ENCOUNTER — Ambulatory Visit (INDEPENDENT_AMBULATORY_CARE_PROVIDER_SITE_OTHER): Payer: Medicare Other | Admitting: Family Medicine

## 2020-03-31 ENCOUNTER — Other Ambulatory Visit: Payer: Self-pay

## 2020-03-31 VITALS — BP 136/82 | HR 84 | Temp 98.1°F | Resp 16 | Ht 68.0 in | Wt 245.0 lb

## 2020-03-31 DIAGNOSIS — R0781 Pleurodynia: Secondary | ICD-10-CM | POA: Diagnosis not present

## 2020-03-31 DIAGNOSIS — S91209A Unspecified open wound of unspecified toe(s) with damage to nail, initial encounter: Secondary | ICD-10-CM | POA: Diagnosis not present

## 2020-03-31 DIAGNOSIS — Z0001 Encounter for general adult medical examination with abnormal findings: Secondary | ICD-10-CM

## 2020-03-31 DIAGNOSIS — M79674 Pain in right toe(s): Secondary | ICD-10-CM | POA: Diagnosis not present

## 2020-03-31 DIAGNOSIS — Z0000 Encounter for general adult medical examination without abnormal findings: Secondary | ICD-10-CM

## 2020-03-31 DIAGNOSIS — Z125 Encounter for screening for malignant neoplasm of prostate: Secondary | ICD-10-CM

## 2020-03-31 DIAGNOSIS — Z794 Long term (current) use of insulin: Secondary | ICD-10-CM | POA: Diagnosis not present

## 2020-03-31 DIAGNOSIS — E0840 Diabetes mellitus due to underlying condition with diabetic neuropathy, unspecified: Secondary | ICD-10-CM

## 2020-03-31 DIAGNOSIS — W19XXXA Unspecified fall, initial encounter: Secondary | ICD-10-CM

## 2020-03-31 DIAGNOSIS — I10 Essential (primary) hypertension: Secondary | ICD-10-CM

## 2020-03-31 DIAGNOSIS — E0843 Diabetes mellitus due to underlying condition with diabetic autonomic (poly)neuropathy: Secondary | ICD-10-CM

## 2020-03-31 MED ORDER — SULFAMETHOXAZOLE-TRIMETHOPRIM 800-160 MG PO TABS
1.0000 | ORAL_TABLET | Freq: Two times a day (BID) | ORAL | 0 refills | Status: DC
Start: 2020-03-31 — End: 2020-08-07

## 2020-03-31 MED ORDER — HYDROCODONE-ACETAMINOPHEN 5-325 MG PO TABS: 1.0000 | ORAL_TABLET | Freq: Four times a day (QID) | ORAL | 0 refills | Status: DC | PRN

## 2020-03-31 NOTE — Patient Instructions (Addendum)
Referral to eye doctor  We will call with lab results  F/U 4 months

## 2020-03-31 NOTE — Progress Notes (Signed)
Subjective:   Patient presents for Medicare Annual/Subsequent preventive examination.    Early this AM he had a fall in the middle of the night he had a pipe that burst in his home and there is some buckling of the wound.  He states that he got up in the middle the night and he tripped and fell onto his right side.  He also stumped his toe.  He would like for me to check both these areas.  He has significant tenderness on the right lower frontal ribs.  He does not have any bruising on the skin.  He states that his toe is bleeding.  HTN-  He is taking bp meds per report   DM- folowed by endocrnology, he is supposed to be on  Trulicity weekly and metformin and Tresiba, but he states he is only taking Trulicity?? Reviewed endocrinology note    Last A1 was  9.3%  DDD C spine,numbness tingling in left hand- send by Dr. Rolena Infante , he was unable to do MRI due to being claustrophobia , this is being rescheduled in an open MRI        Review Past Medical/Family/Social: Per EMR      Risk Factors  Current exercise habits:  Dietary issues discussed:   Cardiac risk factors: Obesity (BMI >= 30 kg/m2).   Depression Screen  (Note: if answer to either of the following is "Yes", a more complete depression screening is indicated)  Over the past two weeks, have you felt down, depressed or hopeless? No Over the past two weeks, have you felt little interest or pleasure in doing things? No Have you lost interest or pleasure in daily life? No Do you often feel hopeless? No Do you cry easily over simple problems? No   Activities of Daily Living  In your present state of health, do you have any difficulty performing the following activities?:  Driving? No  Managing money? No  Feeding yourself? No  Getting from bed to chair? No  Climbing a flight of stairs? No  Preparing food and eating?: No  Bathing or showering? No  Getting dressed: No  Getting to the toilet? No  Using the toilet:No  Moving  around from place to place: No  In the past year have you fallen or had a near fall?:Yes  Are you sexually active? No  Do you have more than one partner? No   Hearing Difficulties: No  Do you often ask people to speak up or repeat themselves? No  Do you experience ringing or noises in your ears? No Do you have difficulty understanding soft or whispered voices? No  Do you feel that you have a problem with memory? No Do you often misplace items? No  Do you feel safe at home? Yes  Cognitive Testing  Alert? Yes Normal Appearance?Yes  Oriented to person? Yes Place? Yes  Time? Yes  Recall of three objects? Yes  Can perform simple calculations? Yes  Displays appropriate judgment?Yes  Can read the correct time from a watch face?Yes   List the Names of Other Physician/Practitioners you currently use:   [facility]   Screening Tests / Date Colonoscopy   -annual fecal occult negative               Zostavax  Due  PNA- UTD  Influenza Vaccine  UTD  Tetanus/tdap Due   ROS:  GEN- denies fatigue, fever, weight loss,weakness, recent illness HEENT- denies eye drainage, change in vision, nasal discharge,  CVS- denies chest pain, palpitations RESP- denies SOB, cough, wheeze ABD- denies N/V, change in stools, abd pain GU- denies dysuria, hematuria, dribbling, incontinence MSK- + joint pain, muscle aches, injury Neuro- denies headache, dizziness, syncope, seizure activity  PHYSICAL- vitals reviewed  GEN- NAD, alert and oriented x3 HEENT- PERRL, EOMI, non injected sclera, pink conjunctiva, MMM, oropharynx clear Neck- Supple, no thryomegaly, fair ROM, C spine NT  CVS- RRR, no murmur RESP-CTAB Chest wall- ttp right lower ribs ABD-NABS,soft,NT,ND Pysch- normal affect and mood EXT- mild ankle edema, Right great toe partial nail avulsed very thick nail, dry blood with bruising, mild eryhema, no pus, TTP over entire toe, pain with movement of toe  Pulses- Radial, DP-  palpated    Assessment:    Annual wellness medicare exam   Plan:    During the course of the visit the patient was educated and counseled about appropriate screening and preventive services including:    Immunizations- discussed COVID-19 vaccine, pt will get from pharmacy  DM- uncontrolled, advised he should be on Tujeo and the trulicity and metformin, seems he was quite confused.  His A1c resulted at 10.4%.  We will restart him back on Metformin as well as Tujeo 30 units until he sees his endocrinologist   - referred to Eye doctor   HTn- no changes   Hyperlipidemia- continue crestor  S/P FALL concern for rib fracture, toe fracture, obtain xrays  short term norco given  called podiatry for partial avulsion of a very hypertrophic nail, but they can not see him until July  Will have him soak in epson salt, covered with antibiotics  keep bandaged until then   Diabetic neuropathy on cymbalta and gabapentin  PSA discussed   Smoker not ready to quit   Class 3 obesity (morbid)  Associated with DM, HTN, hyperlipidemia  , he has had some weight loss in past few months    Pt declines colonoscopy, prefers annual FIT with health insurance      CAGE /Depresson screen neg  Moderate Fall Risk               Diet review for nutrition referral? Yes ____ Not Indicated __x__  Patient Instructions (the written plan) was given to the patient.  Medicare Attestation  I have personally reviewed:  The patient's medical and social history  Their use of alcohol, tobacco or illicit drugs  Their current medications and supplements  The patient's functional ability including ADLs,fall risks, home safety risks, cognitive, and hearing and visual impairment  Diet and physical activities  Evidence for depression or mood disorders  The patient's weight, height, BMI, and visual acuity have been recorded in the chart. I have made referrals, counseling, and provided education to the patient  based on review of the above and I have provided the patient with a written personalized care plan for preventive services.

## 2020-04-01 ENCOUNTER — Ambulatory Visit (HOSPITAL_COMMUNITY)
Admission: RE | Admit: 2020-04-01 | Discharge: 2020-04-01 | Disposition: A | Discharge status: Home / Self Care | Payer: Medicare Other | Source: Ambulatory Visit | Attending: Family Medicine | Admitting: Family Medicine

## 2020-04-01 ENCOUNTER — Encounter: Payer: Self-pay | Admitting: Family Medicine

## 2020-04-01 DIAGNOSIS — R0781 Pleurodynia: Secondary | ICD-10-CM | POA: Diagnosis not present

## 2020-04-01 DIAGNOSIS — S299XXA Unspecified injury of thorax, initial encounter: Secondary | ICD-10-CM | POA: Diagnosis not present

## 2020-04-01 DIAGNOSIS — W19XXXA Unspecified fall, initial encounter: Secondary | ICD-10-CM | POA: Diagnosis not present

## 2020-04-01 DIAGNOSIS — S91209A Unspecified open wound of unspecified toe(s) with damage to nail, initial encounter: Secondary | ICD-10-CM | POA: Diagnosis not present

## 2020-04-01 DIAGNOSIS — S99921A Unspecified injury of right foot, initial encounter: Secondary | ICD-10-CM | POA: Diagnosis not present

## 2020-04-01 DIAGNOSIS — M79674 Pain in right toe(s): Secondary | ICD-10-CM

## 2020-04-01 LAB — CBC WITH DIFFERENTIAL/PLATELET
Absolute Monocytes: 910 cells/uL (ref 200–950)
Basophils Absolute: 135 cells/uL (ref 0–200)
Basophils Relative: 1.1 %
Eosinophils Absolute: 443 cells/uL (ref 15–500)
Eosinophils Relative: 3.6 %
HCT: 49.1 % (ref 38.5–50.0)
Hemoglobin: 17 g/dL (ref 13.2–17.1)
Lymphs Abs: 2927 cells/uL (ref 850–3900)
MCH: 32.5 pg (ref 27.0–33.0)
MCHC: 34.6 g/dL (ref 32.0–36.0)
MCV: 93.9 fL (ref 80.0–100.0)
MPV: 10.7 fL (ref 7.5–12.5)
Monocytes Relative: 7.4 %
Neutro Abs: 7884 cells/uL — ABNORMAL HIGH (ref 1500–7800)
Neutrophils Relative %: 64.1 %
Platelets: 208 10*3/uL (ref 140–400)
RBC: 5.23 10*6/uL (ref 4.20–5.80)
RDW: 11.3 % (ref 11.0–15.0)
Total Lymphocyte: 23.8 %
WBC: 12.3 10*3/uL — ABNORMAL HIGH (ref 3.8–10.8)

## 2020-04-01 LAB — COMPREHENSIVE METABOLIC PANEL
AG Ratio: 1.3 (calc) (ref 1.0–2.5)
ALT: 22 U/L (ref 9–46)
AST: 20 U/L (ref 10–35)
Albumin: 4.3 g/dL (ref 3.6–5.1)
Alkaline phosphatase (APISO): 81 U/L (ref 35–144)
BUN: 11 mg/dL (ref 7–25)
CO2: 22 mmol/L (ref 20–32)
Calcium: 9.7 mg/dL (ref 8.6–10.3)
Chloride: 99 mmol/L (ref 98–110)
Creat: 0.84 mg/dL (ref 0.70–1.25)
Globulin: 3.2 g/dL (calc) (ref 1.9–3.7)
Glucose, Bld: 254 mg/dL — ABNORMAL HIGH (ref 65–99)
Potassium: 4.4 mmol/L (ref 3.5–5.3)
Sodium: 134 mmol/L — ABNORMAL LOW (ref 135–146)
Total Bilirubin: 0.6 mg/dL (ref 0.2–1.2)
Total Protein: 7.5 g/dL (ref 6.1–8.1)

## 2020-04-01 LAB — HEMOGLOBIN A1C
Hgb A1c MFr Bld: 10.4 % of total Hgb — ABNORMAL HIGH (ref <5.7)
Mean Plasma Glucose: 252 (calc)
eAG (mmol/L): 13.9 (calc)

## 2020-04-01 LAB — PSA: PSA: 0.2 ng/mL (ref <=4.0)

## 2020-04-01 LAB — LDL CHOLESTEROL, DIRECT: Direct LDL: 52 mg/dL (ref <100)

## 2020-04-02 ENCOUNTER — Other Ambulatory Visit: Payer: Self-pay | Admitting: *Deleted

## 2020-04-02 DIAGNOSIS — E0840 Diabetes mellitus due to underlying condition with diabetic neuropathy, unspecified: Secondary | ICD-10-CM

## 2020-04-02 DIAGNOSIS — Z794 Long term (current) use of insulin: Secondary | ICD-10-CM

## 2020-04-02 MED ORDER — TOUJEO MAX SOLOSTAR 300 UNIT/ML ~~LOC~~ SOPN: 30.0000 [IU] | PEN_INJECTOR | SUBCUTANEOUS | 11 refills | Status: DC

## 2020-04-03 ENCOUNTER — Telehealth: Payer: Self-pay

## 2020-04-03 NOTE — Telephone Encounter (Signed)
-----   Message from Renato Shin, MD sent at 04/02/2020  5:24 PM EDT ----- please contact patient: F/u ov is due

## 2020-04-03 NOTE — Telephone Encounter (Signed)
RESULTS  Results were reviewed by Dr. Ellison. A letter has been mailed to pt home address. For future reference, letter can be found in Epic. 

## 2020-04-14 ENCOUNTER — Other Ambulatory Visit: Payer: Self-pay | Admitting: Family Medicine

## 2020-04-20 ENCOUNTER — Other Ambulatory Visit: Payer: Self-pay | Admitting: Endocrinology

## 2020-04-20 ENCOUNTER — Other Ambulatory Visit: Payer: Self-pay | Admitting: *Deleted

## 2020-04-20 MED ORDER — HYDROCODONE-ACETAMINOPHEN 5-325 MG PO TABS: 1.0000 | ORAL_TABLET | Freq: Four times a day (QID) | ORAL | 0 refills | Status: DC | PRN

## 2020-04-20 NOTE — Telephone Encounter (Signed)
Received call from patient.   Requested refill on Hydrocodone/APAP.  Ok to refill??  Last office visit/  refill 03/31/2020.

## 2020-05-06 ENCOUNTER — Ambulatory Visit: Payer: Medicare Other | Admitting: Endocrinology

## 2020-05-14 ENCOUNTER — Other Ambulatory Visit: Payer: Self-pay | Admitting: *Deleted

## 2020-05-14 DIAGNOSIS — B351 Tinea unguium: Secondary | ICD-10-CM | POA: Diagnosis not present

## 2020-05-14 DIAGNOSIS — L03031 Cellulitis of right toe: Secondary | ICD-10-CM | POA: Diagnosis not present

## 2020-05-14 NOTE — Telephone Encounter (Signed)
Received VM from patient.   Requested refill on Hydrocodone/APAP.   Ok to refill??  Last office visit 03/31/2020.  Last refill 04/20/2020.

## 2020-05-15 MED ORDER — HYDROCODONE-ACETAMINOPHEN 5-325 MG PO TABS: 1.0000 | ORAL_TABLET | Freq: Four times a day (QID) | ORAL | 0 refills | Status: DC | PRN

## 2020-05-18 ENCOUNTER — Other Ambulatory Visit: Payer: Self-pay | Admitting: Endocrinology

## 2020-05-29 ENCOUNTER — Encounter: Payer: Self-pay | Admitting: Endocrinology

## 2020-05-29 ENCOUNTER — Other Ambulatory Visit: Payer: Self-pay

## 2020-05-29 ENCOUNTER — Ambulatory Visit (INDEPENDENT_AMBULATORY_CARE_PROVIDER_SITE_OTHER): Payer: Medicare Other | Admitting: Endocrinology

## 2020-05-29 VITALS — BP 106/62 | HR 75 | Ht 68.0 in | Wt 238.6 lb

## 2020-05-29 DIAGNOSIS — Z794 Long term (current) use of insulin: Secondary | ICD-10-CM | POA: Diagnosis not present

## 2020-05-29 DIAGNOSIS — E0840 Diabetes mellitus due to underlying condition with diabetic neuropathy, unspecified: Secondary | ICD-10-CM

## 2020-05-29 DIAGNOSIS — E1142 Type 2 diabetes mellitus with diabetic polyneuropathy: Secondary | ICD-10-CM | POA: Diagnosis not present

## 2020-05-29 LAB — POCT GLYCOSYLATED HEMOGLOBIN (HGB A1C): Hemoglobin A1C: 7.6 % — AB (ref 4.0–5.6)

## 2020-05-29 MED ORDER — TRULICITY 1.5 MG/0.5ML ~~LOC~~ SOAJ: 1.5000 mg | SUBCUTANEOUS | 3 refills | Status: DC

## 2020-05-29 NOTE — Patient Instructions (Addendum)
check your blood sugar twice a day.  vary the time of day when you check, between before the 3 meals, and at bedtime.  also check if you have symptoms of your blood sugar being too high or too low.  please keep a record of the readings and bring it to your next appointment here (or you can bring the meter itself).  You can write it on any piece of paper.  please call us sooner if your blood sugar goes below 70, or if you have a lot of readings over 200.  I have sent a prescription to your pharmacy, to increase the Trulicity. Please continue the same metformin Please come back for a follow-up appointment in 3-4 months.

## 2020-05-29 NOTE — Progress Notes (Signed)
Subjective:    Patient ID: Kevin [name], male    DOB: [birth year], 64 y.o.   MRN: [id number]  HPI Pt returns for f/u of diabetes mellitus: DM type: 2 Dx'ed: 6967 Complications: PN Therapy: metformin and Trulicity DKA: never Severe hypoglycemia: never.   Pancreatitis: never Pancreatic imaging: never SDOH:he is not a candidate for multiple daily injections, due to h/o noncompliance.   Interval Hx: pt states cbg's vary from 102-117.  pt states he feels well in general.  Pt says he never misses meds.  He has not recently taken insulin.   Past Medical History:  Diagnosis Date  . Arthritis   . Chronic knee pain   . Depression   . Diabetes mellitus ([place])   . Hyperlipidemia   . Hypertension   . Leg edema   . Obesity     Past Surgical History:  Procedure Laterality Date  . none      Social History   Socioeconomic History  . Marital status: Single    Spouse name: Not on file  . Number of children: 0  . Years of education: Not on file  . Highest education level: Not on file  Occupational History  . Not on file  Tobacco Use  . Smoking status: Current Every Day Smoker    Packs/day: 0.20  . Smokeless tobacco: Never Used  Substance and Sexual Activity  . Alcohol use: No  . Drug use: No  . Sexual activity: Not Currently  Other Topics Concern  . Not on file  Social History Narrative   Patient's POA is his sister, [name] [phone].           Social Determinants of Health   Financial Resource Strain:   . Difficulty of Paying Living Expenses: Not on file  Food Insecurity:   . Worried About Charity fundraiser in the Last Year: Not on file  . Ran Out of Food in the Last Year: Not on file  Transportation Needs:   . Lack of Transportation (Medical): Not on file  . Lack of Transportation (Non-Medical): Not on file  Physical Activity:   . Days of Exercise per Week: Not on file  . Minutes of Exercise per Session: Not on file  Stress:   . Feeling of Stress :  Not on file  Social Connections:   . Frequency of Communication with Friends and Family: Not on file  . Frequency of Social Gatherings with Friends and Family: Not on file  . Attends Religious Services: Not on file  . Active Member of Clubs or Organizations: Not on file  . Attends Archivist Meetings: Not on file  . Marital Status: Not on file  Intimate Partner Violence:   . Fear of Current or Ex-Partner: Not on file  . Emotionally Abused: Not on file  . Physically Abused: Not on file  . Sexually Abused: Not on file    Current Outpatient Medications on File Prior to Visit  Medication Sig Dispense Refill  . amLODipine (NORVASC) 5 MG tablet TAKE (1) TABLET BY MOUTH ONCE DAILY FOR BLOOD PRESSURE. 90 tablet 0  . aspirin EC 81 MG tablet Take 81 mg by mouth daily.    . clotrimazole (LOTRIMIN) 1 % cream Apply 1 application topically 2 (two) times daily. 30 g 1  . DULoxetine (CYMBALTA) 30 MG capsule Take 1 capsule (30 mg total) by mouth daily. 30 capsule 3  . folic acid (FOLVITE) 1 MG tablet Take 1 tablet (1 mg  total) by mouth daily. 90 tablet 3  . furosemide (LASIX) 40 MG tablet Take 1 tablet (40 mg total) by mouth 2 (two) times daily. For fluid 180 tablet 3  . gabapentin (NEURONTIN) 400 MG capsule Take 400 mg by mouth at bedtime.    . Glucose Blood (BLOOD GLUCOSE TEST STRIPS) STRP Use to monitor FSBS 3x daily. Dx: E11.65. (Patient taking differently: 1 each by Other route daily. E11.65.) 100 each 3  . HYDROcodone-acetaminophen (NORCO) 5-325 MG tablet Take 1 tablet by mouth every 6 (six) hours as needed for moderate pain. 20 tablet 0  . metFORMIN (GLUCOPHAGE) 1000 MG tablet TAKE 1 TABLET BY MOUTH TWICE DAILY WITH A MEAL FOR DIABETES. 180 tablet 2  . nitroGLYCERIN (NITROSTAT) 0.4 MG SL tablet DISSOLVE 1 TABLET UNDER TONGUE EVERY 5 MINUTES UP TO 15 MIN FOR CHESTPAIN. IF NO RELIEF CALL 911. 25 tablet 0  . potassium chloride (K-DUR) 10 MEQ tablet Take 1 tablet (10 mEq total) by mouth 2  (two) times daily. Take with fluid pill 180 tablet 3  . propranolol ER (INDERAL LA) 60 MG 24 hr capsule Take 1 capsule (60 mg total) by mouth daily. 90 capsule 2  . rosuvastatin (CRESTOR) 10 MG tablet TAKE ONE TABLET BY MOUTH ONCE DAILY. 90 tablet 0  . sulfamethoxazole-trimethoprim (BACTRIM DS) 800-160 MG tablet Take 1 tablet by mouth 2 (two) times daily. 14 tablet 0   No current facility-administered medications on file prior to visit.    Allergies  Allergen Reactions  . Lisinopril Cough    Family History  Problem Relation Age of Onset  . Heart disease Unknown   . Arthritis Unknown   . Diabetes Unknown   . Cancer Mother        deceased    BP 106/62   Pulse 75   Ht 5\' 8"  (1.727 m)   Wt 238 lb 9.6 oz (108.2 kg)   SpO2 99%   BMI 36.28 kg/m    Review of Systems He denies hypoglycemia and nausea.     Objective:   Physical Exam VITAL SIGNS:  See vs page GENERAL: no distress Pulses: dorsalis pedis intact bilat.   MSK: no deformity of the feet.  CV: 1+ bilat leg edema, and bilat vv's.   Skin:  No foot ulcer.  feet are slightly cool to touch (not wearing socks), and chronic cyanosis of the feet.   Neuro: sensation is intact to touch on the feet, but decreased from normal.  Ext: There is bilateral onychomycosis of the toenails.    Lab Results  Component Value Date   HGBA1C 7.6 (A) 05/29/2020    Lab Results  Component Value Date   CREATININE 0.84 03/31/2020   BUN 11 03/31/2020   NA 134 (L) 03/31/2020   K 4.4 03/31/2020   CL 99 03/31/2020   CO2 22 03/31/2020      Assessment & Plan:  Type 2 DM, with PN: uncontrolled.  He is controllable without insulin  Patient Instructions  check your blood sugar twice a day.  vary the time of day when you check, between before the 3 meals, and at bedtime.  also check if you have symptoms of your blood sugar being too high or too low.  please keep a record of the readings and bring it to your next appointment here (or you can  bring the meter itself).  You can write it on any piece of paper.  please call us sooner if your blood sugar goes below  70, or if you have a lot of readings over 200.  I have sent a prescription to your pharmacy, to increase the Trulicity. Please continue the same metformin Please come back for a follow-up appointment in 3-4 months.

## 2020-06-04 ENCOUNTER — Other Ambulatory Visit: Payer: Self-pay | Admitting: Family Medicine

## 2020-06-04 NOTE — Telephone Encounter (Signed)
Please D/C as duplicate.  

## 2020-06-04 NOTE — Telephone Encounter (Signed)
Ok to refill??  Last office visit 03/31/2020.  Last refill 05/15/2020, #20 tabs.

## 2020-06-23 ENCOUNTER — Other Ambulatory Visit: Payer: Self-pay | Admitting: Family Medicine

## 2020-06-23 NOTE — Telephone Encounter (Signed)
Requested Prescriptions   Pending Prescriptions Disp Refills  . HYDROcodone-acetaminophen (NORCO/VICODIN) 5-325 MG tablet [Pharmacy Med Name: HYDROCODONE/APAP 5/325MG ] 20 tablet 0    Sig: TAKE ONE TABLET BY MOUTH EVERY 6 HOURS AS NEEDED FOR MODERATE PAIN     Last OV 03/31/2020   Last written 06/05/2020

## 2020-07-15 ENCOUNTER — Other Ambulatory Visit: Payer: Self-pay | Admitting: Family Medicine

## 2020-07-15 NOTE — Telephone Encounter (Signed)
Ok to refill??  Last office visit 03/31/2020.  Last refill 06/23/2020.

## 2020-07-23 DIAGNOSIS — L84 Corns and callosities: Secondary | ICD-10-CM | POA: Diagnosis not present

## 2020-07-23 DIAGNOSIS — B351 Tinea unguium: Secondary | ICD-10-CM | POA: Diagnosis not present

## 2020-07-23 DIAGNOSIS — M79676 Pain in unspecified toe(s): Secondary | ICD-10-CM | POA: Diagnosis not present

## 2020-07-23 DIAGNOSIS — E1142 Type 2 diabetes mellitus with diabetic polyneuropathy: Secondary | ICD-10-CM | POA: Diagnosis not present

## 2020-07-31 ENCOUNTER — Ambulatory Visit: Payer: Medicare Other | Admitting: Family Medicine

## 2020-08-04 ENCOUNTER — Other Ambulatory Visit: Payer: Self-pay | Admitting: Family Medicine

## 2020-08-05 ENCOUNTER — Other Ambulatory Visit: Payer: Self-pay | Admitting: Family Medicine

## 2020-08-07 ENCOUNTER — Other Ambulatory Visit: Payer: Self-pay

## 2020-08-07 ENCOUNTER — Ambulatory Visit (INDEPENDENT_AMBULATORY_CARE_PROVIDER_SITE_OTHER): Payer: Medicare Other | Admitting: Family Medicine

## 2020-08-07 VITALS — BP 134/60 | HR 81 | Temp 97.8°F | Resp 18 | Ht 68.0 in | Wt 240.4 lb

## 2020-08-07 DIAGNOSIS — I10 Essential (primary) hypertension: Secondary | ICD-10-CM | POA: Diagnosis not present

## 2020-08-07 DIAGNOSIS — Z23 Encounter for immunization: Secondary | ICD-10-CM

## 2020-08-07 DIAGNOSIS — E0840 Diabetes mellitus due to underlying condition with diabetic neuropathy, unspecified: Secondary | ICD-10-CM | POA: Diagnosis not present

## 2020-08-07 DIAGNOSIS — Z794 Long term (current) use of insulin: Secondary | ICD-10-CM

## 2020-08-07 DIAGNOSIS — E0843 Diabetes mellitus due to underlying condition with diabetic autonomic (poly)neuropathy: Secondary | ICD-10-CM | POA: Diagnosis not present

## 2020-08-07 DIAGNOSIS — E782 Mixed hyperlipidemia: Secondary | ICD-10-CM

## 2020-08-07 DIAGNOSIS — M5030 Other cervical disc degeneration, unspecified cervical region: Secondary | ICD-10-CM

## 2020-08-07 DIAGNOSIS — Z720 Tobacco use: Secondary | ICD-10-CM

## 2020-08-07 DIAGNOSIS — Z6836 Body mass index (BMI) 36.0-36.9, adult: Secondary | ICD-10-CM

## 2020-08-07 MED ORDER — HYDROCODONE-ACETAMINOPHEN 5-325 MG PO TABS: ORAL_TABLET | ORAL | 0 refills | Status: DC

## 2020-08-07 NOTE — Progress Notes (Signed)
Subjective:    Patient ID: Kevin [name], male    DOB: [birth year], 64 y.o.   MRN: [id number]  Patient presents for Follow-up, Hypertension, and Diabetes  DM- uncontrolled, advised at last visit in June he  should be on Tujeo and the trulicity and metformin, seems he was quite confused.  His A1c resulted at 10.4%.  So I started him back  back on Metformin as well as Tujeo 30 units until he sees his endocrinologist .   Today he states he is now back on Truclity weekly, metformin and Tujeo 50 units per report  , He had A1C done 2 months ago which came back at 7.6%    His next appt is November 15th   He states his sugars have been normal    - referred to [facility] doctor at last visit   - he has seen podiatry recently  HTn- no changes   Hyperlipidemia- continue crestor  Chronic neck pain/joint pain-  He also has neuropathy Cymbalta and gabapentin but the norco is the only thing helps pain and allows him the sleep. He takes one in the evening            Due for flu shot      COVID vaccine UTD    Review Of Systems:  GEN- denies fatigue, fever, weight loss,weakness, recent illness HEENT- denies eye drainage, change in vision, nasal discharge, CVS- denies chest pain, palpitations RESP- denies SOB, cough, wheeze ABD- denies N/V, change in stools, abd pain GU- denies dysuria, hematuria, dribbling, incontinence MSK- denies joint pain, muscle aches, injury Neuro- denies headache, dizziness, syncope, seizure activity       Objective:    BP 134/60   Pulse 81   Temp 97.8 F (36.6 C)   Resp 18   Ht 5\' 8"  (1.727 m)   Wt 240 lb 6.4 oz (109 kg)   SpO2 97%   BMI 36.55 kg/m  GEN- NAD, alert and oriented x3 HEENT- PERRL, EOMI, non injected sclera, pink conjunctiva, MMM, oropharynx clear Neck- Supple, no thyromegaly, fair ROM CVS- RRR, no murmur RESP-CTAB ABD-NABS,soft,NT,ND EXT- No edema Pulses- Radial, DP- 2+         Assessment & Plan:      Problem List Items  Addressed This Visit      Unprioritized   DDD (degenerative disc disease), cervical    norco refilled, he can take 1 daily at bedtime      Relevant Medications   HYDROcodone-acetaminophen (NORCO/VICODIN) 5-325 MG tablet   Diabetes mellitus with neurological manifestation (HCC)    Continue with endocrinology F/U as previous       Relevant Medications   insulin glargine, 1 Unit Dial, (TOUJEO SOLOSTAR) 300 UNIT/ML Solostar Pen   Other Relevant Orders   CBC with Differential/Platelet (Completed)   Comprehensive metabolic panel (Completed)   Lipid panel (Completed)   Microalbumin / creatinine urine ratio   Essential hypertension, benign - Primary    Controlled no changes       Relevant Orders   CBC with Differential/Platelet (Completed)   Comprehensive metabolic panel (Completed)   Hyperlipidemia    On crestor       Neuropathy, diabetic (HCC)   Relevant Medications   insulin glargine, 1 Unit Dial, (TOUJEO SOLOSTAR) 300 UNIT/ML Solostar Pen   Obesity   Relevant Medications   insulin glargine, 1 Unit Dial, (TOUJEO SOLOSTAR) 300 UNIT/ML Solostar Pen   Tobacco use    Unchanged He is not ready to quit  Other Visit Diagnoses    Need for immunization against influenza       Relevant Orders   Flu Vaccine QUAD 36+ mos IM (Completed)      Note: This dictation was prepared with Dragon dictation along with smaller phrase technology. Any transcriptional errors that result from this process are unintentional.

## 2020-08-07 NOTE — Patient Instructions (Addendum)
Call [facility] in [place] We will call with results Flu shot given Pain medication refilled F/U 4 months

## 2020-08-08 ENCOUNTER — Encounter: Payer: Self-pay | Admitting: Family Medicine

## 2020-08-08 LAB — COMPREHENSIVE METABOLIC PANEL
AG Ratio: 1.4 (calc) (ref 1.0–2.5)
ALT: 23 U/L (ref 9–46)
AST: 24 U/L (ref 10–35)
Albumin: 4.1 g/dL (ref 3.6–5.1)
Alkaline phosphatase (APISO): 68 U/L (ref 35–144)
BUN: 13 mg/dL (ref 7–25)
CO2: 22 mmol/L (ref 20–32)
Calcium: 9.2 mg/dL (ref 8.6–10.3)
Chloride: 103 mmol/L (ref 98–110)
Creat: 0.74 mg/dL (ref 0.70–1.25)
Globulin: 2.9 g/dL (calc) (ref 1.9–3.7)
Glucose, Bld: 168 mg/dL — ABNORMAL HIGH (ref 65–99)
Potassium: 4.7 mmol/L (ref 3.5–5.3)
Sodium: 138 mmol/L (ref 135–146)
Total Bilirubin: 0.5 mg/dL (ref 0.2–1.2)
Total Protein: 7 g/dL (ref 6.1–8.1)

## 2020-08-08 LAB — CBC WITH DIFFERENTIAL/PLATELET
Absolute Monocytes: 698 cells/uL (ref 200–950)
Basophils Absolute: 116 cells/uL (ref 0–200)
Basophils Relative: 1.2 %
Eosinophils Absolute: 446 cells/uL (ref 15–500)
Eosinophils Relative: 4.6 %
HCT: 45.4 % (ref 38.5–50.0)
Hemoglobin: 16.2 g/dL (ref 13.2–17.1)
Lymphs Abs: 2910 cells/uL (ref 850–3900)
MCH: 33.8 pg — ABNORMAL HIGH (ref 27.0–33.0)
MCHC: 35.7 g/dL (ref 32.0–36.0)
MCV: 94.8 fL (ref 80.0–100.0)
MPV: 10.6 fL (ref 7.5–12.5)
Monocytes Relative: 7.2 %
Neutro Abs: 5529 cells/uL (ref 1500–7800)
Neutrophils Relative %: 57 %
Platelets: 164 10*3/uL (ref 140–400)
RBC: 4.79 10*6/uL (ref 4.20–5.80)
RDW: 11.1 % (ref 11.0–15.0)
Total Lymphocyte: 30 %
WBC: 9.7 10*3/uL (ref 3.8–10.8)

## 2020-08-08 LAB — LIPID PANEL
Cholesterol: 122 mg/dL (ref <200)
HDL: 45 mg/dL (ref >=40)
LDL Cholesterol (Calc): 59 mg/dL (calc)
Non-HDL Cholesterol (Calc): 77 mg/dL (calc) (ref <130)
Total CHOL/HDL Ratio: 2.7 (calc) (ref <5.0)
Triglycerides: 97 mg/dL (ref <150)

## 2020-08-08 NOTE — Assessment & Plan Note (Signed)
norco refilled, he can take 1 daily at bedtime

## 2020-08-08 NOTE — Assessment & Plan Note (Signed)
Unchanged He is not ready to quit

## 2020-08-08 NOTE — Assessment & Plan Note (Signed)
On crestor.   

## 2020-08-08 NOTE — Assessment & Plan Note (Signed)
Controlled no changes 

## 2020-08-08 NOTE — Assessment & Plan Note (Signed)
Continue with endocrinology F/U as previous

## 2020-08-10 ENCOUNTER — Encounter: Payer: Self-pay | Admitting: *Deleted

## 2020-08-31 ENCOUNTER — Ambulatory Visit (INDEPENDENT_AMBULATORY_CARE_PROVIDER_SITE_OTHER): Payer: Medicare Other | Admitting: Endocrinology

## 2020-08-31 ENCOUNTER — Other Ambulatory Visit: Payer: Self-pay

## 2020-08-31 ENCOUNTER — Encounter: Payer: Self-pay | Admitting: Endocrinology

## 2020-08-31 VITALS — BP 130/64 | HR 72 | Ht 68.0 in | Wt 244.0 lb

## 2020-08-31 DIAGNOSIS — E0840 Diabetes mellitus due to underlying condition with diabetic neuropathy, unspecified: Secondary | ICD-10-CM

## 2020-08-31 DIAGNOSIS — Z794 Long term (current) use of insulin: Secondary | ICD-10-CM

## 2020-08-31 LAB — POCT GLYCOSYLATED HEMOGLOBIN (HGB A1C): Hemoglobin A1C: 6.9 % — AB (ref 4.0–5.6)

## 2020-08-31 MED ORDER — TRULICITY 3 MG/0.5ML ~~LOC~~ SOAJ
3.0000 mg | SUBCUTANEOUS | 3 refills | Status: DC
Start: 2020-08-31 — End: 2020-12-29

## 2020-08-31 NOTE — Progress Notes (Signed)
Subjective:    Patient ID: Kevin [name], male    DOB: 1956-04-20, 64 y.o.   MRN: [id number]  HPI Pt returns for f/u of diabetes mellitus:  DM type: 2 Dx'ed: 4098 Complications: PN Therapy: metformin and Trulicity. DKA: never Severe hypoglycemia: never.   Pancreatitis: never Pancreatic imaging: never SDOH: he took insulin 2000-2021 Interval Hx: pt states cbg's vary from 97-107.  pt states he feels well in general.  Pt says he never misses meds.   Past Medical History:  Diagnosis Date  . Arthritis   . Chronic knee pain   . Depression   . Diabetes mellitus ([place])   . Hyperlipidemia   . Hypertension   . Leg edema   . Obesity     Past Surgical History:  Procedure Laterality Date  . none      Social History   Socioeconomic History  . Marital status: Single    Spouse name: Not on file  . Number of children: 0  . Years of education: Not on file  . Highest education level: Not on file  Occupational History  . Not on file  Tobacco Use  . Smoking status: Current Every Day Smoker    Packs/day: 0.20  . Smokeless tobacco: Never Used  Substance and Sexual Activity  . Alcohol use: No  . Drug use: No  . Sexual activity: Not Currently  Other Topics Concern  . Not on file  Social History Narrative   Patient's POA is his sister, [name] [phone].           Social Determinants of Health   Financial Resource Strain:   . Difficulty of Paying Living Expenses: Not on file  Food Insecurity:   . Worried About Charity fundraiser in the Last Year: Not on file  . Ran Out of Food in the Last Year: Not on file  Transportation Needs:   . Lack of Transportation (Medical): Not on file  . Lack of Transportation (Non-Medical): Not on file  Physical Activity:   . Days of Exercise per Week: Not on file  . Minutes of Exercise per Session: Not on file  Stress:   . Feeling of Stress : Not on file  Social Connections:   . Frequency of Communication with Friends and Family:  Not on file  . Frequency of Social Gatherings with Friends and Family: Not on file  . Attends Religious Services: Not on file  . Active Member of Clubs or Organizations: Not on file  . Attends Archivist Meetings: Not on file  . Marital Status: Not on file  Intimate Partner Violence:   . Fear of Current or Ex-Partner: Not on file  . Emotionally Abused: Not on file  . Physically Abused: Not on file  . Sexually Abused: Not on file    Current Outpatient Medications on File Prior to Visit  Medication Sig Dispense Refill  . amLODipine (NORVASC) 5 MG tablet TAKE (1) TABLET BY MOUTH ONCE DAILY FOR BLOOD PRESSURE. 90 tablet 0  . aspirin EC 81 MG tablet Take 81 mg by mouth daily.    . clotrimazole (LOTRIMIN) 1 % cream Apply 1 application topically 2 (two) times daily. 30 g 1  . DULoxetine (CYMBALTA) 30 MG capsule Take 1 capsule (30 mg total) by mouth daily. 30 capsule 3  . folic acid (FOLVITE) 1 MG tablet Take 1 tablet (1 mg total) by mouth daily. 90 tablet 3  . furosemide (LASIX) 40 MG tablet Take 1  tablet (40 mg total) by mouth 2 (two) times daily. For fluid 180 tablet 3  . gabapentin (NEURONTIN) 400 MG capsule Take 400 mg by mouth at bedtime.    . Glucose Blood (BLOOD GLUCOSE TEST STRIPS) STRP Use to monitor FSBS 3x daily. Dx: E11.65. (Patient taking differently: 1 each by Other route daily. E11.65.) 100 each 3  . HYDROcodone-acetaminophen (NORCO/VICODIN) 5-325 MG tablet TAKE ONE TABLET BY MOUTH EVERY 6 HOURS AS NEEDED FOR MODERATE PAIN 40 tablet 0  . insulin glargine, 1 Unit Dial, (TOUJEO SOLOSTAR) 300 UNIT/ML Solostar Pen Inject 50units at bedtime as prescribed    . metFORMIN (GLUCOPHAGE) 1000 MG tablet TAKE 1 TABLET BY MOUTH TWICE DAILY WITH A MEAL FOR DIABETES. 180 tablet 2  . nitroGLYCERIN (NITROSTAT) 0.4 MG SL tablet DISSOLVE 1 TABLET UNDER TONGUE EVERY 5 MINUTES UP TO 15 MIN FOR CHESTPAIN. IF NO RELIEF CALL 911. 25 tablet 0  . potassium chloride (K-DUR) 10 MEQ tablet Take 1  tablet (10 mEq total) by mouth 2 (two) times daily. Take with fluid pill 180 tablet 3  . propranolol ER (INDERAL LA) 60 MG 24 hr capsule Take 1 capsule (60 mg total) by mouth daily. 90 capsule 2  . rosuvastatin (CRESTOR) 10 MG tablet TAKE ONE TABLET BY MOUTH ONCE DAILY. 90 tablet 0   No current facility-administered medications on file prior to visit.    Allergies  Allergen Reactions  . Lisinopril Cough    Family History  Problem Relation Age of Onset  . Heart disease Unknown   . Arthritis Unknown   . Diabetes Unknown   . Cancer Mother        deceased    BP 130/64   Pulse 72   Ht 5\' 8"  (1.727 m)   Wt 244 lb (110.7 kg)   SpO2 97%   BMI 37.10 kg/m    Review of Systems Denies nausea.      Objective:   Physical Exam VITAL SIGNS:  See vs page GENERAL: no distress Pulses: dorsalis pedis intact bilat.   MSK: no deformity of the feet.  CV: 1+ bilat leg edema, and bilat vv's.   Skin:  No foot ulcer.  feet are slightly cool to touch (not wearing socks), and chronic cyanosis of the feet.   Neuro: sensation is intact to touch on the feet, but decreased from normal.   Ext: There is bilateral onychomycosis of the toenails.    Lab Results  Component Value Date   HGBA1C 6.9 (A) 08/31/2020   Lab Results  Component Value Date   CREATININE 0.74 08/07/2020   BUN 13 08/07/2020   NA 138 08/07/2020   K 4.7 08/07/2020   CL 103 08/07/2020   CO2 22 08/07/2020       Assessment & Plan:  Insulin-requiring type 2 DM, with PN: uncontrolled.   Patient Instructions  check your blood sugar twice a day.  vary the time of day when you check, between before the 3 meals, and at bedtime.  also check if you have symptoms of your blood sugar being too high or too low.  please keep a record of the readings and bring it to your next appointment here (or you can bring the meter itself).  You can write it on any piece of paper.  please call us sooner if your blood sugar goes below 70, or if you  have a lot of readings over 200.  I have sent a prescription to your pharmacy, to increase the Trulicity  again. Please continue the same metformin Please come back for a follow-up appointment in 4 months.

## 2020-08-31 NOTE — Patient Instructions (Addendum)
check your blood sugar twice a day.  vary the time of day when you check, between before the 3 meals, and at bedtime.  also check if you have symptoms of your blood sugar being too high or too low.  please keep a record of the readings and bring it to your next appointment here (or you can bring the meter itself).  You can write it on any piece of paper.  please call us sooner if your blood sugar goes below 70, or if you have a lot of readings over 200.  I have sent a prescription to your pharmacy, to increase the Trulicity again. Please continue the same metformin Please come back for a follow-up appointment in 4 months.

## 2020-09-16 ENCOUNTER — Other Ambulatory Visit: Payer: Self-pay | Admitting: Family Medicine

## 2020-09-16 NOTE — Telephone Encounter (Signed)
Ok to refill Hydrocodone/APAP??  Last office visit/ refill 08/07/2020.

## 2020-10-21 ENCOUNTER — Other Ambulatory Visit: Payer: Self-pay | Admitting: Family Medicine

## 2020-10-26 ENCOUNTER — Other Ambulatory Visit: Payer: Self-pay | Admitting: Family Medicine

## 2020-10-27 ENCOUNTER — Other Ambulatory Visit: Payer: Self-pay | Admitting: Family Medicine

## 2020-12-04 ENCOUNTER — Other Ambulatory Visit: Payer: Self-pay | Admitting: Family Medicine

## 2020-12-04 NOTE — Telephone Encounter (Signed)
Ok to refill??  Last office visit 08/07/2020.  Last refill 10/27/2020.

## 2020-12-07 ENCOUNTER — Ambulatory Visit: Payer: Medicare Other | Admitting: Family Medicine

## 2020-12-17 DIAGNOSIS — E1142 Type 2 diabetes mellitus with diabetic polyneuropathy: Secondary | ICD-10-CM | POA: Diagnosis not present

## 2020-12-17 DIAGNOSIS — M79676 Pain in unspecified toe(s): Secondary | ICD-10-CM | POA: Diagnosis not present

## 2020-12-17 DIAGNOSIS — L84 Corns and callosities: Secondary | ICD-10-CM | POA: Diagnosis not present

## 2020-12-17 DIAGNOSIS — B351 Tinea unguium: Secondary | ICD-10-CM | POA: Diagnosis not present

## 2020-12-21 ENCOUNTER — Other Ambulatory Visit: Payer: Self-pay | Admitting: Family Medicine

## 2020-12-29 ENCOUNTER — Other Ambulatory Visit: Payer: Self-pay

## 2020-12-29 ENCOUNTER — Ambulatory Visit (INDEPENDENT_AMBULATORY_CARE_PROVIDER_SITE_OTHER): Payer: Medicare HMO | Admitting: Endocrinology

## 2020-12-29 VITALS — BP 118/64 | HR 78 | Ht 69.0 in | Wt 250.2 lb

## 2020-12-29 DIAGNOSIS — E1142 Type 2 diabetes mellitus with diabetic polyneuropathy: Secondary | ICD-10-CM | POA: Diagnosis not present

## 2020-12-29 DIAGNOSIS — Z794 Long term (current) use of insulin: Secondary | ICD-10-CM

## 2020-12-29 DIAGNOSIS — E0840 Diabetes mellitus due to underlying condition with diabetic neuropathy, unspecified: Secondary | ICD-10-CM

## 2020-12-29 LAB — POCT GLYCOSYLATED HEMOGLOBIN (HGB A1C): Hemoglobin A1C: 7.7 % — AB (ref 4.0–5.6)

## 2020-12-29 MED ORDER — TRULICITY 4.5 MG/0.5ML ~~LOC~~ SOAJ
4.5000 mg | SUBCUTANEOUS | 3 refills | Status: DC
Start: 2020-12-29 — End: 2021-08-04

## 2020-12-29 NOTE — Patient Instructions (Addendum)
check your blood sugar twice a day.  vary the time of day when you check, between before the 3 meals, and at bedtime.  also check if you have symptoms of your blood sugar being too high or too low.  please keep a record of the readings and bring it to your next appointment here (or you can bring the meter itself).  You can write it on any piece of paper.  please call us sooner if your blood sugar goes below 70, or if you have a lot of readings over 200.  I have sent a prescription to your pharmacy, to increase the Trulicity again.  You can use up the 3 mg shots by taking 1 shot every 5 days.   Please continue the same metformin.   Please come back for a follow-up appointment in 4 months.

## 2020-12-29 NOTE — Progress Notes (Signed)
Subjective:    Patient ID: Kevin [name], male    DOB: [birth year], 65 y.o.   MRN: [id number]  HPI Pt returns for f/u of diabetes mellitus:  DM type: 2 Dx'ed: 3329 Complications: PN Therapy: metformin and Trulicity. DKA: never Severe hypoglycemia: never.   Pancreatitis: never Pancreatic imaging: never SDOH: he took insulin 2000-2021 Interval Hx: pt states cbg's vary from 97-107.  pt states he feels well in general.  Pt says he never misses meds.   Past Medical History:  Diagnosis Date  . Arthritis   . Chronic knee pain   . Depression   . Diabetes mellitus ([place])   . Hyperlipidemia   . Hypertension   . Leg edema   . Obesity     Past Surgical History:  Procedure Laterality Date  . none      Social History   Socioeconomic History  . Marital status: Single    Spouse name: Not on file  . Number of children: 0  . Years of education: Not on file  . Highest education level: Not on file  Occupational History  . Not on file  Tobacco Use  . Smoking status: Current Every Day Smoker    Packs/day: 0.20  . Smokeless tobacco: Never Used  Substance and Sexual Activity  . Alcohol use: No  . Drug use: No  . Sexual activity: Not Currently  Other Topics Concern  . Not on file  Social History Narrative   Patient's POA is his sister, [name] [phone].           Social Determinants of Health   Financial Resource Strain: Not on file  Food Insecurity: Not on file  Transportation Needs: Not on file  Physical Activity: Not on file  Stress: Not on file  Social Connections: Not on file  Intimate Partner Violence: Not on file    Current Outpatient Medications on File Prior to Visit  Medication Sig Dispense Refill  . amLODipine (NORVASC) 5 MG tablet TAKE (1) TABLET BY MOUTH ONCE DAILY FOR BLOOD PRESSURE. 90 tablet 0  . aspirin EC 81 MG tablet Take 81 mg by mouth daily.    . clotrimazole (LOTRIMIN) 1 % cream Apply 1 application topically 2 (two) times daily. 30 g 1   . DULoxetine (CYMBALTA) 30 MG capsule Take 1 capsule (30 mg total) by mouth daily. 30 capsule 3  . folic acid (FOLVITE) 1 MG tablet Take 1 tablet (1 mg total) by mouth daily. 90 tablet 3  . furosemide (LASIX) 40 MG tablet Take 1 tablet (40 mg total) by mouth 2 (two) times daily. For fluid 180 tablet 3  . gabapentin (NEURONTIN) 400 MG capsule Take 400 mg by mouth at bedtime.    . Glucose Blood (BLOOD GLUCOSE TEST STRIPS) STRP Use to monitor FSBS 3x daily. Dx: E11.65. (Patient taking differently: 1 each by Other route daily. E11.65.) 100 each 3  . HYDROcodone-acetaminophen (NORCO/VICODIN) 5-325 MG tablet TAKE ONE TABLET BY MOUTH EVERY 6 HOURS AS NEEDED FOR MODERATE PAIN 40 tablet 0  . insulin glargine, 1 Unit Dial, (TOUJEO SOLOSTAR) 300 UNIT/ML Solostar Pen Inject 50units at bedtime as prescribed    . metFORMIN (GLUCOPHAGE) 1000 MG tablet TAKE 1 TABLET BY MOUTH TWICE DAILY WITH A MEAL FOR DIABETES. 180 tablet 2  . nitroGLYCERIN (NITROSTAT) 0.4 MG SL tablet DISSOLVE 1 TABLET UNDER TONGUE EVERY 5 MINUTES UP TO 15 MIN FOR CHESTPAIN. IF NO RELIEF CALL 911. 25 tablet 0  . potassium chloride (K-DUR) 10  MEQ tablet Take 1 tablet (10 mEq total) by mouth 2 (two) times daily. Take with fluid pill 180 tablet 3  . propranolol ER (INDERAL LA) 60 MG 24 hr capsule Take 1 capsule (60 mg total) by mouth daily. 90 capsule 2  . rosuvastatin (CRESTOR) 10 MG tablet TAKE ONE TABLET BY MOUTH ONCE DAILY. 90 tablet 0   No current facility-administered medications on file prior to visit.    Allergies  Allergen Reactions  . Lisinopril Cough    Family History  Problem Relation Age of Onset  . Heart disease Unknown   . Arthritis Unknown   . Diabetes Unknown   . Cancer Mother        deceased    BP 118/64 (BP Location: Right Arm, Patient Position: Sitting, Cuff Size: Large)   Pulse 78   Ht 5\' 9"  (1.753 m)   Wt 250 lb 3.2 oz (113.5 kg)   SpO2 97%   BMI 36.95 kg/m    Review of Systems Denies n/v     Objective:   Physical Exam VITAL SIGNS:  See vs page GENERAL: no distress Pulses: dorsalis pedis intact bilat.   MSK: no deformity of the feet.  CV: 1+ bilat leg edema, and bilat vv's.   Skin: no foot ulcer.  Normal temp on the feet; chronic cyanosis of the feet.   Neuro: sensation is intact to touch on the feet, but decreased from normal.   Ext: There is bilateral onychomycosis of the toenails.    Lab Results  Component Value Date   HGBA1C 7.7 (A) 12/29/2020   Lab Results  Component Value Date   CREATININE 0.74 08/07/2020   BUN 13 08/07/2020   NA 138 08/07/2020   K 4.7 08/07/2020   CL 103 08/07/2020   CO2 22 08/07/2020       Assessment & Plan:  Type 2 DM, with PN: uncontrolled   Patient Instructions  check your blood sugar twice a day.  vary the time of day when you check, between before the 3 meals, and at bedtime.  also check if you have symptoms of your blood sugar being too high or too low.  please keep a record of the readings and bring it to your next appointment here (or you can bring the meter itself).  You can write it on any piece of paper.  please call us sooner if your blood sugar goes below 70, or if you have a lot of readings over 200.  I have sent a prescription to your pharmacy, to increase the Trulicity again.  You can use up the 3 mg shots by taking 1 shot every 5 days.   Please continue the same metformin.   Please come back for a follow-up appointment in 4 months.

## 2021-01-12 ENCOUNTER — Ambulatory Visit (INDEPENDENT_AMBULATORY_CARE_PROVIDER_SITE_OTHER): Payer: Medicare Other | Admitting: Internal Medicine

## 2021-01-12 ENCOUNTER — Other Ambulatory Visit: Payer: Self-pay

## 2021-01-12 ENCOUNTER — Encounter: Payer: Self-pay | Admitting: Internal Medicine

## 2021-01-12 VITALS — BP 149/74 | HR 78 | Temp 97.9°F | Resp 18 | Ht 67.0 in | Wt 248.4 lb

## 2021-01-12 DIAGNOSIS — R251 Tremor, unspecified: Secondary | ICD-10-CM

## 2021-01-12 DIAGNOSIS — M170 Bilateral primary osteoarthritis of knee: Secondary | ICD-10-CM | POA: Diagnosis not present

## 2021-01-12 DIAGNOSIS — E0840 Diabetes mellitus due to underlying condition with diabetic neuropathy, unspecified: Secondary | ICD-10-CM

## 2021-01-12 DIAGNOSIS — Z7689 Persons encountering health services in other specified circumstances: Secondary | ICD-10-CM | POA: Diagnosis not present

## 2021-01-12 DIAGNOSIS — E0843 Diabetes mellitus due to underlying condition with diabetic autonomic (poly)neuropathy: Secondary | ICD-10-CM

## 2021-01-12 DIAGNOSIS — Z720 Tobacco use: Secondary | ICD-10-CM

## 2021-01-12 DIAGNOSIS — Z794 Long term (current) use of insulin: Secondary | ICD-10-CM | POA: Diagnosis not present

## 2021-01-12 DIAGNOSIS — F17210 Nicotine dependence, cigarettes, uncomplicated: Secondary | ICD-10-CM | POA: Diagnosis not present

## 2021-01-12 DIAGNOSIS — I10 Essential (primary) hypertension: Secondary | ICD-10-CM

## 2021-01-12 MED ORDER — DULOXETINE HCL 60 MG PO CPEP: 60.0000 mg | ORAL_CAPSULE | Freq: Every day | ORAL | 1 refills | Status: DC

## 2021-01-12 MED ORDER — PROPRANOLOL HCL 20 MG PO TABS: 20.0000 mg | ORAL_TABLET | Freq: Three times a day (TID) | ORAL | 2 refills | Status: DC

## 2021-01-12 NOTE — Patient Instructions (Addendum)
Please start taking Propranolol for tremors. Start taking Cymbalta 60 mg instead of 30 mg.  Continue taking other medications as prescribed.  Please cut down smoking.  Please continue to follow low carb and low salt diet.

## 2021-01-13 NOTE — Assessment & Plan Note (Addendum)
Lab Results  Component Value Date   HGBA1C 7.7 (A) 12/29/2020   On Toujeo, Trulicity and Metformin, managed by Endocrinology Advised to follow diabetic diet On statin F/u CMP and lipid panel Diabetic eye exam: Advised to follow up with Ophthalmology for diabetic eye exam

## 2021-01-13 NOTE — Assessment & Plan Note (Signed)
Has had Neurology evaluation in the past, but did not tolerate medication prescribed by Neurology - unknown currently Chart review suggests h/o taking Propranolol, he is currently not taking. Started Propranolol

## 2021-01-13 NOTE — Assessment & Plan Note (Signed)
On Gabapentin and Cymbalta Persistent symptoms, increased Cymbalta to 60 mg Takes Norco PRN, advised patient to avoid taking Norco. Patient is made aware that we do not prescribe opioid medications as long-term pain control. He expressed understanding.

## 2021-01-13 NOTE — Assessment & Plan Note (Addendum)
Tylenol PRN Uses cane for walking support Will try to obtain lift chair for assistance to/from chair

## 2021-01-13 NOTE — Assessment & Plan Note (Signed)
Asked about quitting: confirms that he currently smokes cigarettes Advise to quit smoking: Educated about QUITTING to reduce the risk of cancer, cardio and cerebrovascular disease. Assess willingness: Unwilling to quit at this time, but is working on cutting back. Assist with counseling and pharmacotherapy: Counseled for 5 minutes and literature provided. Arrange for follow up: follow up in 3 months and continue to offer help. 

## 2021-01-13 NOTE — Progress Notes (Signed)
New Patient Office Visit  Subjective:  Patient ID: Kevin [name], male    DOB: 1956-04-28  Age: 65 y.o. MRN: [id number]  CC:  Chief Complaint  Patient presents with  . New Patient (Initial Visit)    New patient has been having burning and stinging in his right leg and foot dose have pinched nerve and torn ligament     HPI FIN HUPP is a 65 year old male with PMH of HTN, DM with neuropathy, DDD of cervical spine and tobacco abuse who presents for establishing care. He is a former patient of Dr Buelah Manis. Hiis brother is present during the visit.  His BP was elevated today, but he denies any headache, dizziness, chest pain, dyspnea or palpitations. He takes his medications regularly.  He takes Toujeo and Trulicity for DM. He also takes Metformin. He follows up with Endocrinology for DM. He c/o neuropathic pain over his feet, especially at night despite taking Gabapentin and Duloxetine. He takes Norco sometimes. Patient is made aware that we do not prescribe Norco as long-term treatment and he expressed understanding. He states that he has not been taking Norco lately.  He has h/o essential tremors, for which he was referred to Neurology in the past, but he did not tolerate the medication that was prescribed. Chart review suggests he has been on Propranolol in the past as well. He is willing to take Propranolol.  He uses cane for walking support. He has chronic knee OA and has been having difficulty moving in/out of chair and asks if he can get assistance lift.  Past Medical History:  Diagnosis Date  . Arthritis   . Chronic knee pain   . Depression   . Diabetes mellitus ([place])   . Hyperlipidemia   . Hypertension   . Leg edema   . Obesity     Past Surgical History:  Procedure Laterality Date  . none      Family History  Problem Relation Age of Onset  . Heart disease Unknown   . Arthritis Unknown   . Diabetes Unknown   . Cancer Mother        deceased    Social  History   Socioeconomic History  . Marital status: Single    Spouse name: Not on file  . Number of children: 0  . Years of education: Not on file  . Highest education level: Not on file  Occupational History  . Not on file  Tobacco Use  . Smoking status: Current Every Day Smoker    Packs/day: 0.20  . Smokeless tobacco: Never Used  Substance and Sexual Activity  . Alcohol use: No  . Drug use: No  . Sexual activity: Not Currently  Other Topics Concern  . Not on file  Social History Narrative   Patient's POA is his sister, [name] [phone].           Social Determinants of Health   Financial Resource Strain: Not on file  Food Insecurity: Not on file  Transportation Needs: Not on file  Physical Activity: Not on file  Stress: Not on file  Social Connections: Not on file  Intimate Partner Violence: Not on file    ROS Review of Systems  Constitutional: Negative for chills and fever.  HENT: Negative for congestion and sore throat.   Eyes: Negative for pain and discharge.  Respiratory: Negative for cough and shortness of breath.   Cardiovascular: Negative for chest pain and palpitations.  Gastrointestinal: Negative for constipation, diarrhea,  nausea and vomiting.  Endocrine: Negative for polydipsia and polyuria.  Genitourinary: Negative for dysuria and hematuria.  Musculoskeletal: Positive for arthralgias, back pain and gait problem. Negative for neck pain and neck stiffness.  Skin: Negative for rash.  Neurological: Positive for numbness. Negative for dizziness, weakness and headaches.  Psychiatric/Behavioral: Negative for agitation and behavioral problems.    Objective:   Today's Vitals: BP (!) 149/74 (BP Location: Right Arm, Patient Position: Sitting, Cuff Size: Normal)   Pulse 78   Temp 97.9 F (36.6 C) (Oral)   Resp 18   Ht _0  (1.702 m)   Wt 248 lb 6.4 oz (112.7 kg)   SpO2 97%   BMI 38.90 kg/m   Physical Exam Vitals reviewed.  Constitutional:       General: He is not in acute distress.    Appearance: He is not diaphoretic.  HENT:     Head: Normocephalic and atraumatic.     Nose: Nose normal.     Mouth/Throat:     Mouth: Mucous membranes are moist.  Eyes:     General: No scleral icterus.    Extraocular Movements: Extraocular movements intact.  Cardiovascular:     Rate and Rhythm: Normal rate and regular rhythm.     Pulses: Normal pulses.     Heart sounds: Normal heart sounds. No murmur heard.   Pulmonary:     Breath sounds: Normal breath sounds. No wheezing or rales.  Abdominal:     Palpations: Abdomen is soft.     Tenderness: There is no abdominal tenderness.  Musculoskeletal:     Cervical back: Neck supple. No tenderness.     Right lower leg: No edema.     Left lower leg: No edema.  Skin:    General: Skin is warm.     Findings: No rash.  Neurological:     General: No focal deficit present.     Mental Status: He is alert and oriented to person, place, and time.     Comments: Rapid and slurred speech, appears to be at baseline  Psychiatric:        Mood and Affect: Mood normal.        Behavior: Behavior normal.     Assessment & Plan:   Problem List Items Addressed This Visit      Encounter to establish care    -  Primary  Care established History and medications reviewed with the patient  Cardiovascular and Mediastinum   Essential hypertension, benign    BP Readings from Last 1 Encounters:  01/12/21 (!) 149/74   uncontrolled with Amlodipine Since starting Propranolol for tremors, would avoid changing dose of Amlodipine for now Counseled for compliance with the medications Advised DASH diet and moderate exercise/walking, at least 150 mins/week       Relevant Medications   propranolol (INDERAL) 20 MG tablet   Other Relevant Orders   CBC   CMP14+EGFR   TSH     Endocrine   Diabetes mellitus with neurological manifestation ([place])    Lab Results  Component Value Date   HGBA1C 7.7 (A) 12/29/2020   On  Toujeo and Trulicity, managed by Endocrinology Advised to follow diabetic diet On statin F/u CMP and lipid panel Diabetic eye exam: Advised to follow up with Ophthalmology for diabetic eye exam       Relevant Orders   CMP14+EGFR   Lipid panel   Hemoglobin A1c   Neuropathy, diabetic ([place])    On Gabapentin and Cymbalta Persistent symptoms, increased  Cymbalta to 60 mg Takes Norco PRN, advised patient to avoid taking Norco. Patient is made aware that we do not prescribe opioid medications as long-term pain control. He expressed understanding.      Relevant Medications   DULoxetine (CYMBALTA) 60 MG capsule     Other   Tremor    Has had Neurology evaluation in the past, but did not tolerate medication prescribed by Neurology - unknown currently Chart review suggests h/o taking Propranolol, he is currently not taking. Started Propranolol      Relevant Medications   propranolol (INDERAL) 20 MG tablet   Other Relevant Orders   TSH   Tobacco use    Asked about quitting: confirms that he currently smokes cigarettes Advise to quit smoking: Educated about QUITTING to reduce the risk of cancer, cardio and cerebrovascular disease. Assess willingness: Unwilling to quit at this time, but is working on cutting back. Assist with counseling and pharmacotherapy: Counseled for 5 minutes and literature provided. Arrange for follow up: follow up in 3 months and continue to offer help.      OA of knee Tylenol PRN Uses cane for walking support Will try to obtain lift chair for assistance to/from chair  Outpatient Encounter Medications as of 01/12/2021  Medication Sig  . amLODipine (NORVASC) 5 MG tablet TAKE (1) TABLET BY MOUTH ONCE DAILY FOR BLOOD PRESSURE.  [name] aspirin EC 81 MG tablet Take 81 mg by mouth daily.  . clotrimazole (LOTRIMIN) 1 % cream Apply 1 application topically 2 (two) times daily.  . Dulaglutide (TRULICITY) 4.5 PH/1.5AV SOPN Inject 4.5 mg as directed once a week.  . folic acid  (FOLVITE) 1 MG tablet Take 1 tablet (1 mg total) by mouth daily.  . furosemide (LASIX) 40 MG tablet Take 1 tablet (40 mg total) by mouth 2 (two) times daily. For fluid  . gabapentin (NEURONTIN) 400 MG capsule Take 400 mg by mouth at bedtime.  . Glucose Blood (BLOOD GLUCOSE TEST STRIPS) STRP Use to monitor FSBS 3x daily. Dx: E11.65. (Patient taking differently: 1 each by Other route daily. E11.65.)  . HYDROcodone-acetaminophen (NORCO/VICODIN) 5-325 MG tablet TAKE ONE TABLET BY MOUTH EVERY 6 HOURS AS NEEDED FOR MODERATE PAIN  . insulin glargine, 1 Unit Dial, (TOUJEO SOLOSTAR) 300 UNIT/ML Solostar Pen Inject 50units at bedtime as prescribed  . metFORMIN (GLUCOPHAGE) 1000 MG tablet TAKE 1 TABLET BY MOUTH TWICE DAILY WITH A MEAL FOR DIABETES.  [name] nitroGLYCERIN (NITROSTAT) 0.4 MG SL tablet DISSOLVE 1 TABLET UNDER TONGUE EVERY 5 MINUTES UP TO 15 MIN FOR CHESTPAIN. IF NO RELIEF CALL 911.  . potassium chloride (K-DUR) 10 MEQ tablet Take 1 tablet (10 mEq total) by mouth 2 (two) times daily. Take with fluid pill  . propranolol (INDERAL) 20 MG tablet Take 1 tablet (20 mg total) by mouth 3 (three) times daily.  . rosuvastatin (CRESTOR) 10 MG tablet TAKE ONE TABLET BY MOUTH ONCE DAILY.  . [DISCONTINUED] DULoxetine (CYMBALTA) 30 MG capsule Take 1 capsule (30 mg total) by mouth daily.  . [DISCONTINUED] propranolol ER (INDERAL LA) 60 MG 24 hr capsule Take 1 capsule (60 mg total) by mouth daily.  . DULoxetine (CYMBALTA) 60 MG capsule Take 1 capsule (60 mg total) by mouth daily.   No facility-administered encounter medications on file as of 01/12/2021.    Follow-up: Return in about 3 months (around 04/13/2021) for HTN, DM and neuropathy.   Lindell Spar, MD

## 2021-01-13 NOTE — Assessment & Plan Note (Signed)
BP Readings from Last 1 Encounters:  01/12/21 (!) 149/74   uncontrolled with Amlodipine Since starting Propranolol for tremors, would avoid changing dose of Amlodipine for now Counseled for compliance with the medications Advised DASH diet and moderate exercise/walking, at least 150 mins/week

## 2021-03-04 DIAGNOSIS — B351 Tinea unguium: Secondary | ICD-10-CM | POA: Diagnosis not present

## 2021-03-04 DIAGNOSIS — M79676 Pain in unspecified toe(s): Secondary | ICD-10-CM | POA: Diagnosis not present

## 2021-03-04 DIAGNOSIS — E1142 Type 2 diabetes mellitus with diabetic polyneuropathy: Secondary | ICD-10-CM | POA: Diagnosis not present

## 2021-03-04 DIAGNOSIS — L84 Corns and callosities: Secondary | ICD-10-CM | POA: Diagnosis not present

## 2021-03-05 ENCOUNTER — Other Ambulatory Visit: Payer: Self-pay | Admitting: Internal Medicine

## 2021-03-09 ENCOUNTER — Telehealth: Payer: Self-pay

## 2021-03-09 ENCOUNTER — Other Ambulatory Visit: Payer: Self-pay | Admitting: *Deleted

## 2021-03-09 MED ORDER — AMLODIPINE BESYLATE 5 MG PO TABS: ORAL_TABLET | ORAL | 0 refills | Status: DC

## 2021-03-09 NOTE — Telephone Encounter (Signed)
amLODipine (NORVASC) 5 MG tablet  Pharmacy: [facility]

## 2021-03-09 NOTE — Telephone Encounter (Signed)
Pt medication sent to pharmacy  

## 2021-03-29 ENCOUNTER — Other Ambulatory Visit: Payer: Self-pay | Admitting: Internal Medicine

## 2021-03-29 ENCOUNTER — Telehealth: Payer: Self-pay

## 2021-03-29 ENCOUNTER — Other Ambulatory Visit: Payer: Self-pay | Admitting: *Deleted

## 2021-03-29 ENCOUNTER — Other Ambulatory Visit: Payer: Self-pay | Admitting: Family Medicine

## 2021-03-29 DIAGNOSIS — E0843 Diabetes mellitus due to underlying condition with diabetic autonomic (poly)neuropathy: Secondary | ICD-10-CM

## 2021-03-29 MED ORDER — HYDROCODONE-ACETAMINOPHEN 5-325 MG PO TABS: 1.0000 | ORAL_TABLET | Freq: Four times a day (QID) | ORAL | 0 refills | Status: DC | PRN

## 2021-03-29 MED ORDER — ROSUVASTATIN CALCIUM 10 MG PO TABS: 10.0000 mg | ORAL_TABLET | Freq: Every day | ORAL | 0 refills | Status: DC

## 2021-03-29 NOTE — Telephone Encounter (Signed)
Patient called need med refills  rosuvastatin (CRESTOR) 10 MG tablet   HYDROcodone-acetaminophen (NORCO/VICODIN) 5-325 MG tablet   Pharmacy: [facility]

## 2021-03-31 ENCOUNTER — Other Ambulatory Visit: Payer: Self-pay | Admitting: Internal Medicine

## 2021-04-13 ENCOUNTER — Ambulatory Visit: Payer: Medicare Other | Admitting: Internal Medicine

## 2021-04-21 DIAGNOSIS — E119 Type 2 diabetes mellitus without complications: Secondary | ICD-10-CM | POA: Diagnosis not present

## 2021-04-21 DIAGNOSIS — Z0100 Encounter for examination of eyes and vision without abnormal findings: Secondary | ICD-10-CM | POA: Diagnosis not present

## 2021-05-03 ENCOUNTER — Ambulatory Visit: Payer: Medicare Other | Admitting: Internal Medicine

## 2021-05-03 LAB — HM DIABETES EYE EXAM

## 2021-05-04 ENCOUNTER — Ambulatory Visit: Payer: Medicare HMO | Admitting: Endocrinology

## 2021-05-04 DIAGNOSIS — H5213 Myopia, bilateral: Secondary | ICD-10-CM | POA: Diagnosis not present

## 2021-05-12 ENCOUNTER — Ambulatory Visit: Payer: Medicare Other | Admitting: Internal Medicine

## 2021-05-13 DIAGNOSIS — L84 Corns and callosities: Secondary | ICD-10-CM | POA: Diagnosis not present

## 2021-05-13 DIAGNOSIS — B351 Tinea unguium: Secondary | ICD-10-CM | POA: Diagnosis not present

## 2021-05-13 DIAGNOSIS — E1142 Type 2 diabetes mellitus with diabetic polyneuropathy: Secondary | ICD-10-CM | POA: Diagnosis not present

## 2021-05-13 DIAGNOSIS — M79676 Pain in unspecified toe(s): Secondary | ICD-10-CM | POA: Diagnosis not present

## 2021-05-14 ENCOUNTER — Ambulatory Visit: Payer: Medicare Other | Admitting: Internal Medicine

## 2021-05-21 ENCOUNTER — Ambulatory Visit: Payer: Medicare Other | Admitting: Internal Medicine

## 2021-05-27 ENCOUNTER — Ambulatory Visit (INDEPENDENT_AMBULATORY_CARE_PROVIDER_SITE_OTHER): Payer: Medicare Other | Admitting: Endocrinology

## 2021-05-27 ENCOUNTER — Other Ambulatory Visit: Payer: Self-pay

## 2021-05-27 VITALS — BP 96/42 | HR 89 | Ht 67.0 in | Wt 241.6 lb

## 2021-05-27 DIAGNOSIS — E1140 Type 2 diabetes mellitus with diabetic neuropathy, unspecified: Secondary | ICD-10-CM

## 2021-05-27 DIAGNOSIS — Z794 Long term (current) use of insulin: Secondary | ICD-10-CM | POA: Diagnosis not present

## 2021-05-27 DIAGNOSIS — E0840 Diabetes mellitus due to underlying condition with diabetic neuropathy, unspecified: Secondary | ICD-10-CM

## 2021-05-27 LAB — POCT GLYCOSYLATED HEMOGLOBIN (HGB A1C): Hemoglobin A1C: 7.9 % — AB (ref 4.0–5.6)

## 2021-05-27 MED ORDER — DAPAGLIFLOZIN PROPANEDIOL 5 MG PO TABS: 5.0000 mg | ORAL_TABLET | Freq: Every day | ORAL | 3 refills | Status: DC

## 2021-05-27 NOTE — Patient Instructions (Addendum)
check your blood sugar twice a day.  vary the time of day when you check, between before the 3 meals, and at bedtime.  also check if you have symptoms of your blood sugar being too high or too low.  please keep a record of the readings and bring it to your next appointment here (or you can bring the meter itself).  You can write it on any piece of paper.  please call us sooner if your blood sugar goes below 70, or if you have a lot of readings over 200.  I have sent a prescription to your pharmacy, to add "Wilder Glade."   Please continue the same metformin and Trulicity.   Please come back for a follow-up appointment in 3-4 months.

## 2021-05-27 NOTE — Progress Notes (Signed)
Subjective:    Patient ID: Kevin [name], male    DOB: June 10, 1956, 65 y.o.   MRN: [id number]  HPI Pt returns for f/u of diabetes mellitus:  DM type: 2 Dx'ed: [id number] Complications: PN Therapy: metformin and Trulicity. DKA: never Severe hypoglycemia: never.   Pancreatitis: never Pancreatic imaging: never SDOH: he took insulin 2000-2021 Interval Hx: pt states cbg's vary from 97-107.  pt states he feels well in general.  Pt says he never misses meds.   Past Medical History:  Diagnosis Date   Arthritis    Chronic knee pain    Depression    Diabetes mellitus ([place])    Hyperlipidemia    Hypertension    Leg edema    Obesity     Past Surgical History:  Procedure Laterality Date   none      Social History   Socioeconomic History   Marital status: Single    Spouse name: Not on file   Number of children: 0   Years of education: Not on file   Highest education level: Not on file  Occupational History   Not on file  Tobacco Use   Smoking status: Every Day    Packs/day: 0.20    Types: Cigarettes   Smokeless tobacco: Never  Substance and Sexual Activity   Alcohol use: No   Drug use: No   Sexual activity: Not Currently  Other Topics Concern   Not on file  Social History Narrative   Patient's POA is his sister, [name] [phone].           Social Determinants of Health   Financial Resource Strain: Not on file  Food Insecurity: Not on file  Transportation Needs: Not on file  Physical Activity: Not on file  Stress: Not on file  Social Connections: Not on file  Intimate Partner Violence: Not on file    Current Outpatient Medications on File Prior to Visit  Medication Sig Dispense Refill   amLODipine (NORVASC) 5 MG tablet TAKE (1) TABLET BY MOUTH ONCE DAILY FOR BLOOD PRESSURE. 90 tablet 0   amLODipine (NORVASC) 5 MG tablet TAKE (1) TABLET BY MOUTH ONCE DAILY FOR BLOOD PRESSURE. 90 tablet 0   aspirin EC 81 MG tablet Take 81 mg by mouth daily.      clotrimazole (LOTRIMIN) 1 % cream Apply 1 application topically 2 (two) times daily. 30 g 1   Dulaglutide (TRULICITY) 4.5 0000000 SOPN Inject 4.5 mg as directed once a week. 6 mL 3   DULoxetine (CYMBALTA) 60 MG capsule Take 1 capsule (60 mg total) by mouth daily. 90 capsule 1   folic acid (FOLVITE) 1 MG tablet Take 1 tablet (1 mg total) by mouth daily. 90 tablet 3   furosemide (LASIX) 40 MG tablet Take 1 tablet (40 mg total) by mouth 2 (two) times daily. For fluid 180 tablet 3   gabapentin (NEURONTIN) 400 MG capsule Take 400 mg by mouth at bedtime.     Glucose Blood (BLOOD GLUCOSE TEST STRIPS) STRP Use to monitor FSBS 3x daily. Dx: E11.65. (Patient taking differently: 1 each by Other route daily. E11.65.) 100 each 3   HYDROcodone-acetaminophen (NORCO/VICODIN) 5-325 MG tablet Take 1 tablet by mouth every 6 (six) hours as needed for moderate pain or severe pain. 40 tablet 0   metFORMIN (GLUCOPHAGE) 1000 MG tablet TAKE 1 TABLET BY MOUTH TWICE DAILY WITH A MEAL FOR DIABETES. 180 tablet 2   nitroGLYCERIN (NITROSTAT) 0.4 MG SL tablet DISSOLVE 1 TABLET UNDER TONGUE  EVERY 5 MINUTES UP TO 15 MIN FOR CHEST PAIN. IF NO RELIEF CALL 911. 25 tablet 0   potassium chloride (K-DUR) 10 MEQ tablet Take 1 tablet (10 mEq total) by mouth 2 (two) times daily. Take with fluid pill 180 tablet 3   propranolol (INDERAL) 20 MG tablet Take 1 tablet (20 mg total) by mouth 3 (three) times daily. 90 tablet 2   rosuvastatin (CRESTOR) 10 MG tablet Take 1 tablet (10 mg total) by mouth daily. 90 tablet 0   No current facility-administered medications on file prior to visit.    Allergies  Allergen Reactions   Lisinopril Cough    Family History  Problem Relation Age of Onset   Heart disease Unknown    Arthritis Unknown    Diabetes Unknown    Cancer Mother        deceased    BP (!) 96/42 (BP Location: Right Arm, Patient Position: Sitting, Cuff Size: Large)   Pulse 89   Ht '5\' 7"'$  (1.702 m)   Wt 241 lb 9.6 oz (109.6 kg)    SpO2 94%   BMI 37.84 kg/m    Review of Systems Denies N/V/HB    Objective:   Physical Exam Pulses: dorsalis pedis intact bilat.   MSK: no deformity of the feet.  CV: 2+ bilat leg edema, and bilat vv's.   Skin: no foot ulcer.  Normal temp on the feet; chronic cyanosis of the feet.   Neuro: sensation is intact to touch on the feet, but decreased from normal.   Ext: There is bilateral onychomycosis of the toenails.   Lab Results  Component Value Date   CREATININE 0.74 08/07/2020   BUN 13 08/07/2020   NA 138 08/07/2020   K 4.7 08/07/2020   CL 103 08/07/2020   CO2 22 08/07/2020    Lab Results  Component Value Date   HGBA1C 7.9 (A) 05/27/2021      Assessment & Plan:  Type 2 DM: uncontrolled HTN: overcontrolled.  D/c norvasc.  F/u soon with PCP  Patient Instructions  check your blood sugar twice a day.  vary the time of day when you check, between before the 3 meals, and at bedtime.  also check if you have symptoms of your blood sugar being too high or too low.  please keep a record of the readings and bring it to your next appointment here (or you can bring the meter itself).  You can write it on any piece of paper.  please call us sooner if your blood sugar goes below 70, or if you have a lot of readings over 200.  I have sent a prescription to your pharmacy, to add "Wilder Glade."   Please continue the same metformin and Trulicity.   Please come back for a follow-up appointment in 3-4 months.

## 2021-05-29 ENCOUNTER — Telehealth: Payer: Self-pay | Admitting: Endocrinology

## 2021-05-29 NOTE — Telephone Encounter (Signed)
please contact patient: Your BP was low when you were here.  Please stop taking the amlodipine, and have the BP rechecked soon with PCP

## 2021-05-31 NOTE — Telephone Encounter (Signed)
Spoke with pt regarding BP being low and to stop taking the amlodipine

## 2021-06-02 DIAGNOSIS — H524 Presbyopia: Secondary | ICD-10-CM | POA: Diagnosis not present

## 2021-06-10 ENCOUNTER — Ambulatory Visit: Payer: Medicare Other | Admitting: Internal Medicine

## 2021-06-16 ENCOUNTER — Other Ambulatory Visit: Payer: Self-pay | Admitting: Internal Medicine

## 2021-06-16 DIAGNOSIS — E0843 Diabetes mellitus due to underlying condition with diabetic autonomic (poly)neuropathy: Secondary | ICD-10-CM

## 2021-06-17 ENCOUNTER — Other Ambulatory Visit: Payer: Self-pay | Admitting: Internal Medicine

## 2021-06-17 ENCOUNTER — Telehealth: Payer: Self-pay | Admitting: Internal Medicine

## 2021-06-17 DIAGNOSIS — E0843 Diabetes mellitus due to underlying condition with diabetic autonomic (poly)neuropathy: Secondary | ICD-10-CM

## 2021-06-17 MED ORDER — HYDROCODONE-ACETAMINOPHEN 5-325 MG PO TABS: 1.0000 | ORAL_TABLET | Freq: Four times a day (QID) | ORAL | 0 refills | Status: DC | PRN

## 2021-06-17 NOTE — Telephone Encounter (Signed)
Left message for patient to call back and schedule Medicare Annual Wellness Visit (AWV) in office.   If unable to come into the office for AWV,  please offer to do virtually or by telephone.  Last AWV: 03/29/2019  Please schedule at anytime with [place].  40 minute appointment  Any questions, please contact me at [phone]

## 2021-06-22 ENCOUNTER — Other Ambulatory Visit: Payer: Self-pay

## 2021-06-22 ENCOUNTER — Ambulatory Visit (INDEPENDENT_AMBULATORY_CARE_PROVIDER_SITE_OTHER): Payer: Medicare Other

## 2021-06-22 DIAGNOSIS — Z0000 Encounter for general adult medical examination without abnormal findings: Secondary | ICD-10-CM | POA: Diagnosis not present

## 2021-06-22 NOTE — Progress Notes (Signed)
Subjective:   Kevin [name] is a 65 y.o. male who presents for Medicare Annual/Subsequent preventive examination. I connected with  Kevin [name] on 06/22/21 by a audio enabled telemedicine application and verified that I am speaking with the correct person using two identifiers.   I discussed the limitations of evaluation and management by telemedicine. The patient expressed understanding and agreed to proceed.   Location of patient: Home  Location of Provider: Office  Persons participating in virtual visit: Kevin [name] (patient) and Valli Glance  Review of Systems    Defer to PCP       Objective:    There were no vitals filed for this visit. There is no height or weight on file to calculate BMI.  Advanced Directives 06/22/2021 03/31/2020 03/29/2019 01/25/2018 01/11/2017 12/28/2016 08/31/2015  Does Patient Have a Medical Advance Directive? No No No No No No Yes  Type of Advance Directive - - - - - - Press photographer  Does patient want to make changes to medical advance directive? - - - - - - No - Patient declined  Copy of [place] in Chart? - - - - - - No - copy requested  Would patient like information on creating a medical advance directive? - - No - Patient declined No - Patient declined No - Patient declined No - Patient declined No - patient declined information    Current Medications (verified) Outpatient Encounter Medications as of 06/22/2021  Medication Sig   aspirin EC 81 MG tablet Take 81 mg by mouth daily.   clotrimazole (LOTRIMIN) 1 % cream Apply 1 application topically 2 (two) times daily.   dapagliflozin propanediol (FARXIGA) 5 MG TABS tablet Take 1 tablet (5 mg total) by mouth daily before breakfast.   Dulaglutide (TRULICITY) 4.5 0000000 SOPN Inject 4.5 mg as directed once a week.   DULoxetine (CYMBALTA) 60 MG capsule Take 1 capsule (60 mg total) by mouth daily.   folic acid (FOLVITE) 1 MG tablet Take 1 tablet (1 mg total) by  mouth daily.   furosemide (LASIX) 40 MG tablet Take 1 tablet (40 mg total) by mouth 2 (two) times daily. For fluid   gabapentin (NEURONTIN) 400 MG capsule Take 400 mg by mouth at bedtime.   Glucose Blood (BLOOD GLUCOSE TEST STRIPS) STRP Use to monitor FSBS 3x daily. Dx: E11.65. (Patient taking differently: 1 each by Other route daily. E11.65.)   HYDROcodone-acetaminophen (NORCO/VICODIN) 5-325 MG tablet Take 1 tablet by mouth every 6 (six) hours as needed for moderate pain or severe pain.   metFORMIN (GLUCOPHAGE) 1000 MG tablet TAKE 1 TABLET BY MOUTH TWICE DAILY WITH A MEAL FOR DIABETES.   nitroGLYCERIN (NITROSTAT) 0.4 MG SL tablet DISSOLVE 1 TABLET UNDER TONGUE EVERY 5 MINUTES UP TO 15 MIN FOR CHEST PAIN. IF NO RELIEF CALL 911.   potassium chloride (K-DUR) 10 MEQ tablet Take 1 tablet (10 mEq total) by mouth 2 (two) times daily. Take with fluid pill   propranolol (INDERAL) 20 MG tablet Take 1 tablet (20 mg total) by mouth 3 (three) times daily.   rosuvastatin (CRESTOR) 10 MG tablet Take 1 tablet (10 mg total) by mouth daily.   No facility-administered encounter medications on file as of 06/22/2021.    Allergies (verified) Lisinopril   History: Past Medical History:  Diagnosis Date   Arthritis    Chronic knee pain    Depression    Diabetes mellitus ([place])    Hyperlipidemia    Hypertension  Leg edema    Obesity    Past Surgical History:  Procedure Laterality Date   none     Family History  Problem Relation Age of Onset   Heart disease Unknown    Arthritis Unknown    Diabetes Unknown    Cancer Mother        deceased   Social History   Socioeconomic History   Marital status: Single    Spouse name: Not on file   Number of children: 0   Years of education: Not on file   Highest education level: Not on file  Occupational History   Not on file  Tobacco Use   Smoking status: Every Day    Packs/day: 0.20    Types: Cigarettes   Smokeless tobacco: Never  Substance and  Sexual Activity   Alcohol use: No   Drug use: No   Sexual activity: Not Currently  Other Topics Concern   Not on file  Social History Narrative   Patient's POA is his sister, Kevin [name] [phone].           Social Determinants of Health   Financial Resource Strain: Low Risk    Difficulty of Paying Living Expenses: Not hard at all  Food Insecurity: No Food Insecurity   Worried About Charity fundraiser in the Last Year: Never true   [place] in the Last Year: Never true  Transportation Needs: No Transportation Needs   Lack of Transportation (Medical): No   Lack of Transportation (Non-Medical): No  Physical Activity: Inactive   Days of Exercise per Week: 0 days   Minutes of Exercise per Session: 0 min  Stress: No Stress Concern Present   Feeling of Stress : Not at all  Social Connections: Moderately Isolated   Frequency of Communication with Friends and Family: Twice a week   Frequency of Social Gatherings with Friends and Family: Twice a week   Attends Religious Services: More than 4 times per year   Active Member of [employer] or Organizations: No   Attends Music therapist: Never   Marital Status: Never married    Tobacco Counseling Ready to quit: Not Answered Counseling given: Not Answered   Clinical Intake:  Pre-visit preparation completed: Yes  Pain : No/denies pain        How often do you need to have someone help you when you read instructions, pamphlets, or other written materials from your doctor or pharmacy?: 1 - Never What is the last grade level you completed in school?: 9TH GRADE  Diabetic?yes Nutrition Risk Assessment:  Has the patient had any N/V/D within the last 2 months?  No  Does the patient have any non-healing wounds?  No  Has the patient had any unintentional weight loss or weight gain?  No   Diabetes:  Is the patient diabetic?  Yes  If diabetic, was a CBG obtained today?  Yes  at home Did the patient bring in their  glucometer from home?  No  How often do you monitor your CBG's? daily.   Financial Strains and Diabetes Management:  Are you having any financial strains with the device, your supplies or your medication? No .  Does the patient want to be seen by Chronic Care Management for management of their diabetes?  No  Would the patient like to be referred to a Nutritionist or for Diabetic Management?  No   Diabetic Exams:  Diabetic Eye Exam: Overdue for diabetic eye exam. Pt  has been advised about the importance in completing this exam. Patient advised to call and schedule an eye exam. Diabetic Foot Exam: Completed 05/27/2021    Interpreter Needed?: No  Information entered by :: Cherylanne Ardelean J,CMA   Activities of Daily Living In your present state of health, do you have any difficulty performing the following activities: 06/22/2021  Hearing? N  Vision? N  Difficulty concentrating or making decisions? N  Walking or climbing stairs? Y  Dressing or bathing? N  Doing errands, shopping? N  Preparing Food and eating ? N  Using the Toilet? N  In the past six months, have you accidently leaked urine? N  Do you have problems with loss of bowel control? N  Managing your Medications? N  Managing your Finances? N  Housekeeping or managing your Housekeeping? N  Some recent data might be hidden    Patient Care Team: Lindell Spar, MD as PCP - General (Internal Medicine) Gala Romney, Cristopher Estimable, MD (Gastroenterology) Buelah Manis Modena Nunnery, MD (Family Medicine)  Indicate any recent Medical Services you may have received from other than [facility] providers in the past year (date may be approximate).     Assessment:   This is a routine wellness examination for Kevin [name].  Hearing/Vision screen No results found.  Dietary issues and exercise activities discussed: Current Exercise Habits: The patient does not participate in regular exercise at present   Goals Addressed   None    Depression Screen PHQ 2/9 Scores  06/22/2021 01/12/2021 03/31/2020 12/02/2019 07/30/2019 03/29/2019 11/27/2018  PHQ - 2 Score 0 0 0 0 0 0 0  PHQ- 9 Score - - 0 - - - -    Fall Risk Fall Risk  06/22/2021 01/12/2021 03/31/2020 12/02/2019 07/30/2019  Falls in the past year? 0 0 1 0 0  Number falls in past yr: 0 0 1 - -  Injury with Fall? 0 0 0 - -  Risk for fall due to : No Fall Risks No Fall Risks Impaired balance/gait;Impaired mobility;History of fall(s) History of fall(s);Impaired balance/gait Orthopedic patient;Impaired balance/gait  Follow up Falls evaluation completed Falls evaluation completed Falls evaluation completed Falls evaluation completed Falls evaluation completed    FALL RISK PREVENTION PERTAINING TO THE HOME:  Any stairs in or around the home? No  If so, are there any without handrails? No  Home free of loose throw rugs in walkways, pet beds, electrical cords, etc? Yes  Adequate lighting in your home to reduce risk of falls? Yes   ASSISTIVE DEVICES UTILIZED TO PREVENT FALLS:  Life alert? No  Use of a cane, walker or w/c? Yes  Grab bars in the bathroom? No  Shower chair or bench in shower? Yes  Elevated toilet seat or a handicapped toilet? No   TIMED UP AND GO:  Was the test performed?  N/A .  Length of time to ambulate 10 feet: N/A sec.     Cognitive Function:     6CIT Screen 06/22/2021  What Year? 0 points  What month? 0 points  What time? 0 points  Count back from 20 0 points  Months in reverse 0 points  Repeat phrase 2 points  Total Score 2    Immunizations Immunization History  Administered Date(s) Administered   Influenza Split 06/26/2012   Influenza,inj,Quad PF,6+ Mos 08/19/2013, 08/05/2016, 07/17/2017, 07/27/2018, 07/30/2019, 08/07/2020   Influenza-Unspecified 08/05/2014   Pneumococcal Polysaccharide-23 11/19/2013    TDAP status: Due, Education has been provided regarding the importance of this vaccine. Advised may receive this  vaccine at local pharmacy or Health Dept. Aware to  provide a copy of the vaccination record if obtained from local pharmacy or Health Dept. Verbalized acceptance and understanding.  Flu Vaccine status: Due, Education has been provided regarding the importance of this vaccine. Advised may receive this vaccine at local pharmacy or Health Dept. Aware to provide a copy of the vaccination record if obtained from local pharmacy or Health Dept. Verbalized acceptance and understanding.  Pneumococcal vaccine status: Due, Education has been provided regarding the importance of this vaccine. Advised may receive this vaccine at local pharmacy or Health Dept. Aware to provide a copy of the vaccination record if obtained from local pharmacy or Health Dept. Verbalized acceptance and understanding.  Covid-19 vaccine status: Information provided on how to obtain vaccines.   Qualifies for Shingles Vaccine? Yes   Zostavax completed No   Shingrix Completed?: No.    Education has been provided regarding the importance of this vaccine. Patient has been advised to call insurance company to determine out of pocket expense if they have not yet received this vaccine. Advised may also receive vaccine at local pharmacy or Health Dept. Verbalized acceptance and understanding.  Screening Tests Health Maintenance  Topic Date Due   COVID-19 Vaccine (1) Never done   Zoster Vaccines- Shingrix (1 of 2) Never done   COLONOSCOPY (Pts 45-39yr Insurance coverage will need to be confirmed)  Never done   Pneumococcal Vaccine 051660Years old (2 - PCV) 11/19/2014   URINE MICROALBUMIN  05/31/2016   OPHTHALMOLOGY EXAM  08/16/2017   COLON CANCER SCREENING ANNUAL FOBT  11/07/2020   INFLUENZA VACCINE  05/10/2021   TETANUS/TDAP  01/12/2022 (Originally 09/08/1975)   HEMOGLOBIN A1C  11/27/2021   FOOT EXAM  05/27/2022   PNEUMOCOCCAL POLYSACCHARIDE VACCINE AGE 50-64 HIGH RISK  Completed   Hepatitis C Screening  Completed   HIV Screening  Completed   HPV VACCINES  Aged Out    Health  Maintenance  Health Maintenance Due  Topic Date Due   COVID-19 Vaccine (1) Never done   Zoster Vaccines- Shingrix (1 of 2) Never done   COLONOSCOPY (Pts 45-464yrInsurance coverage will need to be confirmed)  Never done   Pneumococcal Vaccine 0-73461ears old (2 - PCV) 11/19/2014   URINE MICROALBUMIN  05/31/2016   OPHTHALMOLOGY EXAM  08/16/2017   COLON CANCER SCREENING ANNUAL FOBT  11/07/2020   INFLUENZA VACCINE  05/10/2021    Colorectal Caner Screening: Patient will discuss options with PCP.   Lung Cancer Screening: (Low Dose CT Chest recommended if Age 64-80ears, 30 pack-year currently smoking OR have quit w/in 15years.) does qualify.   Lung Cancer Screening Referral: NO  Additional Screening:  Hepatitis C Screening: does qualify; Completed 09/29/2019  Vision Screening: Recommended annual ophthalmology exams for early detection of glaucoma and other disorders of the eye. Is the patient up to date with their annual eye exam?  Yes  Who is the provider or what is the name of the office in which the patient attends annual eye exams? [facility]  If pt is not established with a provider, would they like to be referred to a provider to establish care? No .   Dental Screening: Recommended annual dental exams for proper oral hygiene  Community Resource Referral / Chronic Care Management: CRR required this visit?  No   CCM required this visit?  No      Plan:     I have personally reviewed and noted the following in  the patient's chart:   Medical and social history Use of alcohol, tobacco or illicit drugs  Current medications and supplements including opioid prescriptions. Patient is not currently taking opioid prescriptions. Functional ability and status Nutritional status Physical activity Advanced directives List of other physicians Hospitalizations, surgeries, and ER visits in previous 12 months Screenings to include cognitive, depression, and falls Referrals and  appointments Patient has advised that as of today, he will be receiving Meals on wheels.   In addition, I have reviewed and discussed with patient certain preventive protocols, quality metrics, and best practice recommendations. A written personalized care plan for preventive services as well as general preventive health recommendations were provided to patient.     Edgar Frisk, [facility]   06/22/2021   Nurse Notes: Non Face to Face 30 minute visit    Mr. Kevin [name] , Thank you for taking time to come for your Medicare Wellness Visit. I appreciate your ongoing commitment to your health goals. Please review the following plan we discussed and let me know if I can assist you in the future.   These are the goals we discussed:  Goals       "I want to get my sugar down because now I have to take insulin."  (pt-stated)      HEMOGLOBIN A1C < 7.0        This is a list of the screening recommended for you and due dates:  Health Maintenance  Topic Date Due   COVID-19 Vaccine (1) Never done   Zoster (Shingles) Vaccine (1 of 2) Never done   Colon Cancer Screening  Never done   Pneumococcal Vaccination (2 - PCV) 11/19/2014   Urine Protein Check  05/31/2016   Eye exam for diabetics  08/16/2017   Stool Blood Test  11/07/2020   Flu Shot  05/10/2021   Tetanus Vaccine  01/12/2022*   Hemoglobin A1C  11/27/2021   Complete foot exam   05/27/2022   Pneumococcal vaccine  Completed   Hepatitis C Screening: USPSTF Recommendation to screen - Ages 8-79 yo.  Completed   HIV Screening  Completed   HPV Vaccine  Aged Out  *Topic was postponed. The date shown is not the original due date.

## 2021-06-22 NOTE — Patient Instructions (Signed)
Health Maintenance, Male Adopting a healthy lifestyle and getting preventive care are important in promoting health and wellness. Ask your health care provider about: The right schedule for you to have regular tests and exams. Things you can do on your own to prevent diseases and keep yourself healthy. What should I know about diet, weight, and exercise? Eat a healthy diet  Eat a diet that includes plenty of vegetables, fruits, low-fat dairy products, and lean protein. Do not eat a lot of foods that are high in solid fats, added sugars, or sodium. Maintain a healthy weight Body mass index (BMI) is a measurement that can be used to identify possible weight problems. It estimates body fat based on height and weight. Your health care provider can help determine your BMI and help you achieve or maintain a healthy weight. Get regular exercise Get regular exercise. This is one of the most important things you can do for your health. Most adults should: Exercise for at least 150 minutes each week. The exercise should increase your heart rate and make you sweat (moderate-intensity exercise). Do strengthening exercises at least twice a week. This is in addition to the moderate-intensity exercise. Spend less time sitting. Even light physical activity can be beneficial. Watch cholesterol and blood lipids Have your blood tested for lipids and cholesterol at 64 years of age, then have this test every 5 years. You may need to have your cholesterol levels checked more often if: Your lipid or cholesterol levels are high. You are older than 64 years of age. You are at high risk for heart disease. What should I know about cancer screening? Many types of cancers can be detected early and may often be prevented. Depending on your health history and family history, you may need to have cancer screening at various ages. This may include screening for: Colorectal cancer. Prostate cancer. Skin cancer. Lung  cancer. What should I know about heart disease, diabetes, and high blood pressure? Blood pressure and heart disease High blood pressure causes heart disease and increases the risk of stroke. This is more likely to develop in people who have high blood pressure readings, are of African descent, or are overweight. Talk with your health care provider about your target blood pressure readings. Have your blood pressure checked: Every 3-5 years if you are 18-39 years of age. Every year if you are 40 years old or older. If you are between the ages of 65 and 75 and are a current or former smoker, ask your health care provider if you should have a one-time screening for abdominal aortic aneurysm (AAA). Diabetes Have regular diabetes screenings. This checks your fasting blood sugar level. Have the screening done: Once every three years after age 45 if you are at a normal weight and have a low risk for diabetes. More often and at a younger age if you are overweight or have a high risk for diabetes. What should I know about preventing infection? Hepatitis B If you have a higher risk for hepatitis B, you should be screened for this virus. Talk with your health care provider to find out if you are at risk for hepatitis B infection. Hepatitis C Blood testing is recommended for: Everyone born from 1945 through 1965. Anyone with known risk factors for hepatitis C. Sexually transmitted infections (STIs) You should be screened each year for STIs, including gonorrhea and chlamydia, if: You are sexually active and are younger than 64 years of age. You are older than 64 years   of age and your health care provider tells you that you are at risk for this type of infection. Your sexual activity has changed since you were last screened, and you are at increased risk for chlamydia or gonorrhea. Ask your health care provider if you are at risk. Ask your health care provider about whether you are at high risk for HIV.  Your health care provider may recommend a prescription medicine to help prevent HIV infection. If you choose to take medicine to prevent HIV, you should first get tested for HIV. You should then be tested every 3 months for as long as you are taking the medicine. Follow these instructions at home: Lifestyle Do not use any products that contain nicotine or tobacco, such as cigarettes, e-cigarettes, and chewing tobacco. If you need help quitting, ask your health care provider. Do not use street drugs. Do not share needles. Ask your health care provider for help if you need support or information about quitting drugs. Alcohol use Do not drink alcohol if your health care provider tells you not to drink. If you drink alcohol: Limit how much you have to 0-2 drinks a day. Be aware of how much alcohol is in your drink. In the [place] one drink equals one 12 oz bottle of beer (355 mL), one 5 oz glass of wine (148 mL), or one 1 oz glass of hard liquor (44 mL). General instructions Schedule regular health, dental, and eye exams. Stay current with your vaccines. Tell your health care provider if: You often feel depressed. You have ever been abused or do not feel safe at home. Summary Adopting a healthy lifestyle and getting preventive care are important in promoting health and wellness. Follow your health care provider's instructions about healthy diet, exercising, and getting tested or screened for diseases. Follow your health care provider's instructions on monitoring your cholesterol and blood pressure. This information is not intended to replace advice given to you by your health care provider. Make sure you discuss any questions you have with your health care provider. Document Revised: 12/04/2020 Document Reviewed: 09/19/2018 Elsevier Patient Education  2022 Elsevier Inc.  

## 2021-06-24 ENCOUNTER — Other Ambulatory Visit: Payer: Self-pay

## 2021-06-24 ENCOUNTER — Ambulatory Visit (INDEPENDENT_AMBULATORY_CARE_PROVIDER_SITE_OTHER): Payer: Medicare Other | Admitting: Internal Medicine

## 2021-06-24 ENCOUNTER — Encounter: Payer: Self-pay | Admitting: Internal Medicine

## 2021-06-24 VITALS — BP 130/72 | HR 77 | Temp 97.8°F | Resp 17 | Ht 69.0 in | Wt 246.1 lb

## 2021-06-24 DIAGNOSIS — E0843 Diabetes mellitus due to underlying condition with diabetic autonomic (poly)neuropathy: Secondary | ICD-10-CM | POA: Diagnosis not present

## 2021-06-24 DIAGNOSIS — E1140 Type 2 diabetes mellitus with diabetic neuropathy, unspecified: Secondary | ICD-10-CM

## 2021-06-24 DIAGNOSIS — I10 Essential (primary) hypertension: Secondary | ICD-10-CM

## 2021-06-24 DIAGNOSIS — Z23 Encounter for immunization: Secondary | ICD-10-CM | POA: Diagnosis not present

## 2021-06-24 DIAGNOSIS — Z1211 Encounter for screening for malignant neoplasm of colon: Secondary | ICD-10-CM | POA: Diagnosis not present

## 2021-06-24 DIAGNOSIS — R5381 Other malaise: Secondary | ICD-10-CM

## 2021-06-24 DIAGNOSIS — R251 Tremor, unspecified: Secondary | ICD-10-CM

## 2021-06-24 MED ORDER — MISC. DEVICES MISC: 0 refills | Status: DC

## 2021-06-24 MED ORDER — AMLODIPINE BESYLATE 5 MG PO TABS: 5.0000 mg | ORAL_TABLET | Freq: Every day | ORAL | 1 refills | Status: DC

## 2021-06-24 NOTE — Assessment & Plan Note (Signed)
Difficulty getting up from chair/bed, likely due to peripheral neuropathy in addition to hip OA and DDD of lumbar spine Prescribed chair lift for assistance and to decrease fall risk

## 2021-06-24 NOTE — Patient Instructions (Signed)
Please continue taking Amlodipine.  Please stop taking Propranolol for now.  Please continue to follow low carb and low salt diet and ambulate as prescribed.

## 2021-06-24 NOTE — Assessment & Plan Note (Signed)
Was started on propranolol, but he was having dizziness with it. DC propranolol as per patient request. Patient does not want any medication for tremors for now.  Has denied neurology referral in the past.

## 2021-06-24 NOTE — Assessment & Plan Note (Addendum)
On Gabapentin and Cymbalta Persistent symptoms, increased Cymbalta to 60 mg Takes Norco PRN, advised patient to avoid taking Norco. Patient is made aware that we do not prescribe opioid medications as long-term pain control. He expressed understanding.  Difficulty getting up from chair/bed, likely due to peripheral neuropathy in addition to hip OA and DDD of lumbar spine

## 2021-06-24 NOTE — Assessment & Plan Note (Addendum)
Lab Results  Component Value Date   HGBA1C 7.9 (A) [id number]   On Trulicity and Metformin, managed by Endocrinology Advised to follow diabetic diet On statin Diabetic eye exam: Advised to follow up with Ophthalmology for diabetic eye exam

## 2021-06-24 NOTE — Progress Notes (Signed)
Established Patient Office Visit  Subjective:  Patient ID: Kevin [name], male    DOB: 12-15-1955  Age: 65 y.o. MRN: [id number]  CC:  Chief Complaint  Patient presents with   Follow-up    3 month follow up needs refills on amlodipine and rosuvastatin    HPI Kevin [name] is a 65 year old male with PMH of HTN, DM with neuropathy, DDD of cervical spine and tobacco abuse who presents for f/u of her chronic medical conditions.  HTN: His BP was WNL today.  Of note, his amlodipine was stopped by his endocrinologist as he was having dizziness and his blood pressure was low during the office visit.  But he states that he continue to take it as he felt better the next day.  He has stopped taking propranolol, which was prescribed for essential tremors.  And he wants to avoid taking any medicines for tremors for now.  Type II DM with neuropathy: He takes metformin and Trulicity for DM.  His last HbA1c was 7.9.  He follows up with endocrinology for it.  He takes gabapentin and duloxetine for diabetic neuropathy.  He has persistent difficulty ambulating and getting up from the chair.  He has had a fall since the last visit as well, but denies any major injury.  Denies any LOC, seizures, chest pain or palpitations before the fall.  He also has history of OA of multiple joints and DDD of cervical and lumbar spine.  He would benefit from chairlift for assistance while getting up from the sitting position and to reduce fall risk.  He received flu vaccine in the office today.  Past Medical History:  Diagnosis Date   Arthritis    Chronic knee pain    Depression    Diabetes mellitus ([place])    Hyperlipidemia    Hypertension    Leg edema    Obesity     Past Surgical History:  Procedure Laterality Date   none      Family History  Problem Relation Age of Onset   Heart disease Unknown    Arthritis Unknown    Diabetes Unknown    Cancer Mother        deceased    Social History    Socioeconomic History   Marital status: Single    Spouse name: Not on file   Number of children: 0   Years of education: Not on file   Highest education level: Not on file  Occupational History   Not on file  Tobacco Use   Smoking status: Every Day    Packs/day: 0.20    Types: Cigarettes   Smokeless tobacco: Never  Substance and Sexual Activity   Alcohol use: No   Drug use: No   Sexual activity: Not Currently  Other Topics Concern   Not on file  Social History Narrative   Patient's POA is his sister, Kevin [name] [phone].           Social Determinants of Health   Financial Resource Strain: Low Risk    Difficulty of Paying Living Expenses: Not hard at all  Food Insecurity: No Food Insecurity   Worried About Charity fundraiser in the Last Year: Never true   [place] in the Last Year: Never true  Transportation Needs: No Transportation Needs   Lack of Transportation (Medical): No   Lack of Transportation (Non-Medical): No  Physical Activity: Inactive   Days of Exercise per Week: 0 days  Minutes of Exercise per Session: 0 min  Stress: No Stress Concern Present   Feeling of Stress : Not at all  Social Connections: Moderately Isolated   Frequency of Communication with Friends and Family: Twice a week   Frequency of Social Gatherings with Friends and Family: Twice a week   Attends Religious Services: More than 4 times per year   Active Member of [employer] or Organizations: No   Attends Archivist Meetings: Never   Marital Status: Never married  Human resources officer Violence: Not At Risk   Fear of Current or Ex-Partner: No   Emotionally Abused: No   Physically Abused: No   Sexually Abused: No    Outpatient Medications Prior to Visit  Medication Sig Dispense Refill   aspirin EC 81 MG tablet Take 81 mg by mouth daily.     clotrimazole (LOTRIMIN) 1 % cream Apply 1 application topically 2 (two) times daily. 30 g 1   dapagliflozin propanediol (FARXIGA) 5 MG  TABS tablet Take 1 tablet (5 mg total) by mouth daily before breakfast. 90 tablet 3   Dulaglutide (TRULICITY) 4.5 0000000 SOPN Inject 4.5 mg as directed once a week. 6 mL 3   DULoxetine (CYMBALTA) 60 MG capsule Take 1 capsule (60 mg total) by mouth daily. 90 capsule 1   folic acid (FOLVITE) 1 MG tablet Take 1 tablet (1 mg total) by mouth daily. 90 tablet 3   gabapentin (NEURONTIN) 400 MG capsule Take 400 mg by mouth at bedtime.     Glucose Blood (BLOOD GLUCOSE TEST STRIPS) STRP Use to monitor FSBS 3x daily. Dx: E11.65. (Patient taking differently: 1 each by Other route daily. E11.65.) 100 each 3   HYDROcodone-acetaminophen (NORCO/VICODIN) 5-325 MG tablet Take 1 tablet by mouth every 6 (six) hours as needed for moderate pain or severe pain. 30 tablet 0   metFORMIN (GLUCOPHAGE) 1000 MG tablet TAKE 1 TABLET BY MOUTH TWICE DAILY WITH A MEAL FOR DIABETES. 180 tablet 2   nitroGLYCERIN (NITROSTAT) 0.4 MG SL tablet DISSOLVE 1 TABLET UNDER TONGUE EVERY 5 MINUTES UP TO 15 MIN FOR CHEST PAIN. IF NO RELIEF CALL 911. 25 tablet 0   potassium chloride (K-DUR) 10 MEQ tablet Take 1 tablet (10 mEq total) by mouth 2 (two) times daily. Take with fluid pill 180 tablet 3   rosuvastatin (CRESTOR) 10 MG tablet Take 1 tablet (10 mg total) by mouth daily. 90 tablet 0   propranolol (INDERAL) 20 MG tablet Take 1 tablet (20 mg total) by mouth 3 (three) times daily. 90 tablet 2   furosemide (LASIX) 40 MG tablet Take 1 tablet (40 mg total) by mouth 2 (two) times daily. For fluid (Patient not taking: Reported on 06/24/2021) 180 tablet 3   No facility-administered medications prior to visit.    Allergies  Allergen Reactions   Lisinopril Cough    ROS Review of Systems  Constitutional:  Negative for chills and fever.  HENT:  Negative for congestion and sore throat.   Eyes:  Negative for pain and discharge.  Respiratory:  Negative for cough and shortness of breath.   Cardiovascular:  Negative for chest pain and  palpitations.  Gastrointestinal:  Negative for constipation, diarrhea, nausea and vomiting.  Endocrine: Negative for polydipsia and polyuria.  Genitourinary:  Negative for dysuria and hematuria.  Musculoskeletal:  Positive for arthralgias, back pain and gait problem. Negative for neck pain and neck stiffness.  Skin:  Negative for rash.  Neurological:  Positive for numbness. Negative for dizziness, weakness and  headaches.  Psychiatric/Behavioral:  Negative for agitation and behavioral problems.      Objective:    Physical Exam Vitals reviewed.  Constitutional:      General: He is not in acute distress.    Appearance: He is not diaphoretic.     Comments: Has a cane for walking support  HENT:     Head: Normocephalic and atraumatic.     Nose: Nose normal.     Mouth/Throat:     Mouth: Mucous membranes are moist.  Eyes:     General: No scleral icterus.    Extraocular Movements: Extraocular movements intact.  Cardiovascular:     Rate and Rhythm: Normal rate and regular rhythm.     Pulses: Normal pulses.     Heart sounds: Normal heart sounds. No murmur heard. Pulmonary:     Breath sounds: Normal breath sounds. No wheezing or rales.  Abdominal:     Palpations: Abdomen is soft.     Tenderness: There is no abdominal tenderness.  Musculoskeletal:     Cervical back: Neck supple. No tenderness.     Right lower leg: No edema.     Left lower leg: No edema.  Skin:    General: Skin is warm.     Findings: No rash.  Neurological:     General: No focal deficit present.     Mental Status: He is alert and oriented to person, place, and time.     Motor: Weakness (b/l LE, 4/5) present.     Gait: Gait abnormal.     Comments: Rapid and slurred speech, appears to be at baseline  Psychiatric:        Mood and Affect: Mood normal.        Behavior: Behavior normal.    BP 130/72 (BP Location: Right Arm, Patient Position: Sitting, Cuff Size: Large)   Pulse 77   Temp 97.8 F (36.6 C) (Oral)    Resp 17   Ht '5\' 9"'$  (1.753 m)   Wt 246 lb 1.9 oz (111.6 kg)   SpO2 94%   BMI 36.35 kg/m  Wt Readings from Last 3 Encounters:  06/24/21 246 lb 1.9 oz (111.6 kg)  05/27/21 241 lb 9.6 oz (109.6 kg)  01/12/21 248 lb 6.4 oz (112.7 kg)     Health Maintenance Due  Topic Date Due   COVID-19 Vaccine (1) Never done   Zoster Vaccines- Shingrix (1 of 2) Never done   COLONOSCOPY (Pts 45-30yr Insurance coverage will need to be confirmed)  Never done   Pneumococcal Vaccine 081651Years old (2 - PCV) 11/19/2014   URINE MICROALBUMIN  05/31/2016   OPHTHALMOLOGY EXAM  08/16/2017   COLON CANCER SCREENING ANNUAL FOBT  11/07/2020    There are no preventive care reminders to display for this patient.  Lab Results  Component Value Date   TSH 2.137 12/08/2011   Lab Results  Component Value Date   WBC 9.7 08/07/2020   HGB 16.2 08/07/2020   HCT 45.4 08/07/2020   MCV 94.8 08/07/2020   PLT 164 08/07/2020   Lab Results  Component Value Date   NA 138 08/07/2020   K 4.7 08/07/2020   CO2 22 08/07/2020   GLUCOSE 168 (H) 08/07/2020   BUN 13 08/07/2020   CREATININE 0.74 08/07/2020   BILITOT 0.5 08/07/2020   ALKPHOS 86 02/06/2017   AST 24 08/07/2020   ALT 23 08/07/2020   PROT 7.0 08/07/2020   ALBUMIN 4.0 02/06/2017   CALCIUM 9.2 08/07/2020   ANIONGAP 12  01/25/2018   Lab Results  Component Value Date   CHOL 122 08/07/2020   Lab Results  Component Value Date   HDL 45 08/07/2020   Lab Results  Component Value Date   LDLCALC 59 08/07/2020   Lab Results  Component Value Date   TRIG 97 08/07/2020   Lab Results  Component Value Date   CHOLHDL 2.7 08/07/2020   Lab Results  Component Value Date   HGBA1C 7.9 (A) 05/27/2021      Assessment & Plan:   Problem List Items Addressed This Visit       Cardiovascular and Mediastinum   Essential hypertension, benign - Primary    BP Readings from Last 1 Encounters:  06/24/21 130/72  Well-controlled with Amlodipine Counseled for  compliance with the medications Advised DASH diet and moderate exercise/walking, at least 150 mins/week       Relevant Medications   amLODipine (NORVASC) 5 MG tablet     Endocrine   Diabetes mellitus with neurological manifestation ([facility])    Lab Results  Component Value Date   HGBA1C 7.9 (A) [id number]  On Trulicity and Metformin, managed by Endocrinology Advised to follow diabetic diet On statin Diabetic eye exam: Advised to follow up with Ophthalmology for diabetic eye exam      Relevant Medications   Misc. Devices MISC   Neuropathy, diabetic ([place])    On Gabapentin and Cymbalta Persistent symptoms, increased Cymbalta to 60 mg Takes Norco PRN, advised patient to avoid taking Norco. Patient is made aware that we do not prescribe opioid medications as long-term pain control. He expressed understanding.  Difficulty getting up from chair/bed, likely due to peripheral neuropathy in addition to hip OA and DDD of lumbar spine        Other   Tremor    Was started on propranolol, but he was having dizziness with it. DC propranolol as per patient request. Patient does not want any medication for tremors for now.  Has denied neurology referral in the past.      Physical deconditioning    Difficulty getting up from chair/bed, likely due to peripheral neuropathy in addition to hip OA and DDD of lumbar spine Prescribed chair lift for assistance and to decrease fall risk      Relevant Medications   Misc. Devices MISC   Other Visit Diagnoses     Screen for colon cancer       Relevant Orders   Cologuard   Need for immunization against influenza       Relevant Orders   Flu Vaccine QUAD 62moIM (Fluarix, Fluzone & Alfiuria Quad PF) (Completed)       Meds ordered this encounter  Medications   amLODipine (NORVASC) 5 MG tablet    Sig: Take 1 tablet (5 mg total) by mouth daily.    Dispense:  90 tablet    Refill:  1   DISCONTD: Misc. Devices MISC    Sig: Lift chair     Dispense:  1 each    Refill:  0   Misc. Devices MISC    Sig: Lift chair    Dispense:  1 each    Refill:  0    Follow-up: Return in about 4 months (around 10/24/2021) for HTN, DM and neuropathy.    RLindell Spar MD

## 2021-06-24 NOTE — Assessment & Plan Note (Signed)
BP Readings from Last 1 Encounters:  06/24/21 130/72   Well-controlled with Amlodipine Counseled for compliance with the medications Advised DASH diet and moderate exercise/walking, at least 150 mins/week

## 2021-07-01 ENCOUNTER — Other Ambulatory Visit: Payer: Self-pay | Admitting: *Deleted

## 2021-07-01 DIAGNOSIS — E1140 Type 2 diabetes mellitus with diabetic neuropathy, unspecified: Secondary | ICD-10-CM

## 2021-07-01 DIAGNOSIS — R5381 Other malaise: Secondary | ICD-10-CM

## 2021-07-01 MED ORDER — MISC. DEVICES MISC: 0 refills | Status: AC

## 2021-07-22 DIAGNOSIS — E1142 Type 2 diabetes mellitus with diabetic polyneuropathy: Secondary | ICD-10-CM | POA: Diagnosis not present

## 2021-07-22 DIAGNOSIS — L84 Corns and callosities: Secondary | ICD-10-CM | POA: Diagnosis not present

## 2021-07-22 DIAGNOSIS — B351 Tinea unguium: Secondary | ICD-10-CM | POA: Diagnosis not present

## 2021-07-22 DIAGNOSIS — M79676 Pain in unspecified toe(s): Secondary | ICD-10-CM | POA: Diagnosis not present

## 2021-07-26 ENCOUNTER — Other Ambulatory Visit: Payer: Self-pay | Admitting: Internal Medicine

## 2021-07-26 DIAGNOSIS — E0843 Diabetes mellitus due to underlying condition with diabetic autonomic (poly)neuropathy: Secondary | ICD-10-CM

## 2021-08-03 ENCOUNTER — Other Ambulatory Visit: Payer: Self-pay | Admitting: Endocrinology

## 2021-08-03 ENCOUNTER — Telehealth: Payer: Self-pay

## 2021-08-03 ENCOUNTER — Other Ambulatory Visit: Payer: Self-pay | Admitting: Internal Medicine

## 2021-08-03 DIAGNOSIS — E0843 Diabetes mellitus due to underlying condition with diabetic autonomic (poly)neuropathy: Secondary | ICD-10-CM

## 2021-08-03 MED ORDER — GABAPENTIN 400 MG PO CAPS: 400.0000 mg | ORAL_CAPSULE | Freq: Every day | ORAL | 2 refills | Status: DC

## 2021-08-03 NOTE — Telephone Encounter (Signed)
Patient called need med refill  gabapentin (NEURONTIN) 400 MG capsule  Pharmacy:  [facility]

## 2021-08-04 ENCOUNTER — Other Ambulatory Visit: Payer: Self-pay

## 2021-08-04 ENCOUNTER — Telehealth: Payer: Self-pay | Admitting: Endocrinology

## 2021-08-04 DIAGNOSIS — E0840 Diabetes mellitus due to underlying condition with diabetic neuropathy, unspecified: Secondary | ICD-10-CM

## 2021-08-04 MED ORDER — TRULICITY 4.5 MG/0.5ML ~~LOC~~ SOAJ: 4.5000 mg | SUBCUTANEOUS | 3 refills | Status: DC

## 2021-08-04 NOTE — Telephone Encounter (Signed)
Pt calling in to request a refill of Dulaglutide (TRULICITY) 4.5 AS/3.4HD SOPN to  [facility] [place] [facility] [place], [place] [address]   Pt contact [phone]

## 2021-08-19 DIAGNOSIS — E1021 Type 1 diabetes mellitus with diabetic nephropathy: Secondary | ICD-10-CM | POA: Diagnosis not present

## 2021-08-27 ENCOUNTER — Other Ambulatory Visit: Payer: Self-pay

## 2021-08-27 ENCOUNTER — Ambulatory Visit (INDEPENDENT_AMBULATORY_CARE_PROVIDER_SITE_OTHER): Payer: Medicare Other | Admitting: Endocrinology

## 2021-08-27 VITALS — BP 90/40 | HR 83 | Ht 69.0 in | Wt 249.0 lb

## 2021-08-27 DIAGNOSIS — E1140 Type 2 diabetes mellitus with diabetic neuropathy, unspecified: Secondary | ICD-10-CM

## 2021-08-27 DIAGNOSIS — E0840 Diabetes mellitus due to underlying condition with diabetic neuropathy, unspecified: Secondary | ICD-10-CM | POA: Diagnosis not present

## 2021-08-27 DIAGNOSIS — Z794 Long term (current) use of insulin: Secondary | ICD-10-CM

## 2021-08-27 LAB — POCT GLYCOSYLATED HEMOGLOBIN (HGB A1C): Hemoglobin A1C: 7.3 % — AB (ref 4.0–5.6)

## 2021-08-27 LAB — BASIC METABOLIC PANEL
BUN: 17 mg/dL (ref 6–23)
CO2: 27 mEq/L (ref 19–32)
Calcium: 9.1 mg/dL (ref 8.4–10.5)
Chloride: 101 mEq/L (ref 96–112)
Creatinine, Ser: 0.78 mg/dL (ref 0.40–1.50)
GFR: 94.6 mL/min (ref >60.00)
Glucose, Bld: 118 mg/dL — ABNORMAL HIGH (ref 70–99)
Potassium: 4.3 mEq/L (ref 3.5–5.1)
Sodium: 137 mEq/L (ref 135–145)

## 2021-08-27 LAB — T4, FREE: Free T4: 0.58 ng/dL — ABNORMAL LOW (ref 0.60–1.60)

## 2021-08-27 LAB — TSH: TSH: 0.87 u[IU]/mL (ref 0.35–5.50)

## 2021-08-27 MED ORDER — DAPAGLIFLOZIN PROPANEDIOL 10 MG PO TABS: 10.0000 mg | ORAL_TABLET | Freq: Every day | ORAL | 3 refills | Status: DC

## 2021-08-27 NOTE — Progress Notes (Signed)
Subjective:    Patient ID: Kevin [name], male    DOB: 03/17/1956, 65 y.o.   MRN: [id number]  HPI Pt returns for f/u of diabetes mellitus:  DM type: 2 Dx'ed: 4193 Complications: PN Therapy: Trulicity and 2 oral meds DKA: never Severe hypoglycemia: never.   Pancreatitis: never Pancreatic imaging: never SDOH: he took insulin 2000-2021 Interval Hx: pt states cbg's are approx 100.  pt states he feels well in general.  Pt says he never misses meds.   He still takes amlodipine.  Past Medical History:  Diagnosis Date   Arthritis    Chronic knee pain    Depression    Diabetes mellitus ([place])    Hyperlipidemia    Hypertension    Leg edema    Obesity     Past Surgical History:  Procedure Laterality Date   none      Social History   Socioeconomic History   Marital status: Single    Spouse name: Not on file   Number of children: 0   Years of education: Not on file   Highest education level: Not on file  Occupational History   Not on file  Tobacco Use   Smoking status: Every Day    Packs/day: 0.20    Types: Cigarettes   Smokeless tobacco: Never  Substance and Sexual Activity   Alcohol use: No   Drug use: No   Sexual activity: Not Currently  Other Topics Concern   Not on file  Social History Narrative   Patient's POA is his sister, [name] [phone].           Social Determinants of Health   Financial Resource Strain: Low Risk    Difficulty of Paying Living Expenses: Not hard at all  Food Insecurity: No Food Insecurity   Worried About Charity fundraiser in the Last Year: Never true   [place] in the Last Year: Never true  Transportation Needs: No Transportation Needs   Lack of Transportation (Medical): No   Lack of Transportation (Non-Medical): No  Physical Activity: Inactive   Days of Exercise per Week: 0 days   Minutes of Exercise per Session: 0 min  Stress: No Stress Concern Present   Feeling of Stress : Not at all  Social Connections:  Moderately Isolated   Frequency of Communication with Friends and Family: Twice a week   Frequency of Social Gatherings with Friends and Family: Twice a week   Attends Religious Services: More than 4 times per year   Active Member of [employer] or Organizations: No   Attends Music therapist: Never   Marital Status: Never married  Human resources officer Violence: Not At Risk   Fear of Current or Ex-Partner: No   Emotionally Abused: No   Physically Abused: No   Sexually Abused: No    Current Outpatient Medications on File Prior to Visit  Medication Sig Dispense Refill   aspirin EC 81 MG tablet Take 81 mg by mouth daily.     clotrimazole (LOTRIMIN) 1 % cream Apply 1 application topically 2 (two) times daily. 30 g 1   Dulaglutide (TRULICITY) 4.5 BD/5.3GD SOPN Inject 4.5 mg as directed once a week. 6 mL 3   DULoxetine (CYMBALTA) 60 MG capsule TAKE 1 CAPSULE BY MOUTH ONCE A DAY. 90 capsule 0   folic acid (FOLVITE) 1 MG tablet Take 1 tablet (1 mg total) by mouth daily. 90 tablet 3   furosemide (LASIX) 40 MG tablet Take  1 tablet (40 mg total) by mouth 2 (two) times daily. For fluid 180 tablet 3   gabapentin (NEURONTIN) 400 MG capsule Take 1 capsule (400 mg total) by mouth at bedtime. 30 capsule 2   Glucose Blood (BLOOD GLUCOSE TEST STRIPS) STRP Use to monitor FSBS 3x daily. Dx: E11.65. (Patient taking differently: 1 each by Other route daily. E11.65.) 100 each 3   HYDROcodone-acetaminophen (NORCO/VICODIN) 5-325 MG tablet Take 1 tablet by mouth every 6 (six) hours as needed for moderate pain or severe pain. 30 tablet 0   metFORMIN (GLUCOPHAGE) 1000 MG tablet TAKE 1 TABLET BY MOUTH TWICE DAILY WITH A MEAL FOR DIABETES. 180 tablet 2   Misc. Devices MISC Lift chair 1 each 0   nitroGLYCERIN (NITROSTAT) 0.4 MG SL tablet DISSOLVE 1 TABLET UNDER TONGUE EVERY 5 MINUTES UP TO 15 MIN FOR CHEST PAIN. IF NO RELIEF CALL 911. 25 tablet 0   potassium chloride (K-DUR) 10 MEQ tablet Take 1 tablet (10 mEq total)  by mouth 2 (two) times daily. Take with fluid pill 180 tablet 3   rosuvastatin (CRESTOR) 10 MG tablet Take 1 tablet (10 mg total) by mouth daily. 90 tablet 0   No current facility-administered medications on file prior to visit.    Allergies  Allergen Reactions   Lisinopril Cough    Family History  Problem Relation Age of Onset   Heart disease Unknown    Arthritis Unknown    Diabetes Unknown    Cancer Mother        deceased    BP (!) 90/40 (BP Location: Right Arm, Patient Position: Sitting, Cuff Size: Large)   Pulse 83   Ht 5\' 9"  (1.753 m)   Wt 249 lb (112.9 kg)   SpO2 96%   BMI 36.77 kg/m   Review of Systems     Objective:   Physical Exam Pulses: dorsalis pedis intact bilat.   MSK: no deformity of the feet.  CV: 2+ bilat leg edema, and bilat vv's.   Skin: no foot ulcer.  Normal temp on the feet; chronic cyanosis of the feet.   Neuro: sensation is intact to touch on the feet, but decreased from normal.   Ext: There is bilateral onychomycosis of the toenails.    Lab Results  Component Value Date   HGBA1C 7.3 (A) 08/27/2021      Assessment & Plan:  Type 2 DM: uncontrolled.  HTN: overcontrolled.    Patient Instructions  Please stop taking the amlodipine, and have the blood pressure rechecked within 2 weeks, with your primary provider. check your blood sugar twice a day.  vary the time of day when you check, between before the 3 meals, and at bedtime.  also check if you have symptoms of your blood sugar being too high or too low.  please keep a record of the readings and bring it to your next appointment here (or you can bring the meter itself).  You can write it on any piece of paper.  please call us sooner if your blood sugar goes below 70, or if you have a lot of readings over 200.  I have sent a prescription to your pharmacy, to increase the [place].   Please continue the same metformin and Trulicity.   Please come back for a follow-up appointment in 3-4  months.

## 2021-08-27 NOTE — Patient Instructions (Addendum)
Please stop taking the amlodipine, and have the blood pressure rechecked within 2 weeks, with your primary provider. check your blood sugar twice a day.  vary the time of day when you check, between before the 3 meals, and at bedtime.  also check if you have symptoms of your blood sugar being too high or too low.  please keep a record of the readings and bring it to your next appointment here (or you can bring the meter itself).  You can write it on any piece of paper.  please call us sooner if your blood sugar goes below 70, or if you have a lot of readings over 200.  I have sent a prescription to your pharmacy, to increase the [place].   Please continue the same metformin and Trulicity.   Please come back for a follow-up appointment in 3-4 months.

## 2021-08-30 NOTE — Progress Notes (Signed)
Attempted to contact the patient @8 :43 in regards to lab results, received no answer LVM

## 2021-10-09 DIAGNOSIS — Z20822 Contact with and (suspected) exposure to covid-19: Secondary | ICD-10-CM | POA: Diagnosis not present

## 2021-10-09 DIAGNOSIS — E1021 Type 1 diabetes mellitus with diabetic nephropathy: Secondary | ICD-10-CM | POA: Diagnosis not present

## 2021-10-21 DIAGNOSIS — M79676 Pain in unspecified toe(s): Secondary | ICD-10-CM | POA: Diagnosis not present

## 2021-10-21 DIAGNOSIS — L84 Corns and callosities: Secondary | ICD-10-CM | POA: Diagnosis not present

## 2021-10-21 DIAGNOSIS — E1142 Type 2 diabetes mellitus with diabetic polyneuropathy: Secondary | ICD-10-CM | POA: Diagnosis not present

## 2021-10-21 DIAGNOSIS — B351 Tinea unguium: Secondary | ICD-10-CM | POA: Diagnosis not present

## 2021-10-25 ENCOUNTER — Encounter (INDEPENDENT_AMBULATORY_CARE_PROVIDER_SITE_OTHER): Payer: Self-pay

## 2021-10-25 ENCOUNTER — Ambulatory Visit (INDEPENDENT_AMBULATORY_CARE_PROVIDER_SITE_OTHER): Payer: Medicare Other | Admitting: Internal Medicine

## 2021-10-25 ENCOUNTER — Encounter: Payer: Self-pay | Admitting: Internal Medicine

## 2021-10-25 ENCOUNTER — Other Ambulatory Visit: Payer: Self-pay

## 2021-10-25 VITALS — BP 136/78 | HR 82 | Resp 18 | Ht 68.0 in | Wt 245.1 lb

## 2021-10-25 DIAGNOSIS — E0843 Diabetes mellitus due to underlying condition with diabetic autonomic (poly)neuropathy: Secondary | ICD-10-CM

## 2021-10-25 DIAGNOSIS — Z794 Long term (current) use of insulin: Secondary | ICD-10-CM

## 2021-10-25 DIAGNOSIS — Z9114 Patient's other noncompliance with medication regimen: Secondary | ICD-10-CM | POA: Diagnosis not present

## 2021-10-25 DIAGNOSIS — E0840 Diabetes mellitus due to underlying condition with diabetic neuropathy, unspecified: Secondary | ICD-10-CM | POA: Diagnosis not present

## 2021-10-25 DIAGNOSIS — Z6836 Body mass index (BMI) 36.0-36.9, adult: Secondary | ICD-10-CM

## 2021-10-25 DIAGNOSIS — E782 Mixed hyperlipidemia: Secondary | ICD-10-CM

## 2021-10-25 DIAGNOSIS — Z1211 Encounter for screening for malignant neoplasm of colon: Secondary | ICD-10-CM

## 2021-10-25 DIAGNOSIS — Z23 Encounter for immunization: Secondary | ICD-10-CM

## 2021-10-25 DIAGNOSIS — E038 Other specified hypothyroidism: Secondary | ICD-10-CM

## 2021-10-25 MED ORDER — DULOXETINE HCL 60 MG PO CPEP: 60.0000 mg | ORAL_CAPSULE | Freq: Every day | ORAL | 1 refills | Status: DC

## 2021-10-25 MED ORDER — HYDROCODONE-ACETAMINOPHEN 5-325 MG PO TABS: 1.0000 | ORAL_TABLET | Freq: Four times a day (QID) | ORAL | 0 refills | Status: DC | PRN

## 2021-10-25 MED ORDER — ROSUVASTATIN CALCIUM 10 MG PO TABS: 10.0000 mg | ORAL_TABLET | Freq: Every day | ORAL | 0 refills | Status: DC

## 2021-10-25 NOTE — Progress Notes (Signed)
Established Patient Office Visit  Subjective:  Patient ID: Kevin [name], male    DOB: 11-22-1955  Age: 66 y.o. MRN: [id number]  CC:  Chief Complaint  Patient presents with   Follow-up    4 month follow up DM HTN And neuropathy pt is doing good needs refill on duloxetine hydrocodone     HPI Kevin [name] is a 66 y.o. male with past medical history of DM with neuropathy, DDD of cervical spine and tobacco abuse who presents for f/u of his chronic medical conditions.  Type II DM with neuropathy: He takes metformin, [place] and Trulicity for DM. His last HbA1c was 7.3.  He follows up with endocrinology for it.  He takes gabapentin and duloxetine for diabetic neuropathy. He still c/o neuropathic pain in feet and takes Norco PRN for severe pain and requests a refill of it.  He is not sure whether he is taking statin.  He received Cologuard, but was not able to send it as he was confused where to send it. I have discussed with him to send a new specimen kit to [employer] or call the number provided on kit. He currently denies any melena or hematochezia.  He received first dose of Shingrix vaccine in the office today.  Past Medical History:  Diagnosis Date   Arthritis    Chronic knee pain    Depression    Diabetes mellitus ([place])    Hyperlipidemia    Hypertension    Leg edema    Obesity     Past Surgical History:  Procedure Laterality Date   none      Family History  Problem Relation Age of Onset   Heart disease Unknown    Arthritis Unknown    Diabetes Unknown    Cancer Mother        deceased    Social History   Socioeconomic History   Marital status: Single    Spouse name: Not on file   Number of children: 0   Years of education: Not on file   Highest education level: Not on file  Occupational History   Not on file  Tobacco Use   Smoking status: Every Day    Packs/day: 0.20    Types: Cigarettes   Smokeless tobacco: Never  Substance and Sexual Activity    Alcohol use: No   Drug use: No   Sexual activity: Not Currently  Other Topics Concern   Not on file  Social History Narrative   Patient's POA is his sister, [name] [phone].           Social Determinants of Health   Financial Resource Strain: Low Risk    Difficulty of Paying Living Expenses: Not hard at all  Food Insecurity: No Food Insecurity   Worried About Charity fundraiser in the Last Year: Never true   [place] in the Last Year: Never true  Transportation Needs: No Transportation Needs   Lack of Transportation (Medical): No   Lack of Transportation (Non-Medical): No  Physical Activity: Inactive   Days of Exercise per Week: 0 days   Minutes of Exercise per Session: 0 min  Stress: No Stress Concern Present   Feeling of Stress : Not at all  Social Connections: Moderately Isolated   Frequency of Communication with Friends and Family: Twice a week   Frequency of Social Gatherings with Friends and Family: Twice a week   Attends Religious Services: More than 4 times per year  Active Member of Clubs or Organizations: No   Attends Archivist Meetings: Never   Marital Status: Never married  Human resources officer Violence: Not At Risk   Fear of Current or Ex-Partner: No   Emotionally Abused: No   Physically Abused: No   Sexually Abused: No    Outpatient Medications Prior to Visit  Medication Sig Dispense Refill   aspirin EC 81 MG tablet Take 81 mg by mouth daily.     clotrimazole (LOTRIMIN) 1 % cream Apply 1 application topically 2 (two) times daily. 30 g 1   dapagliflozin propanediol (FARXIGA) 10 MG TABS tablet Take 1 tablet (10 mg total) by mouth daily before breakfast. 90 tablet 3   Dulaglutide (TRULICITY) 4.5 VA/7.0LI SOPN Inject 4.5 mg as directed once a week. 6 mL 3   folic acid (FOLVITE) 1 MG tablet Take 1 tablet (1 mg total) by mouth daily. 90 tablet 3   furosemide (LASIX) 40 MG tablet Take 1 tablet (40 mg total) by mouth 2 (two) times daily. For  fluid 180 tablet 3   gabapentin (NEURONTIN) 400 MG capsule Take 1 capsule (400 mg total) by mouth at bedtime. 30 capsule 2   Glucose Blood (BLOOD GLUCOSE TEST STRIPS) STRP Use to monitor FSBS 3x daily. Dx: E11.65. (Patient taking differently: 1 each by Other route daily. E11.65.) 100 each 3   metFORMIN (GLUCOPHAGE) 1000 MG tablet TAKE 1 TABLET BY MOUTH TWICE DAILY WITH A MEAL FOR DIABETES. 180 tablet 2   Misc. Devices MISC Lift chair 1 each 0   nitroGLYCERIN (NITROSTAT) 0.4 MG SL tablet DISSOLVE 1 TABLET UNDER TONGUE EVERY 5 MINUTES UP TO 15 MIN FOR CHEST PAIN. IF NO RELIEF CALL 911. 25 tablet 0   potassium chloride (K-DUR) 10 MEQ tablet Take 1 tablet (10 mEq total) by mouth 2 (two) times daily. Take with fluid pill 180 tablet 3   DULoxetine (CYMBALTA) 60 MG capsule TAKE 1 CAPSULE BY MOUTH ONCE A DAY. 90 capsule 0   HYDROcodone-acetaminophen (NORCO/VICODIN) 5-325 MG tablet Take 1 tablet by mouth every 6 (six) hours as needed for moderate pain or severe pain. 30 tablet 0   rosuvastatin (CRESTOR) 10 MG tablet Take 1 tablet (10 mg total) by mouth daily. 90 tablet 0   No facility-administered medications prior to visit.    Allergies  Allergen Reactions   Lisinopril Cough    ROS Review of Systems  Constitutional:  Negative for chills and fever.  HENT:  Negative for congestion and sore throat.   Eyes:  Negative for pain and discharge.  Respiratory:  Negative for cough and shortness of breath.   Cardiovascular:  Negative for chest pain and palpitations.  Gastrointestinal:  Negative for constipation, diarrhea, nausea and vomiting.  Endocrine: Negative for polydipsia and polyuria.  Genitourinary:  Negative for dysuria and hematuria.  Musculoskeletal:  Positive for arthralgias, back pain and gait problem. Negative for neck pain and neck stiffness.  Skin:  Negative for rash.  Neurological:  Positive for numbness. Negative for dizziness, weakness and headaches.  Psychiatric/Behavioral:  Negative  for agitation and behavioral problems.      Objective:    Physical Exam Vitals reviewed.  Constitutional:      General: He is not in acute distress.    Appearance: He is not diaphoretic.     Comments: Has a cane for walking support  HENT:     Head: Normocephalic and atraumatic.     Nose: Nose normal.     Mouth/Throat:  Mouth: Mucous membranes are moist.  Eyes:     General: No scleral icterus.    Extraocular Movements: Extraocular movements intact.  Cardiovascular:     Rate and Rhythm: Normal rate and regular rhythm.     Pulses: Normal pulses.     Heart sounds: Normal heart sounds. No murmur heard. Pulmonary:     Breath sounds: Normal breath sounds. No wheezing or rales.  Abdominal:     Palpations: Abdomen is soft.     Tenderness: There is no abdominal tenderness.  Musculoskeletal:     Cervical back: Neck supple. No tenderness.     Right lower leg: No edema.     Left lower leg: No edema.  Skin:    General: Skin is warm.     Findings: No rash.  Neurological:     General: No focal deficit present.     Mental Status: He is alert and oriented to person, place, and time.     Motor: Weakness (b/l LE, 4/5) present.     Gait: Gait abnormal.     Comments: Rapid and slurred speech, appears to be at baseline  Psychiatric:        Mood and Affect: Mood normal.        Behavior: Behavior normal.    BP 136/78 (BP Location: Right Arm, Patient Position: Sitting, Cuff Size: Normal)    Pulse 82    Resp 18    Ht _0  (1.727 m)    Wt 245 lb 1.3 oz (111.2 kg)    SpO2 98%    BMI 37.26 kg/m  Wt Readings from Last 3 Encounters:  10/25/21 245 lb 1.3 oz (111.2 kg)  08/27/21 249 lb (112.9 kg)  06/24/21 246 lb 1.9 oz (111.6 kg)    Lab Results  Component Value Date   TSH 0.87 08/27/2021   Lab Results  Component Value Date   WBC 9.7 08/07/2020   HGB 16.2 08/07/2020   HCT 45.4 08/07/2020   MCV 94.8 08/07/2020   PLT 164 08/07/2020   Lab Results  Component Value Date   NA 137  08/27/2021   K 4.3 08/27/2021   CO2 27 08/27/2021   GLUCOSE 118 (H) 08/27/2021   BUN 17 08/27/2021   CREATININE 0.78 08/27/2021   BILITOT 0.5 08/07/2020   ALKPHOS 86 02/06/2017   AST 24 08/07/2020   ALT 23 08/07/2020   PROT 7.0 08/07/2020   ALBUMIN 4.0 02/06/2017   CALCIUM 9.1 08/27/2021   ANIONGAP 12 01/25/2018   GFR 94.60 08/27/2021   Lab Results  Component Value Date   CHOL 122 08/07/2020   Lab Results  Component Value Date   HDL 45 08/07/2020   Lab Results  Component Value Date   LDLCALC 59 08/07/2020   Lab Results  Component Value Date   TRIG 97 08/07/2020   Lab Results  Component Value Date   CHOLHDL 2.7 08/07/2020   Lab Results  Component Value Date   HGBA1C 7.3 (A) 08/27/2021      Assessment & Plan:   Problem List Items Addressed This Visit       Endocrine   Diabetes mellitus with neurological manifestation ([place]) - Primary    Lab Results  Component Value Date   HGBA1C 7.3 (A) 74/25/9563  On Trulicity, Farxiga and Metformin, managed by Endocrinology Advised to follow diabetic diet On statin Diabetic eye exam: Advised to follow up with Ophthalmology for diabetic eye exam      Relevant Medications   rosuvastatin (CRESTOR)  10 MG tablet   Other Relevant Orders   Microalbumin, urine   CMP14+EGFR   Neuropathy, diabetic (HCC)    On Gabapentin and Cymbalta Persistent symptoms, increased Cymbalta to 60 mg Takes Norco PRN, advised patient to avoid taking Norco - refilled for now Patient is made aware that we do not prescribe opioid medications as long-term pain control. He expressed understanding.  Difficulty getting up from chair/bed, likely due to peripheral neuropathy in addition to hip OA and DDD of lumbar spine      Relevant Medications   DULoxetine (CYMBALTA) 60 MG capsule   HYDROcodone-acetaminophen (NORCO/VICODIN) 5-325 MG tablet   rosuvastatin (CRESTOR) 10 MG tablet   Subclinical hypothyroidism    Normal TSH with low free T4 Will  recheck TSH and free T4      Relevant Orders   TSH + free T4     Other   Hyperlipidemia   Relevant Medications   rosuvastatin (CRESTOR) 10 MG tablet   Other Relevant Orders   Lipid Profile   Class 2 severe obesity due to excess calories with serious comorbidity and body mass index (BMI) of 36.0 to 36.9 in adult [facility])    Diet modification and ambulation as tolerated On Trulicity for DM      History of medication noncompliance    Leading to uncontrolled type 2 DM in the past, does not get refills on time Referred to Home health       Relevant Orders   Ambulatory referral to [place]   Other Visit Diagnoses     Screen for colon cancer       Relevant Orders   Cologuard   Need for varicella vaccine       Relevant Orders   Varicella-zoster vaccine IM (Shingrix) (Completed)       Meds ordered this encounter  Medications   DULoxetine (CYMBALTA) 60 MG capsule    Sig: Take 1 capsule (60 mg total) by mouth daily.    Dispense:  90 capsule    Refill:  1   HYDROcodone-acetaminophen (NORCO/VICODIN) 5-325 MG tablet    Sig: Take 1 tablet by mouth every 6 (six) hours as needed for moderate pain or severe pain.    Dispense:  30 tablet    Refill:  0   rosuvastatin (CRESTOR) 10 MG tablet    Sig: Take 1 tablet (10 mg total) by mouth daily.    Dispense:  90 tablet    Refill:  0    Follow-up: Return in about 4 months (around 02/22/2022) for Annual physical.    Lindell Spar, MD

## 2021-10-25 NOTE — Assessment & Plan Note (Signed)
Lab Results  Component Value Date   HGBA1C 7.3 (A) 88/89/1694   On Trulicity, Farxiga and Metformin, managed by Endocrinology Advised to follow diabetic diet On statin Diabetic eye exam: Advised to follow up with Ophthalmology for diabetic eye exam

## 2021-10-25 NOTE — Assessment & Plan Note (Signed)
Diet modification and ambulation as tolerated On Trulicity for DM

## 2021-10-25 NOTE — Patient Instructions (Addendum)
Please continue taking medications as prescribed.  Please continue to follow low carb diet and ambulate as tolerated.  Please bring your medications in the next visit.

## 2021-10-25 NOTE — Assessment & Plan Note (Signed)
Leading to uncontrolled type 2 DM in the past, does not get refills on time Referred to Home health

## 2021-10-25 NOTE — Assessment & Plan Note (Addendum)
On Gabapentin and Cymbalta Persistent symptoms, increased Cymbalta to 60 mg Takes Norco PRN, advised patient to avoid taking Norco - refilled for now after reviewing PDMP Patient is made aware that we do not prescribe opioid medications as long-term pain control. He expressed understanding.  Difficulty getting up from chair/bed, likely due to peripheral neuropathy in addition to hip OA and DDD of lumbar spine

## 2021-10-25 NOTE — Assessment & Plan Note (Signed)
Normal TSH with low free T4 Will recheck TSH and free T4

## 2021-10-26 LAB — CMP14+EGFR
ALT: 21 IU/L (ref 0–44)
AST: 20 IU/L (ref 0–40)
Albumin/Globulin Ratio: 1.5 (ref 1.2–2.2)
Albumin: 4.6 g/dL (ref 3.8–4.8)
Alkaline Phosphatase: 93 IU/L (ref 44–121)
BUN/Creatinine Ratio: 16 (ref 10–24)
BUN: 13 mg/dL (ref 8–27)
Bilirubin Total: 0.4 mg/dL (ref 0.0–1.2)
CO2: 24 mmol/L (ref 20–29)
Calcium: 9.3 mg/dL (ref 8.6–10.2)
Chloride: 100 mmol/L (ref 96–106)
Creatinine, Ser: 0.82 mg/dL (ref 0.76–1.27)
Globulin, Total: 3 g/dL (ref 1.5–4.5)
Glucose: 85 mg/dL (ref 70–99)
Potassium: 4 mmol/L (ref 3.5–5.2)
Sodium: 139 mmol/L (ref 134–144)
Total Protein: 7.6 g/dL (ref 6.0–8.5)
eGFR: 98 mL/min/{1.73_m2} (ref >59)

## 2021-10-26 LAB — LIPID PANEL
Chol/HDL Ratio: 4.4 ratio (ref 0.0–5.0)
Cholesterol, Total: 201 mg/dL — ABNORMAL HIGH (ref 100–199)
HDL: 46 mg/dL (ref >39)
LDL Chol Calc (NIH): 134 mg/dL — ABNORMAL HIGH (ref 0–99)
Triglycerides: 119 mg/dL (ref 0–149)
VLDL Cholesterol Cal: 21 mg/dL (ref 5–40)

## 2021-10-26 LAB — TSH+FREE T4
Free T4: 0.95 ng/dL (ref 0.82–1.77)
TSH: 1.18 u[IU]/mL (ref 0.450–4.500)

## 2021-10-27 ENCOUNTER — Encounter: Payer: Self-pay | Admitting: *Deleted

## 2021-10-27 ENCOUNTER — Telehealth: Payer: Self-pay | Admitting: Internal Medicine

## 2021-10-27 LAB — MICROALBUMIN, URINE: Microalbumin, Urine: 17.5 ug/mL

## 2021-10-27 NOTE — Telephone Encounter (Signed)
Pt return call for blood work results

## 2021-10-27 NOTE — Telephone Encounter (Signed)
LVM for pt to call the office.

## 2021-10-28 ENCOUNTER — Telehealth: Payer: Self-pay | Admitting: Internal Medicine

## 2021-10-28 DIAGNOSIS — F17210 Nicotine dependence, cigarettes, uncomplicated: Secondary | ICD-10-CM | POA: Diagnosis not present

## 2021-10-28 DIAGNOSIS — E1142 Type 2 diabetes mellitus with diabetic polyneuropathy: Secondary | ICD-10-CM | POA: Diagnosis not present

## 2021-10-28 DIAGNOSIS — M5136 Other intervertebral disc degeneration, lumbar region: Secondary | ICD-10-CM | POA: Diagnosis not present

## 2021-10-28 DIAGNOSIS — M169 Osteoarthritis of hip, unspecified: Secondary | ICD-10-CM | POA: Diagnosis not present

## 2021-10-28 DIAGNOSIS — I10 Essential (primary) hypertension: Secondary | ICD-10-CM | POA: Diagnosis not present

## 2021-10-28 DIAGNOSIS — Z7985 Long-term (current) use of injectable non-insulin antidiabetic drugs: Secondary | ICD-10-CM | POA: Diagnosis not present

## 2021-10-28 DIAGNOSIS — F32A Depression, unspecified: Secondary | ICD-10-CM | POA: Diagnosis not present

## 2021-10-28 DIAGNOSIS — R600 Localized edema: Secondary | ICD-10-CM | POA: Diagnosis not present

## 2021-10-28 DIAGNOSIS — Z7984 Long term (current) use of oral hypoglycemic drugs: Secondary | ICD-10-CM | POA: Diagnosis not present

## 2021-10-28 DIAGNOSIS — E785 Hyperlipidemia, unspecified: Secondary | ICD-10-CM | POA: Diagnosis not present

## 2021-10-28 DIAGNOSIS — E039 Hypothyroidism, unspecified: Secondary | ICD-10-CM | POA: Diagnosis not present

## 2021-10-28 NOTE — Telephone Encounter (Signed)
Spoke with patient regarding labs.

## 2021-10-28 NOTE — Telephone Encounter (Signed)
Pt LVM for return call  per last tele

## 2021-11-03 DIAGNOSIS — M5136 Other intervertebral disc degeneration, lumbar region: Secondary | ICD-10-CM | POA: Diagnosis not present

## 2021-11-03 DIAGNOSIS — Z7984 Long term (current) use of oral hypoglycemic drugs: Secondary | ICD-10-CM | POA: Diagnosis not present

## 2021-11-03 DIAGNOSIS — E039 Hypothyroidism, unspecified: Secondary | ICD-10-CM | POA: Diagnosis not present

## 2021-11-03 DIAGNOSIS — E1142 Type 2 diabetes mellitus with diabetic polyneuropathy: Secondary | ICD-10-CM | POA: Diagnosis not present

## 2021-11-03 DIAGNOSIS — F17210 Nicotine dependence, cigarettes, uncomplicated: Secondary | ICD-10-CM | POA: Diagnosis not present

## 2021-11-03 DIAGNOSIS — M169 Osteoarthritis of hip, unspecified: Secondary | ICD-10-CM | POA: Diagnosis not present

## 2021-11-03 DIAGNOSIS — Z7985 Long-term (current) use of injectable non-insulin antidiabetic drugs: Secondary | ICD-10-CM | POA: Diagnosis not present

## 2021-11-03 DIAGNOSIS — E785 Hyperlipidemia, unspecified: Secondary | ICD-10-CM | POA: Diagnosis not present

## 2021-11-03 DIAGNOSIS — I10 Essential (primary) hypertension: Secondary | ICD-10-CM | POA: Diagnosis not present

## 2021-11-03 DIAGNOSIS — F32A Depression, unspecified: Secondary | ICD-10-CM | POA: Diagnosis not present

## 2021-11-03 DIAGNOSIS — R600 Localized edema: Secondary | ICD-10-CM | POA: Diagnosis not present

## 2021-11-09 DIAGNOSIS — E1021 Type 1 diabetes mellitus with diabetic nephropathy: Secondary | ICD-10-CM | POA: Diagnosis not present

## 2021-11-09 DIAGNOSIS — Z20822 Contact with and (suspected) exposure to covid-19: Secondary | ICD-10-CM | POA: Diagnosis not present

## 2021-11-12 DIAGNOSIS — Z6837 Body mass index (BMI) 37.0-37.9, adult: Secondary | ICD-10-CM

## 2021-11-12 DIAGNOSIS — I10 Essential (primary) hypertension: Secondary | ICD-10-CM | POA: Diagnosis not present

## 2021-11-12 DIAGNOSIS — M169 Osteoarthritis of hip, unspecified: Secondary | ICD-10-CM | POA: Diagnosis not present

## 2021-11-12 DIAGNOSIS — F17210 Nicotine dependence, cigarettes, uncomplicated: Secondary | ICD-10-CM | POA: Diagnosis not present

## 2021-11-12 DIAGNOSIS — M5136 Other intervertebral disc degeneration, lumbar region: Secondary | ICD-10-CM | POA: Diagnosis not present

## 2021-11-12 DIAGNOSIS — E1142 Type 2 diabetes mellitus with diabetic polyneuropathy: Secondary | ICD-10-CM | POA: Diagnosis not present

## 2021-11-12 DIAGNOSIS — F32A Depression, unspecified: Secondary | ICD-10-CM | POA: Diagnosis not present

## 2021-11-12 DIAGNOSIS — R600 Localized edema: Secondary | ICD-10-CM | POA: Diagnosis not present

## 2021-11-12 DIAGNOSIS — Z7984 Long term (current) use of oral hypoglycemic drugs: Secondary | ICD-10-CM | POA: Diagnosis not present

## 2021-11-12 DIAGNOSIS — E785 Hyperlipidemia, unspecified: Secondary | ICD-10-CM | POA: Diagnosis not present

## 2021-11-12 DIAGNOSIS — Z7985 Long-term (current) use of injectable non-insulin antidiabetic drugs: Secondary | ICD-10-CM | POA: Diagnosis not present

## 2021-11-12 DIAGNOSIS — E039 Hypothyroidism, unspecified: Secondary | ICD-10-CM | POA: Diagnosis not present

## 2021-11-17 ENCOUNTER — Telehealth: Payer: Self-pay

## 2021-11-17 NOTE — Telephone Encounter (Signed)
errror

## 2021-12-01 ENCOUNTER — Other Ambulatory Visit: Payer: Self-pay | Admitting: Internal Medicine

## 2021-12-01 ENCOUNTER — Telehealth: Payer: Self-pay

## 2021-12-01 DIAGNOSIS — E0843 Diabetes mellitus due to underlying condition with diabetic autonomic (poly)neuropathy: Secondary | ICD-10-CM

## 2021-12-01 DIAGNOSIS — I10 Essential (primary) hypertension: Secondary | ICD-10-CM

## 2021-12-01 MED ORDER — AMLODIPINE BESYLATE 5 MG PO TABS: 5.0000 mg | ORAL_TABLET | Freq: Every day | ORAL | 0 refills | Status: DC

## 2021-12-01 MED ORDER — HYDROCODONE-ACETAMINOPHEN 5-325 MG PO TABS
1.0000 | ORAL_TABLET | Freq: Four times a day (QID) | ORAL | 0 refills | Status: DC | PRN
Start: 2021-12-01 — End: 2023-02-23

## 2021-12-01 NOTE — Telephone Encounter (Signed)
Patient called need med refill  Amlodipine 5 mg  oxycodone 325 mg  [facility]

## 2021-12-01 NOTE — Telephone Encounter (Signed)
Noted  

## 2021-12-07 DIAGNOSIS — Z20822 Contact with and (suspected) exposure to covid-19: Secondary | ICD-10-CM | POA: Diagnosis not present

## 2021-12-07 DIAGNOSIS — E1021 Type 1 diabetes mellitus with diabetic nephropathy: Secondary | ICD-10-CM | POA: Diagnosis not present

## 2021-12-30 DIAGNOSIS — M79676 Pain in unspecified toe(s): Secondary | ICD-10-CM | POA: Diagnosis not present

## 2021-12-30 DIAGNOSIS — B351 Tinea unguium: Secondary | ICD-10-CM | POA: Diagnosis not present

## 2021-12-30 DIAGNOSIS — E1142 Type 2 diabetes mellitus with diabetic polyneuropathy: Secondary | ICD-10-CM | POA: Diagnosis not present

## 2021-12-30 DIAGNOSIS — L84 Corns and callosities: Secondary | ICD-10-CM | POA: Diagnosis not present

## 2021-12-31 ENCOUNTER — Ambulatory Visit: Payer: Medicare Other | Admitting: Endocrinology

## 2022-01-07 DIAGNOSIS — E1021 Type 1 diabetes mellitus with diabetic nephropathy: Secondary | ICD-10-CM | POA: Diagnosis not present

## 2022-01-07 DIAGNOSIS — Z20822 Contact with and (suspected) exposure to covid-19: Secondary | ICD-10-CM | POA: Diagnosis not present

## 2022-01-26 DIAGNOSIS — Z20822 Contact with and (suspected) exposure to covid-19: Secondary | ICD-10-CM | POA: Diagnosis not present

## 2022-01-28 ENCOUNTER — Other Ambulatory Visit: Payer: Self-pay | Admitting: Internal Medicine

## 2022-01-28 DIAGNOSIS — E782 Mixed hyperlipidemia: Secondary | ICD-10-CM

## 2022-02-01 IMAGING — DX DG RIBS 2V*R*
4 series · 4 of 4 positions shown · non-contrast
Comparison: 06/26/2012

CLINICAL DATA: Fall with rib pain

EXAM:
RIGHT RIBS - 2 VIEW

[rib pa]
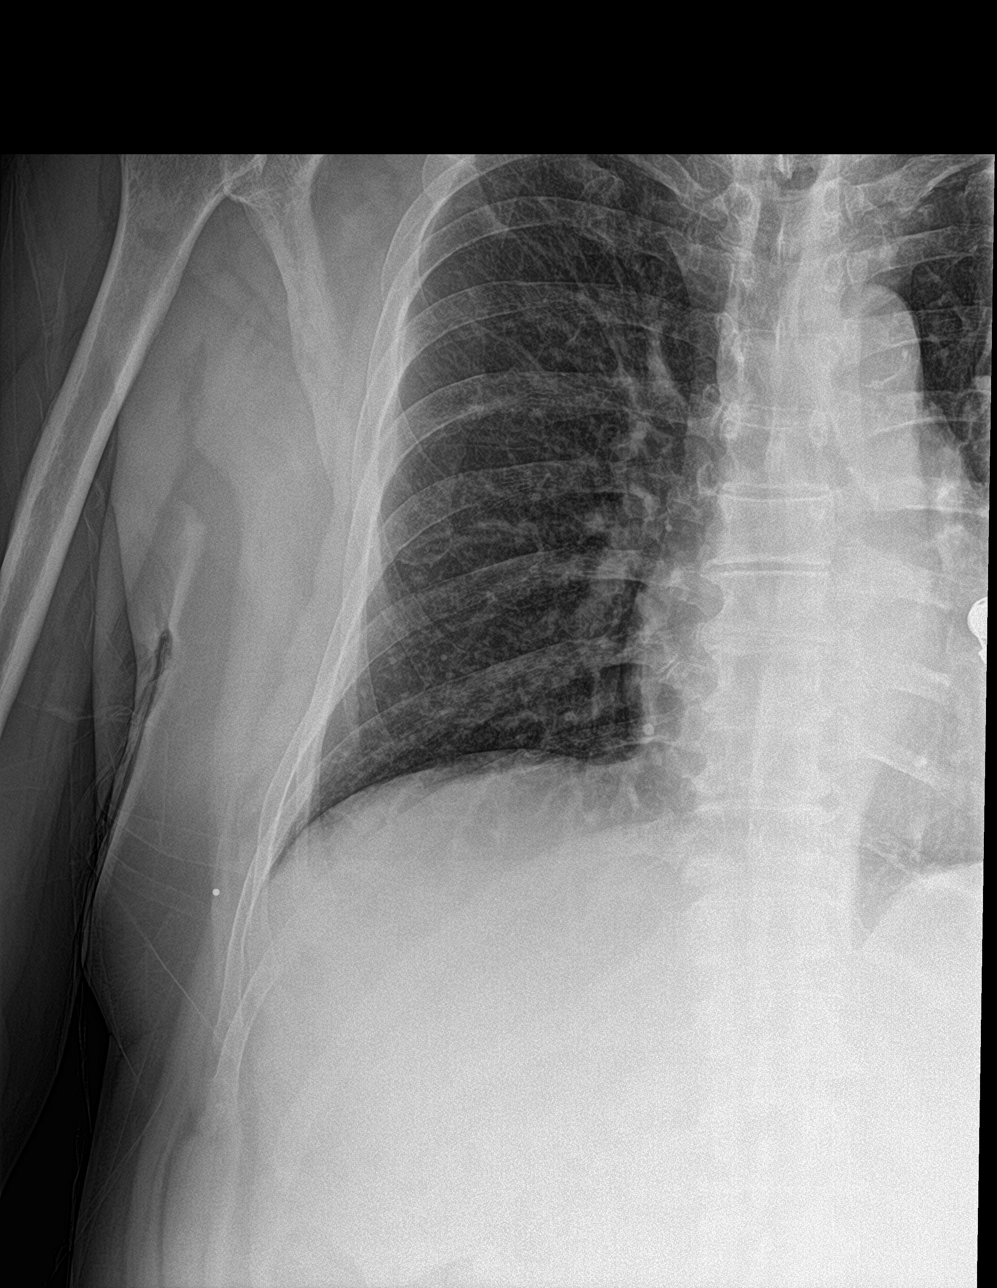

[rib pa obl (1 of 3)]
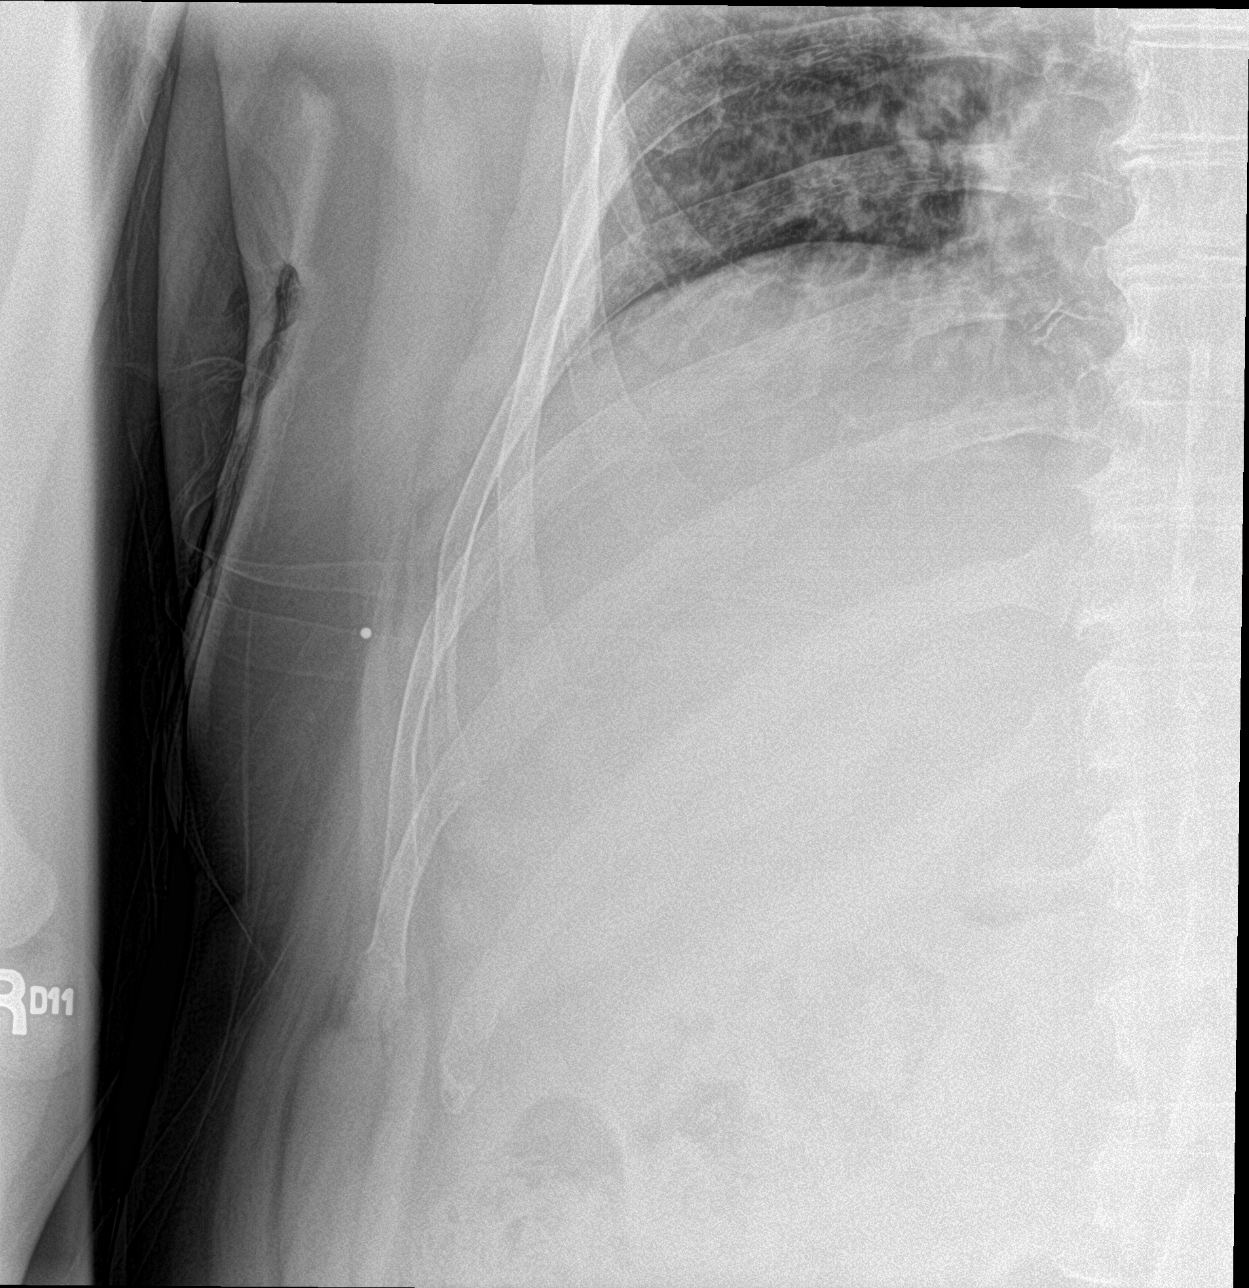

[rib pa obl (2 of 3)]
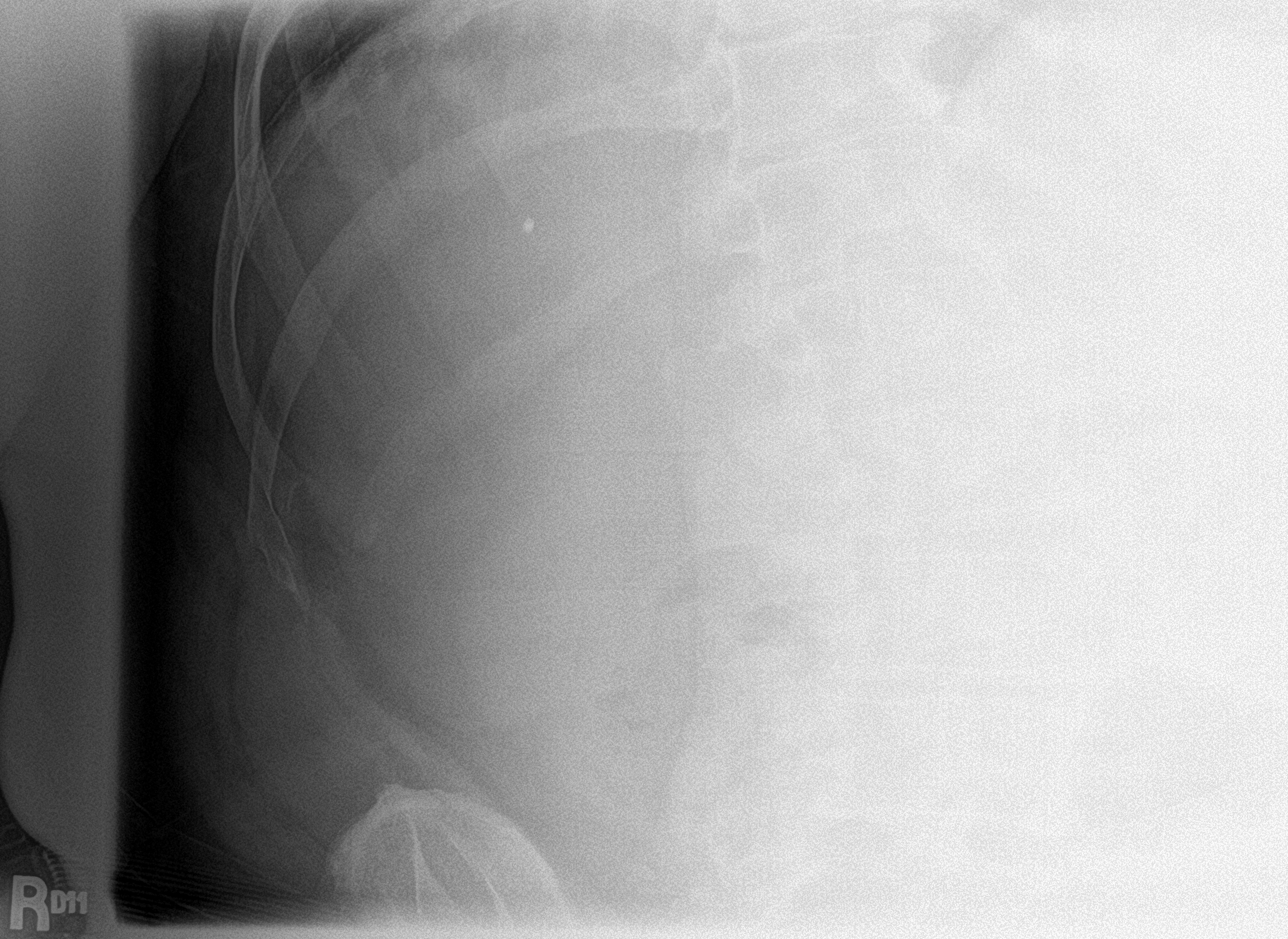

[rib pa obl (3 of 3)]
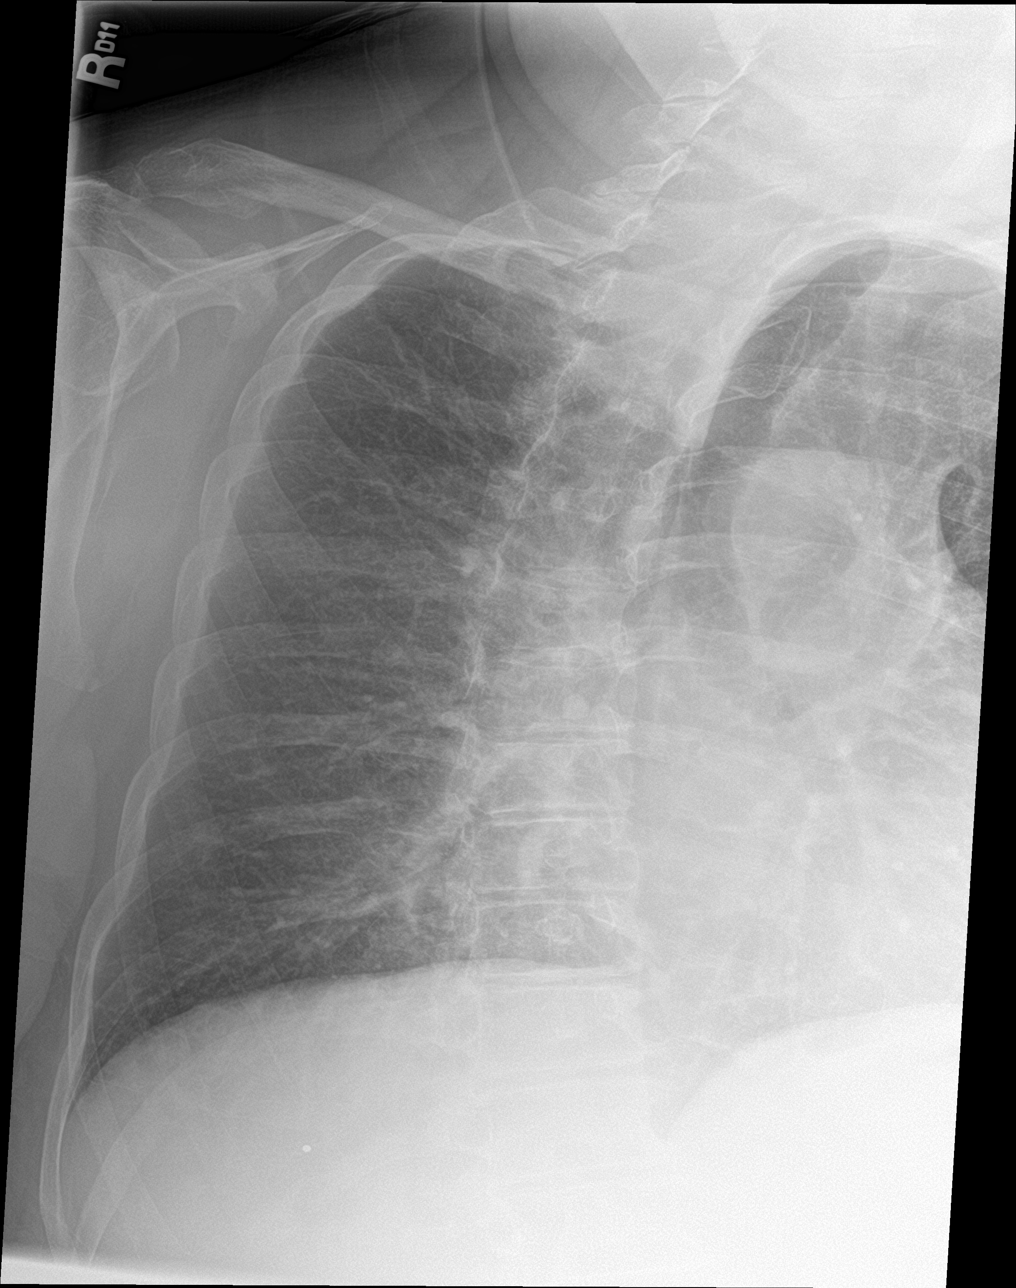

[4 of 4 positions shown; findings below may reference images not displayed]

FINDINGS: Possible acute right eleventh anterolateral rib fracture.
IMPRESSION: Possible acute right eleventh anterolateral rib fracture.

## 2022-02-06 DIAGNOSIS — E1021 Type 1 diabetes mellitus with diabetic nephropathy: Secondary | ICD-10-CM | POA: Diagnosis not present

## 2022-02-06 DIAGNOSIS — Z20822 Contact with and (suspected) exposure to covid-19: Secondary | ICD-10-CM | POA: Diagnosis not present

## 2022-02-22 ENCOUNTER — Encounter: Payer: Self-pay | Admitting: Internal Medicine

## 2022-02-22 ENCOUNTER — Ambulatory Visit (INDEPENDENT_AMBULATORY_CARE_PROVIDER_SITE_OTHER): Payer: Medicare Other | Admitting: Internal Medicine

## 2022-02-22 VITALS — BP 138/78 | HR 82 | Resp 18 | Ht 68.0 in | Wt 244.2 lb

## 2022-02-22 DIAGNOSIS — Z0001 Encounter for general adult medical examination with abnormal findings: Secondary | ICD-10-CM | POA: Insufficient documentation

## 2022-02-22 DIAGNOSIS — E559 Vitamin D deficiency, unspecified: Secondary | ICD-10-CM

## 2022-02-22 DIAGNOSIS — Z720 Tobacco use: Secondary | ICD-10-CM

## 2022-02-22 DIAGNOSIS — M170 Bilateral primary osteoarthritis of knee: Secondary | ICD-10-CM

## 2022-02-22 DIAGNOSIS — Z794 Long term (current) use of insulin: Secondary | ICD-10-CM | POA: Diagnosis not present

## 2022-02-22 DIAGNOSIS — Z23 Encounter for immunization: Secondary | ICD-10-CM

## 2022-02-22 DIAGNOSIS — I10 Essential (primary) hypertension: Secondary | ICD-10-CM

## 2022-02-22 DIAGNOSIS — Z125 Encounter for screening for malignant neoplasm of prostate: Secondary | ICD-10-CM

## 2022-02-22 DIAGNOSIS — E0840 Diabetes mellitus due to underlying condition with diabetic neuropathy, unspecified: Secondary | ICD-10-CM | POA: Diagnosis not present

## 2022-02-22 DIAGNOSIS — E782 Mixed hyperlipidemia: Secondary | ICD-10-CM | POA: Diagnosis not present

## 2022-02-22 DIAGNOSIS — Z1211 Encounter for screening for malignant neoplasm of colon: Secondary | ICD-10-CM

## 2022-02-22 MED ORDER — DEXCOM G7 SENSOR MISC: 5 refills | Status: DC

## 2022-02-22 MED ORDER — DEXCOM G7 RECEIVER DEVI: 0 refills | Status: DC

## 2022-02-22 MED ORDER — AMLODIPINE BESYLATE 5 MG PO TABS: 5.0000 mg | ORAL_TABLET | Freq: Every day | ORAL | 2 refills | Status: DC

## 2022-02-22 MED ORDER — METFORMIN HCL 1000 MG PO TABS: 1000.0000 mg | ORAL_TABLET | Freq: Two times a day (BID) | ORAL | 2 refills | Status: DC

## 2022-02-22 NOTE — Assessment & Plan Note (Signed)

## 2022-02-22 NOTE — Assessment & Plan Note (Signed)
Tylenol PRN ?Has had steroid injections in the past ?Uses cane for walking support ?Has lift chair for assistance to/from chair ?

## 2022-02-22 NOTE — Assessment & Plan Note (Signed)
Asked about quitting: confirms that he currently smokes cigarettes Advise to quit smoking: Educated about QUITTING to reduce the risk of cancer, cardio and cerebrovascular disease. Assess willingness: Unwilling to quit at this time, but is working on cutting back. Assist with counseling and pharmacotherapy: Counseled for 5 minutes and literature provided. Arrange for follow up: follow up in 3 months and continue to offer help. 

## 2022-02-22 NOTE — Assessment & Plan Note (Signed)
BP Readings from Last 1 Encounters:  ?02/22/22 138/78  ? ?Well-controlled with Amlodipine ?Counseled for compliance with the medications ?Advised DASH diet and moderate exercise/walking, at least 150 mins/week ?

## 2022-02-22 NOTE — Assessment & Plan Note (Addendum)
Lab Results  ?Component Value Date  ? HGBA1C 7.3 (A) 08/27/2021  ? ?On Trulicity, Farxiga and Metformin, managed by Endocrinology ?Has severe neuropathic pain with fingersticks, will try to get CGM for better glycemic profile check ?Advised to follow diabetic diet ?On statin ?Diabetic eye exam: Advised to follow up with Ophthalmology for diabetic eye exam ?

## 2022-02-22 NOTE — Progress Notes (Signed)
? ?Established Patient Office Visit ? ?Subjective:  ?Patient ID: Kevin [name], male    DOB: June 22, 1956  Age: 66 y.o. MRN: [id number] ? ?CC:  ?Chief Complaint  ?Patient presents with  ? Annual Exam  ?  Annual exam pt right knee has been hurting comes and goes   ? ? ?HPI ?[name] is a 66 y.o. male with past medical history of DM with neuropathy, DDD of cervical spine and tobacco abuse who presents for annual physical. ? ?Type II DM with neuropathy: He takes metformin, [place] and Trulicity for DM. His last HbA1c was 7.3.  He follows up with endocrinology for it.  He takes gabapentin and duloxetine for diabetic neuropathy. He still c/o neuropathic pain in feet and takes Norco PRN for severe pain. ? ?HTN: BP is well-controlled. Takes medications regularly. Patient denies headache, dizziness, chest pain, dyspnea or palpitations. ? ?He c/o right knee pain, which is intermittent, dull. He has had steroid injections in the past. ? ?He received second dose of Shingrix vaccine today. ? ?Past Medical History:  ?Diagnosis Date  ? Arthritis   ? Chronic knee pain   ? Depression   ? Diabetes mellitus ([place])   ? Hyperlipidemia   ? Hypertension   ? Leg edema   ? Obesity   ? ? ?Past Surgical History:  ?Procedure Laterality Date  ? none    ? ? ?Family History  ?Problem Relation Age of Onset  ? Heart disease Unknown   ? Arthritis Unknown   ? Diabetes Unknown   ? Cancer Mother   ?     deceased  ? ? ?Social History  ? ?Socioeconomic History  ? Marital status: Single  ?  Spouse name: Not on file  ? Number of children: 0  ? Years of education: Not on file  ? Highest education level: Not on file  ?Occupational History  ? Not on file  ?Tobacco Use  ? Smoking status: Every Day  ?  Packs/day: 0.20  ?  Types: Cigarettes  ? Smokeless tobacco: Never  ?Substance and Sexual Activity  ? Alcohol use: No  ? Drug use: No  ? Sexual activity: Not Currently  ?Other Topics Concern  ? Not on file  ?Social History Narrative  ? Patient's POA is  his sister, [name] [phone].    ?   ?   ? ?Social Determinants of Health  ? ?Financial Resource Strain: Low Risk   ? Difficulty of Paying Living Expenses: Not hard at all  ?Food Insecurity: No Food Insecurity  ? Worried About Charity fundraiser in the Last Year: Never true  ? Ran Out of Food in the Last Year: Never true  ?Transportation Needs: No Transportation Needs  ? Lack of Transportation (Medical): No  ? Lack of Transportation (Non-Medical): No  ?Physical Activity: Inactive  ? Days of Exercise per Week: 0 days  ? Minutes of Exercise per Session: 0 min  ?Stress: No Stress Concern Present  ? Feeling of Stress : Not at all  ?Social Connections: Moderately Isolated  ? Frequency of Communication with Friends and Family: Twice a week  ? Frequency of Social Gatherings with Friends and Family: Twice a week  ? Attends Religious Services: More than 4 times per year  ? Active Member of Clubs or Organizations: No  ? Attends Archivist Meetings: Never  ? Marital Status: Never married  ?Intimate Partner Violence: Not At Risk  ? Fear of Current or Ex-Partner: No  ?  Emotionally Abused: No  ? Physically Abused: No  ? Sexually Abused: No  ? ? ?Outpatient Medications Prior to Visit  ?Medication Sig Dispense Refill  ? aspirin EC 81 MG tablet Take 81 mg by mouth daily.    ? clotrimazole (LOTRIMIN) 1 % cream Apply 1 application topically 2 (two) times daily. 30 g 1  ? dapagliflozin propanediol (FARXIGA) 10 MG TABS tablet Take 1 tablet (10 mg total) by mouth daily before breakfast. 90 tablet 3  ? Dulaglutide (TRULICITY) 4.5 KD/3.2IZ SOPN Inject 4.5 mg as directed once a week. 6 mL 3  ? DULoxetine (CYMBALTA) 60 MG capsule Take 1 capsule (60 mg total) by mouth daily. 90 capsule 1  ? folic acid (FOLVITE) 1 MG tablet Take 1 tablet (1 mg total) by mouth daily. 90 tablet 3  ? furosemide (LASIX) 40 MG tablet Take 1 tablet (40 mg total) by mouth 2 (two) times daily. For fluid 180 tablet 3  ? gabapentin (NEURONTIN) 400 MG  capsule Take 1 capsule (400 mg total) by mouth at bedtime. 30 capsule 2  ? Glucose Blood (BLOOD GLUCOSE TEST STRIPS) STRP Use to monitor FSBS 3x daily. Dx: E11.65. (Patient taking differently: 1 each by Other route daily. E11.65.) 100 each 3  ? HYDROcodone-acetaminophen (NORCO/VICODIN) 5-325 MG tablet Take 1 tablet by mouth every 6 (six) hours as needed for moderate pain or severe pain. 30 tablet 0  ? Misc. Devices MISC Lift chair 1 each 0  ? nitroGLYCERIN (NITROSTAT) 0.4 MG SL tablet DISSOLVE 1 TABLET UNDER TONGUE EVERY 5 MINUTES UP TO 15 MIN FOR CHEST PAIN. IF NO RELIEF CALL 911. 25 tablet 0  ? potassium chloride (K-DUR) 10 MEQ tablet Take 1 tablet (10 mEq total) by mouth 2 (two) times daily. Take with fluid pill 180 tablet 3  ? rosuvastatin (CRESTOR) 10 MG tablet TAKE ONE TABLET BY MOUTH ONCE DAILY. 90 tablet 0  ? metFORMIN (GLUCOPHAGE) 1000 MG tablet TAKE 1 TABLET BY MOUTH TWICE DAILY WITH A MEAL FOR DIABETES. 180 tablet 2  ? amLODipine (NORVASC) 5 MG tablet Take 1 tablet (5 mg total) by mouth daily. (Patient not taking: Reported on 02/22/2022) 90 tablet 0  ? ?No facility-administered medications prior to visit.  ? ? ?Allergies  ?Allergen Reactions  ? Lisinopril Cough  ? ? ?ROS ?Review of Systems  ?Constitutional:  Negative for chills and fever.  ?HENT:  Negative for congestion and sore throat.   ?Eyes:  Negative for pain and discharge.  ?Respiratory:  Negative for cough and shortness of breath.   ?Cardiovascular:  Negative for chest pain and palpitations.  ?Gastrointestinal:  Negative for constipation, diarrhea, nausea and vomiting.  ?Endocrine: Negative for polydipsia and polyuria.  ?Genitourinary:  Negative for dysuria and hematuria.  ?Musculoskeletal:  Positive for arthralgias, back pain and gait problem. Negative for neck pain and neck stiffness.  ?Skin:  Negative for rash.  ?Neurological:  Positive for numbness. Negative for dizziness, weakness and headaches.  ?Psychiatric/Behavioral:  Negative for  agitation and behavioral problems.   ? ?  ?Objective:  ?  ?Physical Exam ?Vitals reviewed.  ?Constitutional:   ?   General: He is not in acute distress. ?   Appearance: He is not diaphoretic.  ?   Comments: Has a cane for walking support  ?HENT:  ?   Head: Normocephalic and atraumatic.  ?   Nose: Nose normal.  ?   Mouth/Throat:  ?   Mouth: Mucous membranes are moist.  ?Eyes:  ?   General:  No scleral icterus. ?   Extraocular Movements: Extraocular movements intact.  ?Cardiovascular:  ?   Rate and Rhythm: Normal rate and regular rhythm.  ?   Pulses: Normal pulses.  ?   Heart sounds: Normal heart sounds. No murmur heard. ?Pulmonary:  ?   Breath sounds: Normal breath sounds. No wheezing or rales.  ?Abdominal:  ?   Palpations: Abdomen is soft.  ?   Tenderness: There is no abdominal tenderness.  ?Musculoskeletal:  ?   Cervical back: Neck supple. No tenderness.  ?   Right lower leg: No edema.  ?   Left lower leg: No edema.  ?Skin: ?   General: Skin is warm.  ?   Findings: No rash.  ?Neurological:  ?   General: No focal deficit present.  ?   Mental Status: He is alert and oriented to person, place, and time.  ?   Motor: Weakness (b/l LE, 4/5) present.  ?   Gait: Gait abnormal.  ?   Comments: Rapid and slurred speech, appears to be at baseline  ?Psychiatric:     ?   Mood and Affect: Mood normal.     ?   Behavior: Behavior normal.  ? ? ?BP 138/78 (BP Location: Left Arm, Patient Position: Sitting, Cuff Size: Normal)   Pulse 82   Resp 18   Ht '5\' 8"'$  (1.727 m)   Wt 244 lb 3.2 oz (110.8 kg)   SpO2 96%   BMI 37.13 kg/m?  ?Wt Readings from Last 3 Encounters:  ?02/22/22 244 lb 3.2 oz (110.8 kg)  ?10/25/21 245 lb 1.3 oz (111.2 kg)  ?08/27/21 249 lb (112.9 kg)  ? ? ?Lab Results  ?Component Value Date  ? TSH 1.180 10/25/2021  ? ?Lab Results  ?Component Value Date  ? WBC 9.7 08/07/2020  ? HGB 16.2 08/07/2020  ? HCT 45.4 08/07/2020  ? MCV 94.8 08/07/2020  ? PLT 164 08/07/2020  ? ?Lab Results  ?Component Value Date  ? NA 139  10/25/2021  ? K 4.0 10/25/2021  ? CO2 24 10/25/2021  ? GLUCOSE 85 10/25/2021  ? BUN 13 10/25/2021  ? CREATININE 0.82 10/25/2021  ? BILITOT 0.4 10/25/2021  ? ALKPHOS 93 10/25/2021  ? AST 20 10/25/2021  ? ALT 21 10/25/2021  ? PR

## 2022-02-22 NOTE — Assessment & Plan Note (Signed)
Ordered PSA after discussing its limitations for prostate cancer screening, including false positive results leading additional investigations. 

## 2022-02-22 NOTE — Patient Instructions (Signed)
Please continue taking medications as prescribed. ? ?Please bring your medications in the next visit. ? ?Please follow low carb diet and ambulate as tolerated. ?

## 2022-03-09 DIAGNOSIS — E1021 Type 1 diabetes mellitus with diabetic nephropathy: Secondary | ICD-10-CM | POA: Diagnosis not present

## 2022-03-09 DIAGNOSIS — Z20822 Contact with and (suspected) exposure to covid-19: Secondary | ICD-10-CM | POA: Diagnosis not present

## 2022-03-17 DIAGNOSIS — E1142 Type 2 diabetes mellitus with diabetic polyneuropathy: Secondary | ICD-10-CM | POA: Diagnosis not present

## 2022-03-17 DIAGNOSIS — L84 Corns and callosities: Secondary | ICD-10-CM | POA: Diagnosis not present

## 2022-03-17 DIAGNOSIS — M79676 Pain in unspecified toe(s): Secondary | ICD-10-CM | POA: Diagnosis not present

## 2022-03-17 DIAGNOSIS — B351 Tinea unguium: Secondary | ICD-10-CM | POA: Diagnosis not present

## 2022-04-08 DIAGNOSIS — E1021 Type 1 diabetes mellitus with diabetic nephropathy: Secondary | ICD-10-CM | POA: Diagnosis not present

## 2022-04-25 ENCOUNTER — Ambulatory Visit (INDEPENDENT_AMBULATORY_CARE_PROVIDER_SITE_OTHER): Payer: Medicare Other | Admitting: Internal Medicine

## 2022-04-25 ENCOUNTER — Encounter: Payer: Self-pay | Admitting: Internal Medicine

## 2022-04-25 VITALS — BP 136/70 | HR 76 | Ht 68.0 in | Wt 243.0 lb

## 2022-04-25 DIAGNOSIS — F17210 Nicotine dependence, cigarettes, uncomplicated: Secondary | ICD-10-CM

## 2022-04-25 DIAGNOSIS — E0843 Diabetes mellitus due to underlying condition with diabetic autonomic (poly)neuropathy: Secondary | ICD-10-CM | POA: Diagnosis not present

## 2022-04-25 DIAGNOSIS — L239 Allergic contact dermatitis, unspecified cause: Secondary | ICD-10-CM | POA: Diagnosis not present

## 2022-04-25 DIAGNOSIS — Z720 Tobacco use: Secondary | ICD-10-CM | POA: Diagnosis not present

## 2022-04-25 DIAGNOSIS — E1140 Type 2 diabetes mellitus with diabetic neuropathy, unspecified: Secondary | ICD-10-CM

## 2022-04-25 DIAGNOSIS — E0840 Diabetes mellitus due to underlying condition with diabetic neuropathy, unspecified: Secondary | ICD-10-CM

## 2022-04-25 DIAGNOSIS — Z794 Long term (current) use of insulin: Secondary | ICD-10-CM

## 2022-04-25 DIAGNOSIS — E782 Mixed hyperlipidemia: Secondary | ICD-10-CM | POA: Diagnosis not present

## 2022-04-25 DIAGNOSIS — E559 Vitamin D deficiency, unspecified: Secondary | ICD-10-CM | POA: Diagnosis not present

## 2022-04-25 DIAGNOSIS — I10 Essential (primary) hypertension: Secondary | ICD-10-CM

## 2022-04-25 DIAGNOSIS — Z0001 Encounter for general adult medical examination with abnormal findings: Secondary | ICD-10-CM | POA: Diagnosis not present

## 2022-04-25 MED ORDER — GABAPENTIN 400 MG PO CAPS: 400.0000 mg | ORAL_CAPSULE | Freq: Three times a day (TID) | ORAL | 5 refills | Status: DC

## 2022-04-25 MED ORDER — DULOXETINE HCL 60 MG PO CPEP: 60.0000 mg | ORAL_CAPSULE | Freq: Every day | ORAL | 1 refills | Status: DC

## 2022-04-25 MED ORDER — DAPAGLIFLOZIN PROPANEDIOL 10 MG PO TABS: 10.0000 mg | ORAL_TABLET | Freq: Every day | ORAL | 3 refills | Status: DC

## 2022-04-25 MED ORDER — TRIAMCINOLONE ACETONIDE 0.1 % EX CREA: 1.0000 | TOPICAL_CREAM | Freq: Two times a day (BID) | CUTANEOUS | 0 refills | Status: AC

## 2022-04-25 MED ORDER — TRULICITY 4.5 MG/0.5ML ~~LOC~~ SOAJ: 4.5000 mg | SUBCUTANEOUS | 3 refills | Status: DC

## 2022-04-25 NOTE — Progress Notes (Unsigned)
Established Patient Office Visit  Subjective:  Patient ID: Kevin [name], male    DOB: 1956-10-08  Age: 66 y.o. MRN: [id number]  CC:  Chief Complaint  Patient presents with   Follow-up    DM and HLD   Rash    On arms for a couple of days     HPI Kevin [name] is a 66 y.o. male with past medical history of DM with neuropathy, DDD of cervical spine and tobacco abuse who presents for f/u of his chronic medical conditions.  Type II DM with neuropathy: He takes metformin, [place] and Trulicity for DM. His last HbA1c was 7.3.  He takes gabapentin and duloxetine for diabetic neuropathy. He still c/o neuropathic pain in feet and takes Norco PRN for severe pain.  HTN: BP is well-controlled. Takes medications regularly. Patient denies headache, dizziness, chest pain, dyspnea or palpitations.   He c/o a rash over his b/l forearms for the last 2 days, which is itching. Denies any known insect bite or injury. Denies any recent outdoor activity.    Past Medical History:  Diagnosis Date   Arthritis    Chronic knee pain    Depression    Diabetes mellitus ([place])    Hyperlipidemia    Hypertension    Leg edema    Obesity     Past Surgical History:  Procedure Laterality Date   none      Family History  Problem Relation Age of Onset   Heart disease Unknown    Arthritis Unknown    Diabetes Unknown    Cancer Mother        deceased    Social History   Socioeconomic History   Marital status: Single    Spouse name: Not on file   Number of children: 0   Years of education: Not on file   Highest education level: Not on file  Occupational History   Not on file  Tobacco Use   Smoking status: Every Day    Packs/day: 0.20    Types: Cigarettes   Smokeless tobacco: Never  Substance and Sexual Activity   Alcohol use: No   Drug use: No   Sexual activity: Not Currently  Other Topics Concern   Not on file  Social History Narrative   Patient's POA is his sister, [name] [phone].           Social Determinants of Health   Financial Resource Strain: Low Risk  (06/22/2021)   Overall Financial Resource Strain (CARDIA)    Difficulty of Paying Living Expenses: Not hard at all  Food Insecurity: No Food Insecurity (06/22/2021)   Hunger Vital Sign    Worried About Running Out of Food in the Last Year: Never true    Ran Out of Food in the Last Year: Never true  Transportation Needs: No Transportation Needs (06/22/2021)   PRAPARE - Hydrologist (Medical): No    Lack of Transportation (Non-Medical): No  Physical Activity: Inactive (06/22/2021)   Exercise Vital Sign    Days of Exercise per Week: 0 days    Minutes of Exercise per Session: 0 min  Stress: No Stress Concern Present (06/22/2021)   [place]    Feeling of Stress : Not at all  Social Connections: Moderately Isolated (06/22/2021)   Social Connection and Isolation Panel [NHANES]    Frequency of Communication with Friends and Family: Twice a week  Frequency of Social Gatherings with Friends and Family: Twice a week    Attends Religious Services: More than 4 times per year    Active Member of [employer] or Organizations: No    Attends Archivist Meetings: Never    Marital Status: Never married  Intimate Partner Violence: Not At Risk (06/22/2021)   Humiliation, Afraid, Rape, and Kick questionnaire    Fear of Current or Ex-Partner: No    Emotionally Abused: No    Physically Abused: No    Sexually Abused: No    Outpatient Medications Prior to Visit  Medication Sig Dispense Refill   amLODipine (NORVASC) 5 MG tablet Take 1 tablet (5 mg total) by mouth daily. 90 tablet 2   aspirin EC 81 MG tablet Take 81 mg by mouth daily.     clotrimazole (LOTRIMIN) 1 % cream Apply 1 application topically 2 (two) times daily. 30 g 1   Continuous Blood Gluc Receiver ([place]) DEVI Please use it to check  blood glucose three times before meals, at bedtime and as needed. 1 each 0   Continuous Blood Gluc Sensor (DEXCOM G7 SENSOR) MISC Please use it to check blood glucose three times before meals, at bedtime and as needed. 3 each 5   folic acid (FOLVITE) 1 MG tablet Take 1 tablet (1 mg total) by mouth daily. 90 tablet 3   Glucose Blood (BLOOD GLUCOSE TEST STRIPS) STRP Use to monitor FSBS 3x daily. Dx: E11.65. (Patient taking differently: 1 each by Other route daily. E11.65.) 100 each 3   HYDROcodone-acetaminophen (NORCO/VICODIN) 5-325 MG tablet Take 1 tablet by mouth every 6 (six) hours as needed for moderate pain or severe pain. 30 tablet 0   metFORMIN (GLUCOPHAGE) 1000 MG tablet Take 1 tablet (1,000 mg total) by mouth 2 (two) times daily with a meal. 180 tablet 2   Misc. Devices MISC Lift chair 1 each 0   nitroGLYCERIN (NITROSTAT) 0.4 MG SL tablet DISSOLVE 1 TABLET UNDER TONGUE EVERY 5 MINUTES UP TO 15 MIN FOR CHEST PAIN. IF NO RELIEF CALL 911. 25 tablet 0   potassium chloride (K-DUR) 10 MEQ tablet Take 1 tablet (10 mEq total) by mouth 2 (two) times daily. Take with fluid pill 180 tablet 3   rosuvastatin (CRESTOR) 10 MG tablet TAKE ONE TABLET BY MOUTH ONCE DAILY. 90 tablet 0   dapagliflozin propanediol (FARXIGA) 10 MG TABS tablet Take 1 tablet (10 mg total) by mouth daily before breakfast. 90 tablet 3   Dulaglutide (TRULICITY) 4.5 VO/3.5KK SOPN Inject 4.5 mg as directed once a week. 6 mL 3   DULoxetine (CYMBALTA) 60 MG capsule Take 1 capsule (60 mg total) by mouth daily. 90 capsule 1   furosemide (LASIX) 40 MG tablet Take 1 tablet (40 mg total) by mouth 2 (two) times daily. For fluid 180 tablet 3   gabapentin (NEURONTIN) 400 MG capsule Take 1 capsule (400 mg total) by mouth at bedtime. 30 capsule 2   No facility-administered medications prior to visit.    Allergies  Allergen Reactions   Lisinopril Cough    ROS Review of Systems  Constitutional:  Negative for chills and fever.  HENT:   Negative for congestion and sore throat.   Eyes:  Negative for pain and discharge.  Respiratory:  Negative for cough and shortness of breath.   Cardiovascular:  Negative for chest pain and palpitations.  Gastrointestinal:  Negative for constipation, diarrhea, nausea and vomiting.  Endocrine: Negative for polydipsia and polyuria.  Genitourinary:  Negative for dysuria and hematuria.  Musculoskeletal:  Positive for arthralgias, back pain and gait problem. Negative for neck pain and neck stiffness.  Skin:  Positive for rash.  Neurological:  Positive for numbness. Negative for dizziness, weakness and headaches.  Psychiatric/Behavioral:  Negative for agitation and behavioral problems.       Objective:    Physical Exam Vitals reviewed.  Constitutional:      General: He is not in acute distress.    Appearance: He is not diaphoretic.     Comments: Has a cane for walking support  HENT:     Head: Normocephalic and atraumatic.     Nose: Nose normal.     Mouth/Throat:     Mouth: Mucous membranes are moist.  Eyes:     General: No scleral icterus.    Extraocular Movements: Extraocular movements intact.  Cardiovascular:     Rate and Rhythm: Normal rate and regular rhythm.     Pulses: Normal pulses.     Heart sounds: Normal heart sounds. No murmur heard. Pulmonary:     Breath sounds: Normal breath sounds. No wheezing or rales.  Abdominal:     Palpations: Abdomen is soft.     Tenderness: There is no abdominal tenderness.  Musculoskeletal:     Cervical back: Neck supple. No tenderness.     Right lower leg: No edema.     Left lower leg: No edema.  Skin:    General: Skin is warm.     Findings: Rash (Maculopapular rash over erythematous base - over b/l upper extremity) present.  Neurological:     General: No focal deficit present.     Mental Status: He is alert and oriented to person, place, and time.     Motor: Weakness (b/l LE, 4/5) present.     Gait: Gait abnormal.     Comments: Rapid  and slurred speech, appears to be at baseline  Psychiatric:        Mood and Affect: Mood normal.        Behavior: Behavior normal.     BP 136/70   Pulse 76   Ht '5\' 8"'  (1.727 m)   Wt 243 lb (110.2 kg)   SpO2 96%   BMI 36.95 kg/m  Wt Readings from Last 3 Encounters:  04/25/22 243 lb (110.2 kg)  02/22/22 244 lb 3.2 oz (110.8 kg)  10/25/21 245 lb 1.3 oz (111.2 kg)    Lab Results  Component Value Date   TSH 0.919 04/25/2022   Lab Results  Component Value Date   WBC 9.2 04/25/2022   HGB 17.0 04/25/2022   HCT 49.0 04/25/2022   MCV 93 04/25/2022   PLT 191 04/25/2022   Lab Results  Component Value Date   NA 140 04/25/2022   K 4.8 04/25/2022   CO2 24 04/25/2022   GLUCOSE 172 (H) 04/25/2022   BUN 11 04/25/2022   CREATININE 0.83 04/25/2022   BILITOT 0.5 04/25/2022   ALKPHOS 82 04/25/2022   AST 21 04/25/2022   ALT 16 04/25/2022   PROT 7.1 04/25/2022   ALBUMIN 4.3 04/25/2022   CALCIUM 9.4 04/25/2022   ANIONGAP 12 01/25/2018   EGFR 98 04/25/2022   GFR 94.60 08/27/2021   Lab Results  Component Value Date   CHOL 110 04/25/2022   Lab Results  Component Value Date   HDL 41 04/25/2022   Lab Results  Component Value Date   LDLCALC 52 04/25/2022   Lab Results  Component Value Date   TRIG  89 04/25/2022   Lab Results  Component Value Date   CHOLHDL 2.7 04/25/2022   Lab Results  Component Value Date   HGBA1C 6.8 (H) 04/25/2022      Assessment & Plan:   Problem List Items Addressed This Visit       Cardiovascular and Mediastinum   Essential hypertension, benign    BP Readings from Last 1 Encounters:  04/25/22 136/70  Well-controlled with Amlodipine Counseled for compliance with the medications Advised DASH diet and moderate exercise/walking, at least 150 mins/week        Endocrine   Diabetes mellitus with neurological manifestation ([place]) - Primary    Lab Results  Component Value Date   HGBA1C 6.8 (H) 04/25/2022  Well-controlled now On Trulicity,  Farxiga and Metformin Advised to follow diabetic diet On statin Diabetic eye exam: Advised to follow up with Ophthalmology for diabetic eye exam      Relevant Medications   dapagliflozin propanediol (FARXIGA) 10 MG TABS tablet   Dulaglutide (TRULICITY) 4.5 DH/7.4BU SOPN   Neuropathy, diabetic (HCC)    On Gabapentin and Cymbalta Takes Norco PRN, advised patient to avoid taking Norco - refilled for now after reviewing PDMP  Difficulty getting up from chair/bed, likely due to peripheral neuropathy in addition to hip OA and DDD of lumbar spine      Relevant Medications   dapagliflozin propanediol (FARXIGA) 10 MG TABS tablet   Dulaglutide (TRULICITY) 4.5 LA/4.5XM SOPN   DULoxetine (CYMBALTA) 60 MG capsule   gabapentin (NEURONTIN) 400 MG capsule     Musculoskeletal and Integument   Allergic contact dermatitis    Rash over forearms likely contact dermatitis Started Kenalog cream      Relevant Medications   triamcinolone cream (KENALOG) 0.1 %     Other   Hyperlipidemia    On Crestor Check lipid profile      Tobacco abuse    Asked about quitting: confirms that he currently smokes cigarettes Advise to quit smoking: Educated about QUITTING to reduce the risk of cancer, cardio and cerebrovascular disease. Assess willingness: Unwilling to quit at this time, but is working on cutting back. Assist with counseling and pharmacotherapy: Counseled for 5 minutes and literature provided. Arrange for follow up: follow up in 3 months and continue to offer help.       Meds ordered this encounter  Medications   dapagliflozin propanediol (FARXIGA) 10 MG TABS tablet    Sig: Take 1 tablet (10 mg total) by mouth daily before breakfast.    Dispense:  90 tablet    Refill:  3   Dulaglutide (TRULICITY) 4.5 IW/8.0HO SOPN    Sig: Inject 4.5 mg as directed once a week.    Dispense:  6 mL    Refill:  3   DULoxetine (CYMBALTA) 60 MG capsule    Sig: Take 1 capsule (60 mg total) by mouth daily.     Dispense:  90 capsule    Refill:  1   gabapentin (NEURONTIN) 400 MG capsule    Sig: Take 1 capsule (400 mg total) by mouth 3 (three) times daily.    Dispense:  90 capsule    Refill:  5   triamcinolone cream (KENALOG) 0.1 %    Sig: Apply 1 Application topically 2 (two) times daily.    Dispense:  30 g    Refill:  0    Follow-up: Return in about 4 months (around 08/26/2022) for DM and HTN.    Lindell Spar, MD

## 2022-04-25 NOTE — Patient Instructions (Signed)
Please continue taking medications as prescribed.  Please continue to follow low carb diet and ambulate as tolerated. 

## 2022-04-26 ENCOUNTER — Telehealth: Payer: Self-pay

## 2022-04-26 NOTE — Telephone Encounter (Signed)
Patient returning lab results call 

## 2022-04-26 NOTE — Telephone Encounter (Signed)
LVM for pt to call the office 2nd attempt

## 2022-04-27 ENCOUNTER — Encounter: Payer: Self-pay | Admitting: Internal Medicine

## 2022-04-27 LAB — CBC WITH DIFFERENTIAL/PLATELET
Basophils Absolute: 0.1 10*3/uL (ref 0.0–0.2)
Basos: 1 %
EOS (ABSOLUTE): 0.3 10*3/uL (ref 0.0–0.4)
Eos: 3 %
Hematocrit: 49 % (ref 37.5–51.0)
Hemoglobin: 17 g/dL (ref 13.0–17.7)
Immature Grans (Abs): 0 10*3/uL (ref 0.0–0.1)
Immature Granulocytes: 0 %
Lymphocytes Absolute: 2.3 10*3/uL (ref 0.7–3.1)
Lymphs: 25 %
MCH: 32.1 pg (ref 26.6–33.0)
MCHC: 34.7 g/dL (ref 31.5–35.7)
MCV: 93 fL (ref 79–97)
Monocytes Absolute: 0.5 10*3/uL (ref 0.1–0.9)
Monocytes: 5 %
Neutrophils Absolute: 6 10*3/uL (ref 1.4–7.0)
Neutrophils: 66 %
Platelets: 191 10*3/uL (ref 150–450)
RBC: 5.29 x10E6/uL (ref 4.14–5.80)
RDW: 11 % — ABNORMAL LOW (ref 11.6–15.4)
WBC: 9.2 10*3/uL (ref 3.4–10.8)

## 2022-04-27 LAB — CMP14+EGFR
ALT: 16 IU/L (ref 0–44)
AST: 21 IU/L (ref 0–40)
Albumin/Globulin Ratio: 1.5 (ref 1.2–2.2)
Albumin: 4.3 g/dL (ref 3.9–4.9)
Alkaline Phosphatase: 82 IU/L (ref 44–121)
BUN/Creatinine Ratio: 13 (ref 10–24)
BUN: 11 mg/dL (ref 8–27)
Bilirubin Total: 0.5 mg/dL (ref 0.0–1.2)
CO2: 24 mmol/L (ref 20–29)
Calcium: 9.4 mg/dL (ref 8.6–10.2)
Chloride: 101 mmol/L (ref 96–106)
Creatinine, Ser: 0.83 mg/dL (ref 0.76–1.27)
Globulin, Total: 2.8 g/dL (ref 1.5–4.5)
Glucose: 172 mg/dL — ABNORMAL HIGH (ref 70–99)
Potassium: 4.8 mmol/L (ref 3.5–5.2)
Sodium: 140 mmol/L (ref 134–144)
Total Protein: 7.1 g/dL (ref 6.0–8.5)
eGFR: 98 mL/min/{1.73_m2} (ref >59)

## 2022-04-27 LAB — LIPID PANEL
Chol/HDL Ratio: 2.7 ratio (ref 0.0–5.0)
Cholesterol, Total: 110 mg/dL (ref 100–199)
HDL: 41 mg/dL (ref >39)
LDL Chol Calc (NIH): 52 mg/dL (ref 0–99)
Triglycerides: 89 mg/dL (ref 0–149)
VLDL Cholesterol Cal: 17 mg/dL (ref 5–40)

## 2022-04-27 LAB — PSA: Prostate Specific Ag, Serum: 0.2 ng/mL (ref 0.0–4.0)

## 2022-04-27 LAB — VITAMIN D 25 HYDROXY (VIT D DEFICIENCY, FRACTURES): Vit D, 25-Hydroxy: 25.3 ng/mL — ABNORMAL LOW (ref 30.0–100.0)

## 2022-04-27 LAB — HEMOGLOBIN A1C
Est. average glucose Bld gHb Est-mCnc: 148 mg/dL
Hgb A1c MFr Bld: 6.8 % — ABNORMAL HIGH (ref 4.8–5.6)

## 2022-04-27 LAB — TSH: TSH: 0.919 u[IU]/mL (ref 0.450–4.500)

## 2022-04-27 NOTE — Assessment & Plan Note (Signed)
On Crestor Check lipid profile 

## 2022-04-27 NOTE — Assessment & Plan Note (Signed)
Rash over forearms likely contact dermatitis Started Kenalog cream

## 2022-04-27 NOTE — Assessment & Plan Note (Signed)
BP Readings from Last 1 Encounters:  04/25/22 136/70   Well-controlled with Amlodipine Counseled for compliance with the medications Advised DASH diet and moderate exercise/walking, at least 150 mins/week

## 2022-04-27 NOTE — Assessment & Plan Note (Signed)
Asked about quitting: confirms that he currently smokes cigarettes Advise to quit smoking: Educated about QUITTING to reduce the risk of cancer, cardio and cerebrovascular disease. Assess willingness: Unwilling to quit at this time, but is working on cutting back. Assist with counseling and pharmacotherapy: Counseled for 5 minutes and literature provided. Arrange for follow up: follow up in 3 months and continue to offer help.

## 2022-04-27 NOTE — Assessment & Plan Note (Signed)
Lab Results  Component Value Date   HGBA1C 6.8 (H) 04/25/2022   Well-controlled now On Trulicity, Farxiga and Metformin Advised to follow diabetic diet On statin Diabetic eye exam: Advised to follow up with Ophthalmology for diabetic eye exam

## 2022-04-27 NOTE — Assessment & Plan Note (Signed)
On Gabapentin and Cymbalta Takes Norco PRN, advised patient to avoid taking Norco - refilled for now after reviewing PDMP  Difficulty getting up from chair/bed, likely due to peripheral neuropathy in addition to hip OA and DDD of lumbar spine

## 2022-05-04 DIAGNOSIS — E119 Type 2 diabetes mellitus without complications: Secondary | ICD-10-CM | POA: Diagnosis not present

## 2022-05-05 ENCOUNTER — Other Ambulatory Visit: Payer: Self-pay

## 2022-05-05 ENCOUNTER — Telehealth: Payer: Self-pay

## 2022-05-05 DIAGNOSIS — E782 Mixed hyperlipidemia: Secondary | ICD-10-CM

## 2022-05-05 MED ORDER — ROSUVASTATIN CALCIUM 10 MG PO TABS: 10.0000 mg | ORAL_TABLET | Freq: Every day | ORAL | 1 refills | Status: DC

## 2022-05-05 NOTE — Telephone Encounter (Signed)
Patient called need refill  rosuvastatin (CRESTOR) 10 MG tablet   Pharmacy: [facility]

## 2022-05-05 NOTE — Telephone Encounter (Signed)
Refill sent.

## 2022-05-09 DIAGNOSIS — E1021 Type 1 diabetes mellitus with diabetic nephropathy: Secondary | ICD-10-CM | POA: Diagnosis not present

## 2022-06-16 DIAGNOSIS — M79676 Pain in unspecified toe(s): Secondary | ICD-10-CM | POA: Diagnosis not present

## 2022-06-16 DIAGNOSIS — B351 Tinea unguium: Secondary | ICD-10-CM | POA: Diagnosis not present

## 2022-06-16 DIAGNOSIS — E1142 Type 2 diabetes mellitus with diabetic polyneuropathy: Secondary | ICD-10-CM | POA: Diagnosis not present

## 2022-06-16 DIAGNOSIS — L84 Corns and callosities: Secondary | ICD-10-CM | POA: Diagnosis not present

## 2022-06-24 NOTE — Progress Notes (Signed)
[facility] Quality Team Note  Name: Kevin [name] Date of Birth: 06-28-1956 MRN: [id number] Date: 06/24/2022  [facility] Quality Team has reviewed this patient's chart, please see recommendations below:  [facility] Quality Other; (KED: Kidney Health Evaluation Gap- Patient needs Urine Albumin Creatinine Ratio Test completed for gap closure. EGFR has already been completed, Patient has upcoming appointment with [facility] 09/12/2022.)

## 2022-06-30 ENCOUNTER — Ambulatory Visit (INDEPENDENT_AMBULATORY_CARE_PROVIDER_SITE_OTHER): Payer: Medicare Other

## 2022-06-30 DIAGNOSIS — Z0000 Encounter for general adult medical examination without abnormal findings: Secondary | ICD-10-CM

## 2022-06-30 NOTE — Patient Instructions (Signed)
  Kevin [name] , Thank you for taking time to come for your Medicare Wellness Visit. I appreciate your ongoing commitment to your health goals. Please review the following plan we discussed and let me know if I can assist you in the future.   These are the goals we discussed:  Goals       "I want to get my sugar down because now I have to take insulin."  (pt-stated)      HEMOGLOBIN A1C < 7.0      Patient Stated      Keep sugar down.        This is a list of the screening recommended for you and due dates:  Health Maintenance  Topic Date Due   COVID-19 Vaccine (1) Never done   Tetanus Vaccine  Never done   Colon Cancer Screening  Never done   Yearly kidney health urinalysis for diabetes  05/31/2016   Stool Blood Test  11/07/2020   Eye exam for diabetics  05/03/2022   Flu Shot  05/10/2022   Complete foot exam   05/27/2022   Hemoglobin A1C  10/26/2022   Yearly kidney function blood test for diabetes  04/26/2023   Hepatitis C Screening: USPSTF Recommendation to screen - Ages 18-79 yo.  Completed   HIV Screening  Completed   Zoster (Shingles) Vaccine  Completed   HPV Vaccine  Aged Out

## 2022-06-30 NOTE — Progress Notes (Signed)
Subjective:   Kevin [name] is a 66 y.o. male who presents for Medicare Annual/Subsequent preventive examination.  Review of Systems    I connected with  Kevin [name] on 06/30/22 by a audio enabled telemedicine application and verified that I am speaking with the correct person using two identifiers.  Patient Location: Home  Provider Location: Office/Clinic  I discussed the limitations of evaluation and management by telemedicine. The patient expressed understanding and agreed to proceed.        Objective:    There were no vitals filed for this visit. There is no height or weight on file to calculate BMI.     06/22/2021    4:40 PM 03/31/2020    2:41 PM 03/29/2019    2:40 PM 01/25/2018   12:12 PM 01/11/2017   10:56 AM 12/28/2016    1:02 PM 08/31/2015    1:21 PM  Advanced Directives  Does Patient Have a Medical Advance Directive? No No No No No No Yes  Type of Advance Directive       [place]  Does patient want to make changes to medical advance directive?       No - Patient declined  Copy of [place] in Chart?       No - copy requested  Would patient like information on creating a medical advance directive?   No - Patient declined No - Patient declined No - Patient declined No - Patient declined No - patient declined information    Current Medications (verified) Outpatient Encounter Medications as of 06/30/2022  Medication Sig   amLODipine (NORVASC) 5 MG tablet Take 1 tablet (5 mg total) by mouth daily.   aspirin EC 81 MG tablet Take 81 mg by mouth daily.   clotrimazole (LOTRIMIN) 1 % cream Apply 1 application topically 2 (two) times daily.   Continuous Blood Gluc Receiver ([place]) DEVI Please use it to check blood glucose three times before meals, at bedtime and as needed.   Continuous Blood Gluc Sensor (DEXCOM G7 SENSOR) MISC Please use it to check blood glucose three times before meals, at bedtime and as needed.    dapagliflozin propanediol (FARXIGA) 10 MG TABS tablet Take 1 tablet (10 mg total) by mouth daily before breakfast.   Dulaglutide (TRULICITY) 4.5 RC/7.8LF SOPN Inject 4.5 mg as directed once a week.   DULoxetine (CYMBALTA) 60 MG capsule Take 1 capsule (60 mg total) by mouth daily.   folic acid (FOLVITE) 1 MG tablet Take 1 tablet (1 mg total) by mouth daily.   gabapentin (NEURONTIN) 400 MG capsule Take 1 capsule (400 mg total) by mouth 3 (three) times daily.   Glucose Blood (BLOOD GLUCOSE TEST STRIPS) STRP Use to monitor FSBS 3x daily. Dx: E11.65. (Patient taking differently: 1 each by Other route daily. E11.65.)   HYDROcodone-acetaminophen (NORCO/VICODIN) 5-325 MG tablet Take 1 tablet by mouth every 6 (six) hours as needed for moderate pain or severe pain.   metFORMIN (GLUCOPHAGE) 1000 MG tablet Take 1 tablet (1,000 mg total) by mouth 2 (two) times daily with a meal.   Misc. Devices MISC Lift chair   nitroGLYCERIN (NITROSTAT) 0.4 MG SL tablet DISSOLVE 1 TABLET UNDER TONGUE EVERY 5 MINUTES UP TO 15 MIN FOR CHEST PAIN. IF NO RELIEF CALL 911.   potassium chloride (K-DUR) 10 MEQ tablet Take 1 tablet (10 mEq total) by mouth 2 (two) times daily. Take with fluid pill   rosuvastatin (CRESTOR) 10 MG tablet  Take 1 tablet (10 mg total) by mouth daily.   triamcinolone cream (KENALOG) 0.1 % Apply 1 Application topically 2 (two) times daily.   No facility-administered encounter medications on file as of 06/30/2022.    Allergies (verified) Lisinopril   History: Past Medical History:  Diagnosis Date   Arthritis    Chronic knee pain    Depression    Diabetes mellitus ([place])    Hyperlipidemia    Hypertension    Leg edema    Obesity    Past Surgical History:  Procedure Laterality Date   none     Family History  Problem Relation Age of Onset   Heart disease Unknown    Arthritis Unknown    Diabetes Unknown    Cancer Mother        deceased   Social History   Socioeconomic History   Marital  status: Single    Spouse name: Not on file   Number of children: 0   Years of education: Not on file   Highest education level: Not on file  Occupational History   Not on file  Tobacco Use   Smoking status: Every Day    Packs/day: 0.20    Types: Cigarettes   Smokeless tobacco: Never  Substance and Sexual Activity   Alcohol use: No   Drug use: No   Sexual activity: Not Currently  Other Topics Concern   Not on file  Social History Narrative   Patient's POA is his sister, [name] [phone].           Social Determinants of Health   Financial Resource Strain: Low Risk  (06/22/2021)   Overall Financial Resource Strain (CARDIA)    Difficulty of Paying Living Expenses: Not hard at all  Food Insecurity: No Food Insecurity (06/22/2021)   Hunger Vital Sign    Worried About Running Out of Food in the Last Year: Never true    Ran Out of Food in the Last Year: Never true  Transportation Needs: No Transportation Needs (06/22/2021)   PRAPARE - Hydrologist (Medical): No    Lack of Transportation (Non-Medical): No  Physical Activity: Inactive (06/22/2021)   Exercise Vital Sign    Days of Exercise per Week: 0 days    Minutes of Exercise per Session: 0 min  Stress: No Stress Concern Present (06/22/2021)   [place]    Feeling of Stress : Not at all  Social Connections: Moderately Isolated (06/22/2021)   Social Connection and Isolation Panel [NHANES]    Frequency of Communication with Friends and Family: Twice a week    Frequency of Social Gatherings with Friends and Family: Twice a week    Attends Religious Services: More than 4 times per year    Active Member of [employer] or Organizations: No    Attends Music therapist: Never    Marital Status: Never married    Tobacco Counseling Ready to quit: Not Answered Counseling given: Not Answered   Clinical Intake:                  Diabetic?yes Nutrition Risk Assessment:  Has the patient had any N/V/D within the last 2 months?  No  Does the patient have any non-healing wounds?  No  Has the patient had any unintentional weight loss or weight gain?  No   Diabetes:  Is the patient diabetic?  Yes  If diabetic, was a CBG obtained  today?  No  Did the patient bring in their glucometer from home?  No  How often do you monitor your CBG's? 2 times daily.   Financial Strains and Diabetes Management:  Are you having any financial strains with the device, your supplies or your medication? No .  Does the patient want to be seen by Chronic Care Management for management of their diabetes?  No  Would the patient like to be referred to a Nutritionist or for Diabetic Management?  No   Diabetic Exams:  Diabetic Eye Exam: Completed 05/03/21 Diabetic Foot Exam: Completed 05/27/21           Activities of Daily Living     No data to display           Patient Care Team: Lindell Spar, MD as PCP - General (Internal Medicine) Gala Romney, Cristopher Estimable, MD (Gastroenterology) Buelah Manis, Modena Nunnery, MD (Family Medicine)  Indicate any recent Medical Services you may have received from other than [facility] providers in the past year (date may be approximate).     Assessment:   This is a routine wellness examination for [place].  Hearing/Vision screen No results found.  Dietary issues and exercise activities discussed:     Goals Addressed   None   Depression Screen    04/25/2022    2:49 PM 02/22/2022    3:16 PM 10/25/2021    3:10 PM 06/24/2021    3:05 PM 06/22/2021    4:38 PM 01/12/2021    2:34 PM 03/31/2020    2:36 PM  PHQ 2/9 Scores  PHQ - 2 Score 0 0 0 0 0 0 0  PHQ- 9 Score       0    Fall Risk    04/25/2022    2:49 PM 02/22/2022    3:16 PM 10/25/2021    3:10 PM 06/24/2021    3:04 PM 06/22/2021    4:40 PM  [place] in the past year? 0 0 1 1 0  Number falls in past yr: 0 0 1 0 0  Injury with Fall? 0 0  1 1 0  Risk for fall due to : No Fall Risks No Fall Risks History of fall(s);Impaired balance/gait History of fall(s) No Fall Risks  Follow up Falls evaluation completed Falls evaluation completed Falls evaluation completed;Falls prevention discussed Falls evaluation completed;Education provided;Falls prevention discussed Falls evaluation completed    FALL RISK PREVENTION PERTAINING TO THE HOME:  Any stairs in or around the home? No  If so, are there any without handrails? No  Home free of loose throw rugs in walkways, pet beds, electrical cords, etc? Yes  Adequate lighting in your home to reduce risk of falls? Yes   ASSISTIVE DEVICES UTILIZED TO PREVENT FALLS:  Life alert? No  Use of a cane, walker or w/c? Yes  Grab bars in the bathroom? No  Shower chair or bench in shower? Yes  Elevated toilet seat or a handicapped toilet? No       06/22/2021    4:42 PM  6CIT Screen  What Year? 0 points  What month? 0 points  What time? 0 points  Count back from 20 0 points  Months in reverse 0 points  Repeat phrase 2 points  Total Score 2 points    Immunizations Immunization History  Administered Date(s) Administered   Influenza Split 06/26/2012   Influenza,inj,Quad PF,6+ Mos 08/19/2013, 08/05/2016, 07/17/2017, 07/27/2018, 07/30/2019, 08/07/2020, 06/24/2021   Influenza-Unspecified 08/05/2014   Pneumococcal  Polysaccharide-23 11/19/2013   Zoster Recombinat (Shingrix) 10/25/2021, 02/22/2022    TDAP status: Due, Education has been provided regarding the importance of this vaccine. Advised may receive this vaccine at local pharmacy or Health Dept. Aware to provide a copy of the vaccination record if obtained from local pharmacy or Health Dept. Verbalized acceptance and understanding.  Flu Vaccine status: Due, Education has been provided regarding the importance of this vaccine. Advised may receive this vaccine at local pharmacy or Health Dept. Aware to provide a copy of the vaccination  record if obtained from local pharmacy or Health Dept. Verbalized acceptance and understanding.    Covid-19 vaccine status: Information provided on how to obtain vaccines.   Qualifies for Shingles Vaccine? Yes   Zostavax completed Yes   Shingrix Completed?: Yes  Screening Tests Health Maintenance  Topic Date Due   COVID-19 Vaccine (1) Never done   TETANUS/TDAP  Never done   COLONOSCOPY (Pts 45-38yr Insurance coverage will need to be confirmed)  Never done   Diabetic kidney evaluation - Urine ACR  05/31/2016   COLON CANCER SCREENING ANNUAL FOBT  11/07/2020   OPHTHALMOLOGY EXAM  05/03/2022   INFLUENZA VACCINE  05/10/2022   FOOT EXAM  05/27/2022   HEMOGLOBIN A1C  10/26/2022   Diabetic kidney evaluation - GFR measurement  04/26/2023   Hepatitis C Screening  Completed   HIV Screening  Completed   Zoster Vaccines- Shingrix  Completed   HPV VACCINES  Aged Out    Health Maintenance  Health Maintenance Due  Topic Date Due   COVID-19 Vaccine (1) Never done   TETANUS/TDAP  Never done   COLONOSCOPY (Pts 45-423yrInsurance coverage will need to be confirmed)  Never done   Diabetic kidney evaluation - Urine ACR  05/31/2016   COLON CANCER SCREENING ANNUAL FOBT  11/07/2020   OPHTHALMOLOGY EXAM  05/03/2022   INFLUENZA VACCINE  05/10/2022   FOOT EXAM  05/27/2022    Colorectal cancer screening: Referral to GI placed  . Pt aware the office will call re: appt.  Lung Cancer Screening: (Low Dose CT Chest recommended if Age 65-80ears, 30 pack-year currently smoking OR have quit w/in 15years.) does not qualify.   Lung Cancer Screening Referral: no  Additional Screening:  Hepatitis C Screening: does qualify; Completed 09/29/19  Vision Screening: Recommended annual ophthalmology exams for early detection of glaucoma and other disorders of the eye. Is the patient up to date with their annual eye exam?  No  Who is the provider or what is the name of the office in which the patient  attends annual eye exams? N/a If pt is not established with a provider, would they like to be referred to a provider to establish care? No .   Dental Screening: Recommended annual dental exams for proper oral hygiene  Community Resource Referral / Chronic Care Management: CRR required this visit?  No   CCM required this visit?  No      Plan:     I have personally reviewed and noted the following in the patient's chart:   Medical and social history Use of alcohol, tobacco or illicit drugs  Current medications and supplements including opioid prescriptions. Patient is currently taking opioid prescriptions. Information provided to patient regarding non-opioid alternatives. Patient advised to discuss non-opioid treatment plan with their provider. Functional ability and status Nutritional status Physical activity Advanced directives List of other physicians Hospitalizations, surgeries, and ER visits in previous 12 months Vitals Screenings to include cognitive, depression, and falls  Referrals and appointments  In addition, I have reviewed and discussed with patient certain preventive protocols, quality metrics, and best practice recommendations. A written personalized care plan for preventive services as well as general preventive health recommendations were provided to patient.     Quentin Angst, [place]   06/30/2022

## 2022-07-09 DIAGNOSIS — E1021 Type 1 diabetes mellitus with diabetic nephropathy: Secondary | ICD-10-CM | POA: Diagnosis not present

## 2022-08-09 DIAGNOSIS — E1021 Type 1 diabetes mellitus with diabetic nephropathy: Secondary | ICD-10-CM | POA: Diagnosis not present

## 2022-08-25 DIAGNOSIS — L84 Corns and callosities: Secondary | ICD-10-CM | POA: Diagnosis not present

## 2022-08-25 DIAGNOSIS — M79676 Pain in unspecified toe(s): Secondary | ICD-10-CM | POA: Diagnosis not present

## 2022-08-25 DIAGNOSIS — B351 Tinea unguium: Secondary | ICD-10-CM | POA: Diagnosis not present

## 2022-08-25 DIAGNOSIS — E1142 Type 2 diabetes mellitus with diabetic polyneuropathy: Secondary | ICD-10-CM | POA: Diagnosis not present

## 2022-08-29 ENCOUNTER — Ambulatory Visit: Payer: Medicare Other | Admitting: Internal Medicine

## 2022-09-12 ENCOUNTER — Encounter: Payer: Self-pay | Admitting: Internal Medicine

## 2022-09-12 ENCOUNTER — Ambulatory Visit (INDEPENDENT_AMBULATORY_CARE_PROVIDER_SITE_OTHER): Payer: Medicare Other | Admitting: Internal Medicine

## 2022-09-12 VITALS — BP 127/68 | HR 82 | Ht 68.0 in | Wt 243.4 lb

## 2022-09-12 DIAGNOSIS — Z1211 Encounter for screening for malignant neoplasm of colon: Secondary | ICD-10-CM | POA: Diagnosis not present

## 2022-09-12 DIAGNOSIS — F17210 Nicotine dependence, cigarettes, uncomplicated: Secondary | ICD-10-CM

## 2022-09-12 DIAGNOSIS — I10 Essential (primary) hypertension: Secondary | ICD-10-CM | POA: Diagnosis not present

## 2022-09-12 DIAGNOSIS — Z23 Encounter for immunization: Secondary | ICD-10-CM | POA: Diagnosis not present

## 2022-09-12 DIAGNOSIS — Z794 Long term (current) use of insulin: Secondary | ICD-10-CM

## 2022-09-12 DIAGNOSIS — B351 Tinea unguium: Secondary | ICD-10-CM

## 2022-09-12 DIAGNOSIS — Z720 Tobacco use: Secondary | ICD-10-CM

## 2022-09-12 DIAGNOSIS — E782 Mixed hyperlipidemia: Secondary | ICD-10-CM | POA: Diagnosis not present

## 2022-09-12 DIAGNOSIS — E1140 Type 2 diabetes mellitus with diabetic neuropathy, unspecified: Secondary | ICD-10-CM

## 2022-09-12 DIAGNOSIS — E0840 Diabetes mellitus due to underlying condition with diabetic neuropathy, unspecified: Secondary | ICD-10-CM | POA: Diagnosis not present

## 2022-09-12 DIAGNOSIS — Z59819 Housing instability, housed unspecified: Secondary | ICD-10-CM | POA: Diagnosis not present

## 2022-09-12 MED ORDER — DEXCOM G7 RECEIVER DEVI: 0 refills | Status: DC

## 2022-09-12 MED ORDER — DEXCOM G7 SENSOR MISC: 5 refills | Status: DC

## 2022-09-12 MED ORDER — TERBINAFINE HCL 250 MG PO TABS: 250.0000 mg | ORAL_TABLET | Freq: Every day | ORAL | 0 refills | Status: DC

## 2022-09-12 NOTE — Assessment & Plan Note (Signed)
BP Readings from Last 1 Encounters:  09/12/22 127/68   Well-controlled with Amlodipine Counseled for compliance with the medications Advised DASH diet and moderate exercise/walking, at least 150 mins/week

## 2022-09-12 NOTE — Progress Notes (Addendum)
Established Patient Office Visit  Subjective:  Patient ID: Kevin [name], male    DOB: 1955/11/22  Age: 66 y.o. MRN: [id number]  CC:  Chief Complaint  Patient presents with   Follow-up    HPI Kevin [name] is a 66 y.o. male with past medical history of DM with neuropathy, DDD of cervical spine and tobacco abuse who presents for f/u of his chronic medical conditions.  Type II DM with neuropathy: He takes metformin, [place] and Trulicity for DM. His last HbA1c was 6.8. He has had labile blood glucose with hypoglycemic episodes in the past, up to 50s.  He does not check blood glucose regularly.  He takes gabapentin and duloxetine for diabetic neuropathy. He still c/o neuropathic pain in feet and takes Norco PRN for severe pain.  HTN: BP is well-controlled. Takes medications regularly. Patient denies headache, dizziness, chest pain, dyspnea or palpitations.    Past Medical History:  Diagnosis Date   Arthritis    Chronic knee pain    Depression    Diabetes mellitus ([place])    Hyperlipidemia    Hypertension    Leg edema    Obesity     Past Surgical History:  Procedure Laterality Date   none      Family History  Problem Relation Age of Onset   Heart disease Unknown    Arthritis Unknown    Diabetes Unknown    Cancer Mother        deceased    Social History   Socioeconomic History   Marital status: Single    Spouse name: Not on file   Number of children: 0   Years of education: Not on file   Highest education level: Not on file  Occupational History   Not on file  Tobacco Use   Smoking status: Every Day    Packs/day: 0.20    Types: Cigarettes   Smokeless tobacco: Never  Substance and Sexual Activity   Alcohol use: No   Drug use: No   Sexual activity: Not Currently  Other Topics Concern   Not on file  Social History Narrative   Patient's POA is his sister, [name] [phone].           Social Determinants of Health   Financial Resource  Strain: Low Risk  (06/30/2022)   Overall Financial Resource Strain (CARDIA)    Difficulty of Paying Living Expenses: Not hard at all  Food Insecurity: No Food Insecurity (06/30/2022)   Hunger Vital Sign    Worried About Running Out of Food in the Last Year: Never true    Ran Out of Food in the Last Year: Never true  Transportation Needs: No Transportation Needs (06/30/2022)   PRAPARE - Hydrologist (Medical): No    Lack of Transportation (Non-Medical): No  Physical Activity: Inactive (06/30/2022)   Exercise Vital Sign    Days of Exercise per Week: 0 days    Minutes of Exercise per Session: 0 min  Stress: No Stress Concern Present (06/30/2022)   [place]    Feeling of Stress : Not at all  Social Connections: Socially Isolated (06/30/2022)   Social Connection and Isolation Panel [NHANES]    Frequency of Communication with Friends and Family: Twice a week    Frequency of Social Gatherings with Friends and Family: Once a week    Attends Religious Services: Never    Marine scientist or  Organizations: No    Attends Archivist Meetings: Never    Marital Status: Never married  Intimate Partner Violence: Not At Risk (06/30/2022)   Humiliation, Afraid, Rape, and Kick questionnaire    Fear of Current or Ex-Partner: No    Emotionally Abused: No    Physically Abused: No    Sexually Abused: No    Outpatient Medications Prior to Visit  Medication Sig Dispense Refill   amLODipine (NORVASC) 5 MG tablet Take 1 tablet (5 mg total) by mouth daily. 90 tablet 2   aspirin EC 81 MG tablet Take 81 mg by mouth daily.     clotrimazole (LOTRIMIN) 1 % cream Apply 1 application topically 2 (two) times daily. 30 g 1   dapagliflozin propanediol (FARXIGA) 10 MG TABS tablet Take 1 tablet (10 mg total) by mouth daily before breakfast. 90 tablet 3   Dulaglutide (TRULICITY) 4.5 YK/9.9IP SOPN Inject 4.5 mg as  directed once a week. 6 mL 3   DULoxetine (CYMBALTA) 60 MG capsule Take 1 capsule (60 mg total) by mouth daily. 90 capsule 1   folic acid (FOLVITE) 1 MG tablet Take 1 tablet (1 mg total) by mouth daily. 90 tablet 3   gabapentin (NEURONTIN) 400 MG capsule Take 1 capsule (400 mg total) by mouth 3 (three) times daily. 90 capsule 5   Glucose Blood (BLOOD GLUCOSE TEST STRIPS) STRP Use to monitor FSBS 3x daily. Dx: E11.65. (Patient taking differently: 1 each by Other route daily. E11.65.) 100 each 3   HYDROcodone-acetaminophen (NORCO/VICODIN) 5-325 MG tablet Take 1 tablet by mouth every 6 (six) hours as needed for moderate pain or severe pain. 30 tablet 0   metFORMIN (GLUCOPHAGE) 1000 MG tablet Take 1 tablet (1,000 mg total) by mouth 2 (two) times daily with a meal. 180 tablet 2   Misc. Devices MISC Lift chair 1 each 0   nitroGLYCERIN (NITROSTAT) 0.4 MG SL tablet DISSOLVE 1 TABLET UNDER TONGUE EVERY 5 MINUTES UP TO 15 MIN FOR CHEST PAIN. IF NO RELIEF CALL 911. 25 tablet 0   potassium chloride (K-DUR) 10 MEQ tablet Take 1 tablet (10 mEq total) by mouth 2 (two) times daily. Take with fluid pill 180 tablet 3   rosuvastatin (CRESTOR) 10 MG tablet Take 1 tablet (10 mg total) by mouth daily. 90 tablet 1   triamcinolone cream (KENALOG) 0.1 % Apply 1 Application topically 2 (two) times daily. 30 g 0   Continuous Blood Gluc Receiver ([place]) DEVI Please use it to check blood glucose three times before meals, at bedtime and as needed. 1 each 0   Continuous Blood Gluc Sensor (DEXCOM G7 SENSOR) MISC Please use it to check blood glucose three times before meals, at bedtime and as needed. 3 each 5   No facility-administered medications prior to visit.    Allergies  Allergen Reactions   Lisinopril Cough    ROS Review of Systems  Constitutional:  Negative for chills and fever.  HENT:  Negative for congestion and sore throat.   Eyes:  Negative for pain and discharge.  Respiratory:  Negative for  cough and shortness of breath.   Cardiovascular:  Negative for chest pain and palpitations.  Gastrointestinal:  Negative for constipation, diarrhea, nausea and vomiting.  Endocrine: Negative for polydipsia and polyuria.  Genitourinary:  Negative for dysuria and hematuria.  Musculoskeletal:  Positive for arthralgias, back pain and gait problem. Negative for neck pain and neck stiffness.  Skin:  Positive for rash.  Neurological:  Positive  for numbness. Negative for dizziness, weakness and headaches.  Psychiatric/Behavioral:  Negative for agitation and behavioral problems.       Objective:    Physical Exam Vitals reviewed.  Constitutional:      General: He is not in acute distress.    Appearance: He is not diaphoretic.     Comments: Has a cane for walking support  HENT:     Head: Normocephalic and atraumatic.     Nose: Nose normal.     Mouth/Throat:     Mouth: Mucous membranes are moist.  Eyes:     General: No scleral icterus.    Extraocular Movements: Extraocular movements intact.  Cardiovascular:     Rate and Rhythm: Normal rate and regular rhythm.     Pulses: Normal pulses.     Heart sounds: Normal heart sounds. No murmur heard. Pulmonary:     Breath sounds: Normal breath sounds. No wheezing or rales.  Abdominal:     Palpations: Abdomen is soft.     Tenderness: There is no abdominal tenderness.  Musculoskeletal:     Cervical back: Neck supple. No tenderness.     Right lower leg: No edema.     Left lower leg: No edema.  Feet:     Right foot:     Toenail Condition: Right toenails are abnormally thick. Fungal disease present.    Left foot:     Toenail Condition: Left toenails are abnormally thick.  Skin:    General: Skin is warm.     Findings: Rash (Maculopapular rash over erythematous base - over b/l upper extremity) present.  Neurological:     General: No focal deficit present.     Mental Status: He is alert and oriented to person, place, and time.     Motor:  Weakness (b/l LE, 4/5) present.     Gait: Gait abnormal.     Comments: Rapid and slurred speech, appears to be at baseline  Psychiatric:        Mood and Affect: Mood normal.        Behavior: Behavior normal.     BP 127/68 (BP Location: Right Arm, Patient Position: Sitting, Cuff Size: Large)   Pulse 82   Ht '5\' 8"'  (1.727 m)   Wt 243 lb 6.4 oz (110.4 kg)   SpO2 96%   BMI 37.01 kg/m  Wt Readings from Last 3 Encounters:  09/12/22 243 lb 6.4 oz (110.4 kg)  04/25/22 243 lb (110.2 kg)  02/22/22 244 lb 3.2 oz (110.8 kg)    Lab Results  Component Value Date   TSH 0.919 04/25/2022   Lab Results  Component Value Date   WBC 9.2 04/25/2022   HGB 17.0 04/25/2022   HCT 49.0 04/25/2022   MCV 93 04/25/2022   PLT 191 04/25/2022   Lab Results  Component Value Date   NA 140 04/25/2022   K 4.8 04/25/2022   CO2 24 04/25/2022   GLUCOSE 172 (H) 04/25/2022   BUN 11 04/25/2022   CREATININE 0.83 04/25/2022   BILITOT 0.5 04/25/2022   ALKPHOS 82 04/25/2022   AST 21 04/25/2022   ALT 16 04/25/2022   PROT 7.1 04/25/2022   ALBUMIN 4.3 04/25/2022   CALCIUM 9.4 04/25/2022   ANIONGAP 12 01/25/2018   EGFR 98 04/25/2022   GFR 94.60 08/27/2021   Lab Results  Component Value Date   CHOL 110 04/25/2022   Lab Results  Component Value Date   HDL 41 04/25/2022   Lab Results  Component Value  Date   LDLCALC 52 04/25/2022   Lab Results  Component Value Date   TRIG 89 04/25/2022   Lab Results  Component Value Date   CHOLHDL 2.7 04/25/2022   Lab Results  Component Value Date   HGBA1C 6.8 (H) 04/25/2022      Assessment & Plan:   Problem List Items Addressed This Visit       Cardiovascular and Mediastinum   Essential hypertension, benign - Primary    BP Readings from Last 1 Encounters:  09/12/22 127/68  Well-controlled with Amlodipine Counseled for compliance with the medications Advised DASH diet and moderate exercise/walking, at least 150 mins/week      Relevant Orders    AMB Referral to Chronic Care Management Services     Endocrine   Diabetes mellitus with neurological manifestation ([place])    Lab Results  Component Value Date   HGBA1C 6.8 (H) 04/25/2022  Well-controlled overall On Trulicity, Farxiga and Metformin Has episodes of hypoglycemia, would benefit from CGM -  prescribed Dexcom G7 Advised to follow diabetic diet On statin Diabetic foot exam: Today Diabetic eye exam: Advised to follow up with Ophthalmology for diabetic eye exam      Relevant Medications   Continuous Blood Gluc Receiver (DEXCOM G7 RECEIVER) DEVI   Continuous Blood Gluc Sensor ([place]) MISC   Other Relevant Orders   Urine Microalbumin w/creat. ratio   AMB Referral to Chronic Care Management Services     Musculoskeletal and Integument   Onychomycosis    Started terbinafine      Relevant Medications   terbinafine (LAMISIL) 250 MG tablet     Other   Hyperlipidemia    On Crestor Check lipid profile      Relevant Orders   AMB Referral to Chronic Care Management Services   Tobacco abuse    Smokes about 2-3 cigarettes per day  Asked about quitting: confirms that he currently smokes cigarettes Advise to quit smoking: Educated about QUITTING to reduce the risk of cancer, cardio and cerebrovascular disease. Assess willingness: Unwilling to quit at this time, but is working on cutting back. Assist with counseling and pharmacotherapy: Counseled for 5 minutes and literature provided. Arrange for follow up: follow up in 3 months and continue to offer help.      Other Visit Diagnoses     Colon cancer screening       Relevant Orders   Cologuard   Housing instability       Relevant Orders   AMB Referral to Chronic Care Management Services   Need for immunization against influenza       Relevant Orders   Flu Vaccine QUAD High Dose(Fluad) (Completed)   Immunization due       Relevant Orders   Pneumococcal conjugate vaccine 20-valent (Completed)        Meds ordered this encounter  Medications   Continuous Blood Gluc Receiver ([place]) DEVI    Sig: Please use it to check blood glucose three times before meals, at bedtime and as needed.    Dispense:  1 each    Refill:  0   Continuous Blood Gluc Sensor (DEXCOM G7 SENSOR) MISC    Sig: Please use it to check blood glucose three times before meals, at bedtime and as needed.    Dispense:  3 each    Refill:  5   terbinafine (LAMISIL) 250 MG tablet    Sig: Take 1 tablet (250 mg total) by mouth daily.    Dispense:  84 tablet    Refill:  0    Follow-up: Return in about 3 months (around 12/12/2022) for DM and HTN.    Lindell Spar, MD

## 2022-09-12 NOTE — Assessment & Plan Note (Signed)
Started terbinafine

## 2022-09-12 NOTE — Assessment & Plan Note (Signed)
Smokes about 2-3 cigarettes per day  Asked about quitting: confirms that he currently smokes cigarettes Advise to quit smoking: Educated about QUITTING to reduce the risk of cancer, cardio and cerebrovascular disease. Assess willingness: Unwilling to quit at this time, but is working on cutting back. Assist with counseling and pharmacotherapy: Counseled for 5 minutes and literature provided. Arrange for follow up: follow up in 3 months and continue to offer help.

## 2022-09-12 NOTE — Patient Instructions (Signed)
Please start taking Terbinafine as prescribed for toenail infection.  Please continue taking other medications as prescribed.  Please continue to follow low carb diet and ambulate as tolerated.  Please try to cut down -> quit smoking.

## 2022-09-12 NOTE — Assessment & Plan Note (Addendum)
Lab Results  Component Value Date   HGBA1C 6.8 (H) 04/25/2022   Well-controlled overall On Trulicity, Farxiga and Metformin Has episodes of hypoglycemia, would benefit from CGM -  prescribed Dexcom G7 Advised to follow diabetic diet On statin Diabetic foot exam: Today Diabetic eye exam: Advised to follow up with Ophthalmology for diabetic eye exam

## 2022-09-12 NOTE — Assessment & Plan Note (Signed)
On Crestor Check lipid profile 

## 2022-09-16 ENCOUNTER — Encounter: Payer: Self-pay | Admitting: *Deleted

## 2022-09-16 ENCOUNTER — Ambulatory Visit (INDEPENDENT_AMBULATORY_CARE_PROVIDER_SITE_OTHER): Payer: Medicare Other | Admitting: *Deleted

## 2022-09-16 ENCOUNTER — Ambulatory Visit: Payer: Self-pay

## 2022-09-16 DIAGNOSIS — E1140 Type 2 diabetes mellitus with diabetic neuropathy, unspecified: Secondary | ICD-10-CM

## 2022-09-16 DIAGNOSIS — I10 Essential (primary) hypertension: Secondary | ICD-10-CM

## 2022-09-16 NOTE — Chronic Care Management (AMB) (Signed)
Chronic Care Management   CCM RN Visit Note  09/16/2022 Name: Kevin [name] MRN: [id number] DOB: 04-Sep-1956  Subjective: Kevin [name] is a 66 y.o. year old male who is a primary care patient of Lindell Spar, MD. The patient was referred to the Chronic Care Management team for assistance with care management needs subsequent to provider initiation of CCM services and plan of care.    Today's Visit:  Engaged with patient by telephone for initial visit.     SDOH Interventions Today    Flowsheet Row Most Recent Value  SDOH Interventions   Food Insecurity Interventions Intervention Not Indicated  Housing Interventions AMB Referral  [LCSW referral completed high priority]  Transportation Interventions Intervention Not Indicated  [uses [facility] and sister assists]  Utilities Interventions Intervention Not Indicated  Financial Strain Interventions Intervention Not Indicated  Physical Activity Interventions Intervention Not Indicated  Stress Interventions Intervention Not Indicated  Social Connections Interventions Intervention Not Indicated         Goals Addressed             This Visit's Progress    CCM (DIABETES) EXPECTED OUTCOME:  MONITOR, SELF-MANAGE AND REDUCE SYMPTOMS OF DIABETES       Current Barriers:  Knowledge Deficits related to Diabetes management Chronic Disease Management support and education needs related to Diabetes, diet No Advanced Directives in place- pt declines Pt reports he monitors CBG with Dexcom CGM, reports fasting ranges are low 100's with today's reading 102,  random readings 120 range Pt states he does not do much exercise, tries to walk some if he can  Planned Interventions: Provided education to patient about basic DM disease process; Reviewed medications with patient and discussed importance of medication adherence;        Reviewed prescribed diet with patient carbohydrate modified; Counseled on importance of regular laboratory  monitoring as prescribed;        Discussed plans with patient for ongoing care management follow up and provided patient with direct contact information for care management team;      Provided patient with written educational materials related to hypo and hyperglycemia and importance of correct treatment;       Advised patient, providing education and rationale, to check cbg twice daily and record        call provider for findings outside established parameters;       Referral made to social work team for assistance with housing instability, eviction;      Review of patient status, including review of consultants reports, relevant laboratory and other test results, and medications completed;       Screening for signs and symptoms of depression related to chronic disease state;        Assessed social determinant of health barriers;         Symptom Management: Take medications as prescribed   Attend all scheduled provider appointments Call pharmacy for medication refills 3-7 days in advance of running out of medications Attend church or other social activities Perform all self care activities independently  Call provider office for new concerns or questions  check blood sugar at prescribed times: twice daily check feet daily for cuts, sores or redness enter blood sugar readings and medication or insulin into daily log take the blood sugar log to all doctor visits take the blood sugar meter to all doctor visits trim toenails straight across fill half of plate with vegetables limit fast food meals to no more than 1 per week manage  portion size read food labels for fat, fiber, carbohydrates and portion size keep feet up while sitting wash and dry feet carefully every day Education mailed - hypoglycemia  Follow Up Plan: Telephone follow up appointment with care management team member scheduled for:  09/23/22 at 9 am       CCM (HYPERTENSION) EXPECTED OUTCOME: MONITOR, SELF-MANAGE AND  REDUCE SYMPTOMS OF HYPERTENSION       Current Barriers:  Knowledge Deficits related to Hypertension management Care Coordination needs related to housing instability, pt is being evicted Monday 09/19/22 and has nowhere to live in a patient with Hypertension Chronic Disease Management support and education needs related to Hypertension No Advanced Directives in place- pt declines Patient reports he checks blood pressure daily and readings "are fine" Patient reports he lives with his brother in mobile home they rent, pt was notified 08/30/22 that he would need to be moved out by 09/19/22 due to new landlord.  Pt states he checked with several apartment complexes but does not know how to use computer or internet and cannot follow through, pt states he has phone numbers for [employer] and [employer] but has not called, pt states his belongings will be set out in the street on 09/19/22. Pt agreeable for LCSW to call him today.  Planned Interventions: Reviewed prescribed diet low sodium Reviewed medications with patient and discussed importance of compliance;  Counseled on the importance of exercise goals with target of 150 minutes per week Discussed plans with patient for ongoing care management follow up and provided patient with direct contact information for care management team; Advised patient, providing education and rationale, to monitor blood pressure daily and record, calling PCP for findings outside established parameters;  Provided education on prescribed diet low sodium;  Discussed complications of poorly controlled blood pressure such as heart disease, stroke, circulatory complications, vision complications, kidney impairment, sexual dysfunction;  Screening for signs and symptoms of depression related to chronic disease state;  Assessed social determinant of health barriers;  Referral placed for social worker to assist with finding housing and resources for homeless shelters RN care  manager spoke with social worker Daneen Schick and gave report on patient's situation, she will be calling pt today Consulting civil engineer reviewed numbers for [employer], [employer] and [employer]  Symptom Management: Take medications as prescribed   Attend all scheduled provider appointments Call pharmacy for medication refills 3-7 days in advance of running out of medications Attend church or other social activities Perform all self care activities independently  Call provider office for new concerns or questions  check blood pressure daily choose a place to take my blood pressure (home, clinic or office, retail store) write blood pressure results in a log or diary learn about high blood pressure keep a blood pressure log take blood pressure log to all doctor appointments keep all doctor appointments take medications for blood pressure exactly as prescribed begin an exercise program report new symptoms to your doctor eat more whole grains, fruits and vegetables, lean meats and healthy fats Education mailed - low sodium diet Social worker will be calling you today to assist with housing, homeless shelters, resources  Follow Up Plan: Telephone follow up appointment with care management team member scheduled for:  09/23/22 at 9 am          Plan:Telephone follow up appointment with care management team member scheduled for:  09/23/22 at 9 am  Jacqlyn Larsen [facility], BSN RN Case Manager [facility]  [phone]          

## 2022-09-16 NOTE — Patient Instructions (Signed)
Please call the care guide team at [phone] if you need to cancel or reschedule your appointment.   If you are experiencing a Mental Health or [place] or need someone to talk to, please call the Suicide and Crisis Lifeline: 988 call the [place] National Suicide Prevention Lifeline: 5044592306 or TTY: 670-354-1618 TTY 646-474-0335) to talk to a trained counselor call 1-800-273-TALK (toll free, 24 hour hotline) go to [facility] [place] [phone]) call the [place]: [phone] call 911   Following is a copy of your full provider care plan:   Goals Addressed             This Visit's Progress    CCM (DIABETES) EXPECTED OUTCOME:  MONITOR, SELF-MANAGE AND REDUCE SYMPTOMS OF DIABETES       Current Barriers:  Knowledge Deficits related to Diabetes management Chronic Disease Management support and education needs related to Diabetes, diet No Advanced Directives in place- pt declines Pt reports he monitors CBG with Dexcom CGM, reports fasting ranges are low 100's with today's reading 102,  random readings 120 range Pt states he does not do much exercise, tries to walk some if he can  Planned Interventions: Provided education to patient about basic DM disease process; Reviewed medications with patient and discussed importance of medication adherence;        Reviewed prescribed diet with patient carbohydrate modified; Counseled on importance of regular laboratory monitoring as prescribed;        Discussed plans with patient for ongoing care management follow up and provided patient with direct contact information for care management team;      Provided patient with written educational materials related to hypo and hyperglycemia and importance of correct treatment;       Advised patient, providing education and rationale, to check cbg twice daily and record        call provider for findings  outside established parameters;       Referral made to social work team for assistance with housing instability, eviction;      Review of patient status, including review of consultants reports, relevant laboratory and other test results, and medications completed;       Screening for signs and symptoms of depression related to chronic disease state;        Assessed social determinant of health barriers;         Symptom Management: Take medications as prescribed   Attend all scheduled provider appointments Call pharmacy for medication refills 3-7 days in advance of running out of medications Attend church or other social activities Perform all self care activities independently  Call provider office for new concerns or questions  check blood sugar at prescribed times: twice daily check feet daily for cuts, sores or redness enter blood sugar readings and medication or insulin into daily log take the blood sugar log to all doctor visits take the blood sugar meter to all doctor visits trim toenails straight across fill half of plate with vegetables limit fast food meals to no more than 1 per week manage portion size read food labels for fat, fiber, carbohydrates and portion size keep feet up while sitting wash and dry feet carefully every day Education mailed - hypoglycemia  Follow Up Plan: Telephone follow up appointment with care management team member scheduled for:  09/23/22 at 9 am       CCM (HYPERTENSION) EXPECTED OUTCOME: MONITOR, SELF-MANAGE AND REDUCE SYMPTOMS OF HYPERTENSION  Current Barriers:  Knowledge Deficits related to Hypertension management Care Coordination needs related to housing instability, pt is being evicted Monday 09/19/22 and has nowhere to live in a patient with Hypertension Chronic Disease Management support and education needs related to Hypertension No Advanced Directives in place- pt declines Patient reports he checks blood pressure daily and  readings "are fine" Patient reports he lives with his brother in mobile home they rent, pt was notified 08/30/22 that he would need to be moved out by 09/19/22 due to new landlord.  Pt states he checked with several apartment complexes but does not know how to use computer or internet and cannot follow through, pt states he has phone numbers for [employer] and [employer] but has not called, pt states his belongings will be set out in the street on 09/19/22. Pt agreeable for LCSW to call him today.  Planned Interventions: Reviewed prescribed diet low sodium Reviewed medications with patient and discussed importance of compliance;  Counseled on the importance of exercise goals with target of 150 minutes per week Discussed plans with patient for ongoing care management follow up and provided patient with direct contact information for care management team; Advised patient, providing education and rationale, to monitor blood pressure daily and record, calling PCP for findings outside established parameters;  Provided education on prescribed diet low sodium;  Discussed complications of poorly controlled blood pressure such as heart disease, stroke, circulatory complications, vision complications, kidney impairment, sexual dysfunction;  Screening for signs and symptoms of depression related to chronic disease state;  Assessed social determinant of health barriers;  Referral placed for social worker to assist with finding housing and resources for homeless shelters RN care manager spoke with social worker Daneen Schick and gave report on patient's situation, she will be calling pt today Consulting civil engineer reviewed numbers for [employer], [employer] and [employer]  Symptom Management: Take medications as prescribed   Attend all scheduled provider appointments Call pharmacy for medication refills 3-7 days in advance of running out of medications Attend church or other social activities Perform all  self care activities independently  Call provider office for new concerns or questions  check blood pressure daily choose a place to take my blood pressure (home, clinic or office, retail store) write blood pressure results in a log or diary learn about high blood pressure keep a blood pressure log take blood pressure log to all doctor appointments keep all doctor appointments take medications for blood pressure exactly as prescribed begin an exercise program report new symptoms to your doctor eat more whole grains, fruits and vegetables, lean meats and healthy fats Education mailed - low sodium diet Social worker will be calling you today to assist with housing, homeless shelters, resources  Follow Up Plan: Telephone follow up appointment with care management team member scheduled for:  09/23/22 at 9 am          The patient verbalized understanding of instructions, educational materials, and care plan provided today and agreed to receive a mailed copy of patient instructions, educational materials, and care plan.   Telephone follow up appointment with care management team member scheduled for:  09/23/22 at 9 am  Low-Sodium Eating Plan Sodium, which is an element that makes up salt, helps you maintain a healthy balance of fluids in your body. Too much sodium can increase your blood pressure and cause fluid and waste to be held in your body. Your health care provider or dietitian may recommend following  this plan if you have high blood pressure (hypertension), kidney disease, liver disease, or heart failure. Eating less sodium can help lower your blood pressure, reduce swelling, and protect your heart, liver, and kidneys. What are tips for following this plan? Reading food labels The Nutrition Facts label lists the amount of sodium in one serving of the food. If you eat more than one serving, you must multiply the listed amount of sodium by the number of servings. Choose foods with  less than 140 mg of sodium per serving. Avoid foods with 300 mg of sodium or more per serving. Shopping  Look for lower-sodium products, often labeled as "low-sodium" or "no salt added." Always check the sodium content, even if foods are labeled as "unsalted" or "no salt added." Buy fresh foods. Avoid canned foods and pre-made or frozen meals. Avoid canned, cured, or processed meats. Buy breads that have less than 80 mg of sodium per slice. Cooking  Eat more home-cooked food and less restaurant, buffet, and fast food. Avoid adding salt when cooking. Use salt-free seasonings or herbs instead of table salt or sea salt. Check with your health care provider or pharmacist before using salt substitutes. Cook with plant-based oils, such as canola, sunflower, or olive oil. Meal planning When eating at a restaurant, ask that your food be prepared with less salt or no salt, if possible. Avoid dishes labeled as brined, pickled, cured, smoked, or made with soy sauce, miso, or teriyaki sauce. Avoid foods that contain MSG (monosodium glutamate). MSG is sometimes added to [place] food, bouillon, and some canned foods. Make meals that can be grilled, baked, poached, roasted, or steamed. These are generally made with less sodium. General information Most people on this plan should limit their sodium intake to 1,500-2,000 mg (milligrams) of sodium each day. What foods should I eat? Fruits Fresh, frozen, or canned fruit. Fruit juice. Vegetables Fresh or frozen vegetables. "No salt added" canned vegetables. "No salt added" tomato sauce and paste. Low-sodium or reduced-sodium tomato and vegetable juice. Grains Low-sodium cereals, including oats, puffed wheat and rice, and shredded wheat. Low-sodium crackers. Unsalted rice. Unsalted pasta. Low-sodium bread. Whole-grain breads and whole-grain pasta. Meats and other proteins Fresh or frozen (no salt added) meat, poultry, seafood, and fish. Low-sodium canned  tuna and salmon. Unsalted nuts. Dried peas, beans, and lentils without added salt. Unsalted canned beans. Eggs. Unsalted nut butters. Dairy Milk. Soy milk. Cheese that is naturally low in sodium, such as ricotta cheese, fresh mozzarella, or Swiss cheese. Low-sodium or reduced-sodium cheese. Cream cheese. Yogurt. Seasonings and condiments Fresh and dried herbs and spices. Salt-free seasonings. Low-sodium mustard and ketchup. Sodium-free salad dressing. Sodium-free light mayonnaise. Fresh or refrigerated horseradish. Lemon juice. Vinegar. Other foods Homemade, reduced-sodium, or low-sodium soups. Unsalted popcorn and pretzels. Low-salt or salt-free chips. The items listed above may not be a complete list of foods and beverages you can eat. Contact a dietitian for more information. What foods should I avoid? Vegetables Sauerkraut, pickled vegetables, and relishes. Olives. [place] fries. Onion rings. Regular canned vegetables (not low-sodium or reduced-sodium). Regular canned tomato sauce and paste (not low-sodium or reduced-sodium). Regular tomato and vegetable juice (not low-sodium or reduced-sodium). Frozen vegetables in sauces. Grains Instant hot cereals. Bread stuffing, pancake, and biscuit mixes. Croutons. Seasoned rice or pasta mixes. Noodle soup cups. Boxed or frozen macaroni and cheese. Regular salted crackers. Self-rising flour. Meats and other proteins Meat or fish that is salted, canned, smoked, spiced, or pickled. Precooked or cured meat, such as sausages  or meat loaves. Berniece Salines. Ham. Pepperoni. Hot dogs. Corned beef. Chipped beef. Salt pork. Jerky. Pickled herring. Anchovies and sardines. Regular canned tuna. Salted nuts. Dairy Processed cheese and cheese spreads. Hard cheeses. Cheese curds. Blue cheese. Feta cheese. String cheese. Regular cottage cheese. Buttermilk. Canned milk. Fats and oils Salted butter. Regular margarine. Ghee. Bacon fat. Seasonings and condiments Onion salt, garlic  salt, seasoned salt, table salt, and sea salt. Canned and packaged gravies. Worcestershire sauce. Tartar sauce. Barbecue sauce. Teriyaki sauce. Soy sauce, including reduced-sodium. Steak sauce. Fish sauce. Oyster sauce. Cocktail sauce. Horseradish that you find on the shelf. Regular ketchup and mustard. Meat flavorings and tenderizers. Bouillon cubes. Hot sauce. Pre-made or packaged marinades. Pre-made or packaged taco seasonings. Relishes. Regular salad dressings. Salsa. Other foods Salted popcorn and pretzels. Corn chips and puffs. Potato and tortilla chips. Canned or dried soups. Pizza. Frozen entrees and pot pies. The items listed above may not be a complete list of foods and beverages you should avoid. Contact a dietitian for more information. Summary Eating less sodium can help lower your blood pressure, reduce swelling, and protect your heart, liver, and kidneys. Most people on this plan should limit their sodium intake to 1,500-2,000 mg (milligrams) of sodium each day. Canned, boxed, and frozen foods are high in sodium. Restaurant foods, fast foods, and pizza are also very high in sodium. You also get sodium by adding salt to food. Try to cook at home, eat more fresh fruits and vegetables, and eat less fast food and canned, processed, or prepared foods. This information is not intended to replace advice given to you by your health care provider. Make sure you discuss any questions you have with your health care provider. Document Revised: 11/01/2019 Document Reviewed: 08/28/2019 Elsevier Patient Education  [place]. Hypoglycemia Hypoglycemia is when the sugar (glucose) level in your blood is too low. Low blood sugar can happen to people who have diabetes and people who do not have diabetes. Low blood sugar can happen quickly, and it can be an emergency. What are the causes? This condition happens most often in people who have diabetes. It may be caused by: Diabetes medicine. Not  eating enough, or not eating often enough. Doing more physical activity. Drinking alcohol on an empty stomach. If you do not have diabetes, this condition may be caused by: A tumor in the pancreas. Not eating enough, or not eating for long periods at a time (fasting). A very bad infection or illness. Problems after having weight loss (bariatric) surgery. Kidney failure or liver failure. Certain medicines. What increases the risk? This condition is more likely to develop in people who: Have diabetes and take medicines to lower their blood sugar. Abuse alcohol. Have a very bad illness. What are the signs or symptoms? Mild Hunger. Sweating and feeling clammy. Feeling dizzy or light-headed. Being sleepy or having trouble sleeping. Feeling like you may vomit (nauseous). A fast heartbeat. A headache. Blurry vision. Mood changes, such as: Being grouchy. Feeling worried or nervous (anxious). Tingling or loss of feeling (numbness) around your mouth, lips, or tongue. Moderate Confusion and poor judgment. Behavior changes. Weakness. Uneven heartbeat. Trouble with moving (coordination). Very low Very low blood sugar (severe hypoglycemia) is a medical emergency. It can cause: Fainting. Seizures. Loss of consciousness (coma). Death. How is this treated? Treating low blood sugar Low blood sugar is often treated by eating or drinking something that has sugar in it right away. The food or drink should contain 15 grams of  a fast-acting carb (carbohydrate). Options include: 4 oz (120 mL) of fruit juice. 4 oz (120 mL) of regular soda (not diet soda). A few pieces of hard candy. Check food labels to see how many pieces to eat for 15 grams. 1 Tbsp (15 mL) of sugar or honey. 4 glucose tablets. 1 tube of glucose gel. Treating low blood sugar if you have diabetes If you can think clearly and swallow safely, follow the 15:15 rule: Take 15 grams of a fast-acting carb. Talk with your doctor  about how much you should take. Always keep a source of fast-acting carb with you, such as: Glucose tablets (take 4 tablets). A few pieces of hard candy. Check food labels to see how many pieces to eat for 15 grams. 4 oz (120 mL) of fruit juice. 4 oz (120 mL) of regular soda (not diet soda). 1 Tbsp (15 mL) of honey or sugar. 1 tube of glucose gel. Check your blood sugar 15 minutes after you take the carb. If your blood sugar is still at or below 70 mg/dL (3.9 mmol/L), take 15 grams of a carb again. If your blood sugar does not go above 70 mg/dL (3.9 mmol/L) after 3 tries, get help right away. After your blood sugar goes back to normal, eat a meal or a snack within 1 hour.  Treating very low blood sugar If your blood sugar is below 54 mg/dL (3 mmol/L), you have very low blood sugar, or severe hypoglycemia. This is an emergency. Get medical help right away. If you have very low blood sugar and you cannot eat or drink, you will need to be given a hormone called glucagon. A family member or friend should learn how to check your blood sugar and how to give you glucagon. Ask your doctor if you need to have an emergency glucagon kit at home. Very low blood sugar may also need to be treated in a hospital. Follow these instructions at home: General instructions Take over-the-counter and prescription medicines only as told by your doctor. Stay aware of your blood sugar as told by your doctor. If you drink alcohol: Limit how much you have to: 0-1 drink a day for women who are not pregnant. 0-2 drinks a day for men. Know how much alcohol is in your drink. In the [place] one drink equals one 12 oz bottle of beer (355 mL), one 5 oz glass of wine (148 mL), or one 1 oz glass of hard liquor (44 mL). Be sure to eat food when you drink alcohol. Know that your body absorbs alcohol quickly. This may lead to low blood sugar later. Be sure to keep checking your blood sugar. Keep all follow-up visits. If you  have diabetes:  Always have a fast-acting carb (15 grams) with you to treat low blood sugar. Follow your diabetes care plan as told by your doctor. Make sure you: Know the symptoms of low blood sugar. Check your blood sugar as often as told. Always check it before and after exercise. Always check your blood sugar before you drive. Take your medicines as told. Follow your meal plan. Eat on time. Do not skip meals. Share your diabetes care plan with: Your work or school. People you live with. Carry a card or wear jewelry that says you have diabetes. Where to find more information American Diabetes Association: www.diabetes.org Contact a doctor if: You have trouble keeping your blood sugar in your target range. You have low blood sugar often. Get help right away  if: You still have symptoms after you eat or drink something that contains 15 grams of fast-acting carb, and you cannot get your blood sugar above 70 mg/dL by following the 15:15 rule. Your blood sugar is below 54 mg/dL (3 mmol/L). You have a seizure. You faint. These symptoms may be an emergency. Get help right away. Call your local emergency services (911 in the [place] Do not wait to see if the symptoms will go away. Do not drive yourself to the hospital. Summary Hypoglycemia happens when the level of sugar (glucose) in your blood is too low. Low blood sugar can happen to people who have diabetes and people who do not have diabetes. Low blood sugar can happen quickly, and it can be an emergency. Make sure you know the symptoms of low blood sugar and know how to treat it. Always keep a source of sugar (fast-acting carb) with you to treat low blood sugar. This information is not intended to replace advice given to you by your health care provider. Make sure you discuss any questions you have with your health care provider. Document Revised: 08/27/2020 Document Reviewed: 08/27/2020 Elsevier Patient Education  [place].

## 2022-09-16 NOTE — Plan of Care (Signed)
Chronic Care Management Provider Comprehensive Care Plan    09/16/2022 Name: Kevin [name] MRN: [id number] DOB: 12-14-1955  Referral to Chronic Care Management (CCM) services was placed by Provider:  Ihor Dow on Date: 09/12/22.  Chronic Condition 1: HYPERTENSION Provider Assessment and Plan   Essential hypertension, benign - Primary          BP Readings from Last 1 Encounters:  09/12/22 127/68  Well-controlled with Amlodipine Counseled for compliance with the medications Advised DASH diet and moderate exercise/walking, at least 150 mins/week        Relevant Orders    AMB Referral to Chronic Care Management Services     Expected Outcome/Goals Addressed This Visit (Provider CCM goals/Provider Assessment and plan  CCM (HYPERTENSION) EXPECTED OUTCOME: MONITOR, SELF-MANAGE AND REDUCE SYMPTOMS OF HYPERTENSION  Symptom Management Condition 1: Take medications as prescribed   Attend all scheduled provider appointments Call pharmacy for medication refills 3-7 days in advance of running out of medications Attend church or other social activities Perform all self care activities independently  Call provider office for new concerns or questions  check blood pressure daily choose a place to take my blood pressure (home, clinic or office, retail store) write blood pressure results in a log or diary learn about high blood pressure keep a blood pressure log take blood pressure log to all doctor appointments keep all doctor appointments take medications for blood pressure exactly as prescribed begin an exercise program report new symptoms to your doctor eat more whole grains, fruits and vegetables, lean meats and healthy fats Education mailed - low sodium diet Social worker will be calling you today to assist with housing, homeless shelters, resources  Chronic Condition 2: DIABETES Provider Assessment and Plan  Diabetes mellitus with neurological manifestation ([place])             Lab Results  Component Value Date    HGBA1C 6.8 (H) 04/25/2022  Well-controlled overall On Trulicity, Farxiga and Metformin Has episodes of hypoglycemia, would benefit from CGM -  prescribed Dexcom G7 Advised to follow diabetic diet On statin Diabetic foot exam: Today Diabetic eye exam: Advised to follow up with Ophthalmology for diabetic eye exam        Relevant Medications    Continuous Blood Gluc Receiver (DEXCOM G7 RECEIVER) DEVI    Continuous Blood Gluc Sensor ([place]) MISC    Other Relevant Orders    Urine Microalbumin w/creat. ratio    AMB Referral to Chronic Care Management Services     Expected Outcome/Goals Addressed This Visit (Provider CCM goals/Provider Assessment and plan  CCM (DIABETES) EXPECTED OUTCOME:  MONITOR, SELF-MANAGE AND REDUCE SYMPTOMS OF DIABETES  Symptom Management Condition 2: Take medications as prescribed   Attend all scheduled provider appointments Call pharmacy for medication refills 3-7 days in advance of running out of medications Attend church or other social activities Perform all self care activities independently  Call provider office for new concerns or questions  check blood sugar at prescribed times: twice daily check feet daily for cuts, sores or redness enter blood sugar readings and medication or insulin into daily log take the blood sugar log to all doctor visits take the blood sugar meter to all doctor visits trim toenails straight across fill half of plate with vegetables limit fast food meals to no more than 1 per week manage portion size read food labels for fat, fiber, carbohydrates and portion size keep feet up while sitting wash and dry feet carefully every day  Education mailed - hypoglycemia  Problem List Patient Active Problem List   Diagnosis Date Noted   Onychomycosis 09/12/2022   Allergic contact dermatitis 04/25/2022   Encounter for general adult medical examination with abnormal findings  02/22/2022   Prostate cancer screening 02/22/2022   Subclinical hypothyroidism 10/25/2021   History of medication noncompliance 10/25/2021   Physical deconditioning 06/24/2021   Arthritis of facet joint of cervical spine 12/12/2019   Tobacco abuse 11/27/2018   Impingement syndrome of left shoulder 06/01/2017   Protrusion of cervical intervertebral disc 06/01/2017   DDD (degenerative disc disease), cervical 09/07/2015   Seasonal allergies 02/17/2014   Unspecified sleep apnea 05/15/2013   Tremor 08/07/2012   Elevated LFTs 08/07/2012   Anxiety 06/27/2012   Macrocytosis without anemia 04/19/2012   Esophageal dysphagia 03/27/2012   OA (osteoarthritis) of knee 01/18/2012   Diabetes mellitus with neurological manifestation ([place]) 12/23/2011   Neuropathy, diabetic ([place]) 12/23/2011   Class 2 severe obesity due to excess calories with serious comorbidity and body mass index (BMI) of 36.0 to 36.9 in adult [facility])    Essential hypertension, benign 12/08/2011   Hyperlipidemia 12/08/2011    Medication Management  Current Outpatient Medications:    amLODipine (NORVASC) 5 MG tablet, Take 1 tablet (5 mg total) by mouth daily., Disp: 90 tablet, Rfl: 2   aspirin EC 81 MG tablet, Take 81 mg by mouth daily., Disp: , Rfl:    clotrimazole (LOTRIMIN) 1 % cream, Apply 1 application topically 2 (two) times daily., Disp: 30 g, Rfl: 1   Continuous Blood Gluc Receiver ([place]) DEVI, Please use it to check blood glucose three times before meals, at bedtime and as needed., Disp: 1 each, Rfl: 0   Continuous Blood Gluc Sensor (DEXCOM G7 SENSOR) MISC, Please use it to check blood glucose three times before meals, at bedtime and as needed., Disp: 3 each, Rfl: 5   dapagliflozin propanediol (FARXIGA) 10 MG TABS tablet, Take 1 tablet (10 mg total) by mouth daily before breakfast., Disp: 90 tablet, Rfl: 3   Dulaglutide (TRULICITY) 4.5 ZE/0.9QZ SOPN, Inject 4.5 mg as directed once a week., Disp: 6 mL, Rfl: 3    DULoxetine (CYMBALTA) 60 MG capsule, Take 1 capsule (60 mg total) by mouth daily., Disp: 90 capsule, Rfl: 1   folic acid (FOLVITE) 1 MG tablet, Take 1 tablet (1 mg total) by mouth daily., Disp: 90 tablet, Rfl: 3   gabapentin (NEURONTIN) 400 MG capsule, Take 1 capsule (400 mg total) by mouth 3 (three) times daily., Disp: 90 capsule, Rfl: 5   Glucose Blood (BLOOD GLUCOSE TEST STRIPS) STRP, Use to monitor FSBS 3x daily. Dx: E11.65. (Patient taking differently: 1 each by Other route daily. E11.65.), Disp: 100 each, Rfl: 3   HYDROcodone-acetaminophen (NORCO/VICODIN) 5-325 MG tablet, Take 1 tablet by mouth every 6 (six) hours as needed for moderate pain or severe pain., Disp: 30 tablet, Rfl: 0   metFORMIN (GLUCOPHAGE) 1000 MG tablet, Take 1 tablet (1,000 mg total) by mouth 2 (two) times daily with a meal., Disp: 180 tablet, Rfl: 2   Misc. Devices MISC, Lift chair, Disp: 1 each, Rfl: 0   nitroGLYCERIN (NITROSTAT) 0.4 MG SL tablet, DISSOLVE 1 TABLET UNDER TONGUE EVERY 5 MINUTES UP TO 15 MIN FOR CHEST PAIN. IF NO RELIEF CALL 911., Disp: 25 tablet, Rfl: 0   potassium chloride (K-DUR) 10 MEQ tablet, Take 1 tablet (10 mEq total) by mouth 2 (two) times daily. Take with fluid pill, Disp: 180 tablet, Rfl: 3  rosuvastatin (CRESTOR) 10 MG tablet, Take 1 tablet (10 mg total) by mouth daily., Disp: 90 tablet, Rfl: 1   terbinafine (LAMISIL) 250 MG tablet, Take 1 tablet (250 mg total) by mouth daily., Disp: 84 tablet, Rfl: 0   triamcinolone cream (KENALOG) 0.1 %, Apply 1 Application topically 2 (two) times daily., Disp: 30 g, Rfl: 0  Cognitive Assessment Identity Confirmed: : Name; DOB Cognitive Status: Normal   Functional Assessment Hearing Difficulty or Deaf: no Wear Glasses or Blind: yes Vision Management: can see well w/ glasses Concentrating, Remembering or Making Decisions Difficulty (CP): no Difficulty Communicating: no Difficulty Eating/Swallowing: no Walking or Climbing Stairs Difficulty:  no Dressing/Bathing Difficulty: no Doing Errands Independently Difficulty (such as shopping) (CP): yes Errands Management: does not drive   sister provides transportation Change in Functional Status Since Onset of Current Illness/Injury: no   Caregiver Assessment  Primary Source of Support/Comfort: sibling(s) Name of Support/Comfort Primary Source: lives with brother   sister assists as needed,  provides transportation as needed People in Home: sibling(s) Name(s) of People in Home: brother Family Caregiver if Needed: none   Planned Interventions  Provided education to patient about basic DM disease process; Reviewed medications with patient and discussed importance of medication adherence;        Reviewed prescribed diet with patient carbohydrate modified; Counseled on importance of regular laboratory monitoring as prescribed;        Discussed plans with patient for ongoing care management follow up and provided patient with direct contact information for care management team;      Provided patient with written educational materials related to hypo and hyperglycemia and importance of correct treatment;       Advised patient, providing education and rationale, to check cbg twice daily and record        call provider for findings outside established parameters;       Referral made to social work team for assistance with housing instability, eviction;      Review of patient status, including review of consultants reports, relevant laboratory and other test results, and medications completed;       Screening for signs and symptoms of depression related to chronic disease state;        Assessed social determinant of health barriers;        Reviewed prescribed diet low sodium Reviewed medications with patient and discussed importance of compliance;  Counseled on the importance of exercise goals with target of 150 minutes per week Discussed plans with patient for ongoing care management  follow up and provided patient with direct contact information for care management team; Advised patient, providing education and rationale, to monitor blood pressure daily and record, calling PCP for findings outside established parameters;  Provided education on prescribed diet low sodium;  Discussed complications of poorly controlled blood pressure such as heart disease, stroke, circulatory complications, vision complications, kidney impairment, sexual dysfunction;  Screening for signs and symptoms of depression related to chronic disease state;  Assessed social determinant of health barriers;  Referral placed for social worker to assist with finding housing and resources for homeless shelters RN care manager spoke with social worker Daneen Schick and gave report on patient's situation, she will be calling pt today Consulting civil engineer reviewed numbers for [employer], [place] and coordination with outside resources, practitioners, and providers See CCM Referral  Care Plan: Printed and mailed to patient

## 2022-09-16 NOTE — Patient Outreach (Signed)
  Care Coordination   Collaboration   Visit Note   09/16/2022 Name: Kevin [name] MRN: [id number] DOB: [birth year]  Kevin [name] is a 66 y.o. year old male who sees Kevin Spar, MD for primary care. I  attempted to engage with the patient to assist with housing needs.  What matters to the patients health and wellness today?  Housing resources.    Goals Addressed             This Visit's Progress    COMPLETED: Care Coordination Activities       Care Coordination Interventions: Collaboration with RN Care Manager who advises patient will be homeless as of Monday 12/11 with no safe place to go Unsuccessful outbound attempt to the patient - voice message left Referral placed to [place] with Kevin [name] for assistance with Coordinated Entry into a shelter. Ms. Kevin [name] is [phone] Email: [email or web address] Collaboration with CSW Kevin [name] requesting follow up with patient regarding care coordination needs Collaboration with RN Care Manager to advise of interventions and plan         SDOH assessments and interventions completed:  No     Care Coordination Interventions:  Yes, provided   Follow up plan:  Requested scheduling with CSW for follow up needs/    Encounter Outcome:  Pt. Visit Completed   Daneen Schick, Arita Miss, CDP Social Worker, Certified Dementia Practitioner [facility] Care Management  Care Coordination [phone]

## 2022-09-19 ENCOUNTER — Telehealth: Payer: Self-pay | Admitting: *Deleted

## 2022-09-19 NOTE — Progress Notes (Signed)
  Care Coordination   Note   09/19/2022 Name: Kevin [name] MRN: [id number] DOB: 04-28-1956  Kevin [name] is a 66 y.o. year old male who sees Lindell Spar, MD for primary care. I reached out to Dub Amis by phone today to offer care coordination services.  [name] was given information about Care Coordination services today including:   The Care Coordination services include support from the care team which includes your Nurse Coordinator, Clinical Social Worker, or Pharmacist.  The Care Coordination team is here to help remove barriers to the health concerns and goals most important to you. Care Coordination services are voluntary, and the patient may decline or stop services at any time by request to their care team member.   Care Coordination Consent Status: Patient agreed to services and verbal consent obtained.   Follow up plan:  Telephone appointment with care coordination team member scheduled for:  09/22/22  Encounter Outcome:  Pt. Scheduled  [place]  Direct Dial: [phone]

## 2022-09-19 NOTE — Progress Notes (Signed)
Patient is scheduled for 09/22/22  [place]  Direct Dial: [phone]

## 2022-09-22 ENCOUNTER — Ambulatory Visit: Payer: Self-pay | Admitting: *Deleted

## 2022-09-22 NOTE — Patient Outreach (Signed)
  Care Coordination   09/22/2022  Name: DOCK BACCAM MRN: [id number] DOB: 10-13-1955   Care Coordination Outreach Attempts:  An unsuccessful telephone outreach was attempted today to offer the patient information about available care coordination services as a benefit of their health plan. HIPAA compliant messages left on voicemail, providing contact information for CSW, encouraging patient to return CSW's call at his earliest convenience.  Follow Up Plan:  Additional outreach attempts will be made to offer the patient care coordination information and services.   Encounter Outcome:  No Answer.   Care Coordination Interventions:  No, not indicated.    Nat Christen, BSW, MSW, LCSW  Licensed Education officer, [facility]  Mailing [place] [address] [place], [place] [address] Physical Address-[address], [place], [place] [address] Toll Free Main # [phone] Fax # [phone] Cell # [phone] [email or web address]

## 2022-09-23 ENCOUNTER — Telehealth: Payer: Self-pay | Admitting: *Deleted

## 2022-09-23 ENCOUNTER — Telehealth: Payer: Medicare Other

## 2022-09-23 NOTE — Telephone Encounter (Signed)
   CCM RN Visit Note   09/23/22 Name: Kevin [name] MRN: [id number]      DOB: 09/24/1956  Subjective: Kevin [name] is a 66 y.o. year old male who is a primary care patient of Ihor Dow MD. The patient was referred to the Chronic Care Management team for assistance with care management needs subsequent to provider initiation of CCM services and plan of care.      An unsuccessful telephone outreach was attempted today to contact the patient about Chronic Care Management needs.    Plan: Telephone outreach planned per care guide rescheduling.  Jacqlyn Larsen [facility], BSN RN Case Manager [facility] [phone]

## 2022-10-06 ENCOUNTER — Telehealth: Payer: Self-pay | Admitting: *Deleted

## 2022-10-06 NOTE — Progress Notes (Signed)
  Care Coordination Note  10/06/2022 Name: Kevin [name] MRN: [id number] DOB: 31-Mar-1956  Kevin [name] is a 66 y.o. year old male who is a primary care patient of Lindell Spar, MD and is actively engaged with the care management team. I reached out to Dub Amis by phone today to assist with re-scheduling a follow up visit with the Licensed Clinical Social Worker  Follow up plan: Unsuccessful telephone outreach attempt made. A HIPAA compliant phone message was left for the patient providing contact information and requesting a return call.   [place]  Direct Dial: [phone]

## 2022-10-09 DIAGNOSIS — E1140 Type 2 diabetes mellitus with diabetic neuropathy, unspecified: Secondary | ICD-10-CM

## 2022-10-09 DIAGNOSIS — Z794 Long term (current) use of insulin: Secondary | ICD-10-CM

## 2022-10-09 DIAGNOSIS — I10 Essential (primary) hypertension: Secondary | ICD-10-CM

## 2022-10-11 NOTE — Progress Notes (Signed)
  Care Coordination Note  10/11/2022 Name: [name] MRN: [id number] DOB: [birth year]  Kevin [name] is a 67 y.o. year old male who is a primary care patient of Lindell Spar, MD and is actively engaged with the care management team. I reached out to Dub Amis by phone today to assist with re-scheduling an initial visit with the Licensed Clinical Social Worker  Follow up plan: Unsuccessful telephone outreach attempt made. We have been unable to make contact with the patient for follow up. The care management team is available to follow up with the patient after provider conversation with the patient regarding recommendation for care management engagement and subsequent re-referral to the care management team.   [name]  Direct Dial: [phone]

## 2022-10-12 ENCOUNTER — Ambulatory Visit (INDEPENDENT_AMBULATORY_CARE_PROVIDER_SITE_OTHER): Payer: Medicaid Other | Admitting: *Deleted

## 2022-10-12 DIAGNOSIS — I10 Essential (primary) hypertension: Secondary | ICD-10-CM

## 2022-10-12 DIAGNOSIS — E1140 Type 2 diabetes mellitus with diabetic neuropathy, unspecified: Secondary | ICD-10-CM

## 2022-10-12 NOTE — Chronic Care Management (AMB) (Signed)
Chronic Care Management   CCM RN Visit Note  10/12/2022 Name: Kevin [name] MRN: [id number] DOB: [birth year]  Subjective: Kevin [name] is a 67 y.o. year old male who is a primary care patient of Lindell Spar, MD. The patient was referred to the Chronic Care Management team for assistance with care management needs subsequent to provider initiation of CCM services and plan of care.    Today's Visit:  Engaged with patient by telephone for follow up visit.        Goals Addressed             This Visit's Progress    CCM (DIABETES) EXPECTED OUTCOME:  MONITOR, SELF-MANAGE AND REDUCE SYMPTOMS OF DIABETES       Current Barriers:  Knowledge Deficits related to Diabetes management Chronic Disease Management support and education needs related to Diabetes, diet No Advanced Directives in place- pt declines Pt reports he monitors CBG with Dexcom CGM, reports fasting ranges are low 100's with today's reading 112,  random readings 101-103 range Pt states he does not do much exercise, tries to walk some if he can  Planned Interventions: Reviewed medications with patient and discussed importance of medication adherence;        Reviewed prescribed diet with patient carbohydrate modified; Counseled on importance of regular laboratory monitoring as prescribed;        Advised patient, providing education and rationale, to check cbg twice daily and record        call provider for findings outside established parameters;       Review of patient status, including review of consultants reports, relevant laboratory and other test results, and medications completed;       Reviewed CBG log with patient  Symptom Management: Take medications as prescribed   Attend all scheduled provider appointments Call pharmacy for medication refills 3-7 days in advance of running out of medications Attend church or other social activities Perform all self care activities independently  Call provider office  for new concerns or questions  check blood sugar at prescribed times: twice daily and per Dexcom  check feet daily for cuts, sores or redness enter blood sugar readings and medication or insulin into daily log take the blood sugar log to all doctor visits take the blood sugar meter to all doctor visits fill half of plate with vegetables limit fast food meals to no more than 1 per week manage portion size prepare main meal at home 3 to 5 days each week read food labels for fat, fiber, carbohydrates and portion size keep feet up while sitting wash and dry feet carefully every day Try to do some type of exercise at least 3 times per week  Follow Up Plan: Telephone follow up appointment with care management team member scheduled for:  12/21/22 at 130 pm       CCM (HYPERTENSION) EXPECTED OUTCOME: MONITOR, SELF-MANAGE AND REDUCE SYMPTOMS OF HYPERTENSION       Current Barriers:  Knowledge Deficits related to Hypertension management Care Coordination needs related to housing instability, pt found suitable housing, collaboration with LCSW in a patient with Hypertension Chronic Disease Management support and education needs related to Hypertension No Advanced Directives in place- pt declines Patient reports he was checking blood pressure daily and readings had been good, pt states when he moved he misplaced blood pressure cuff, will try to get another one, will call [facility] to see if they can assist Patient states he found suitable housing, he is  now living in a mobile home with his brother in [place]   Planned Interventions: Reviewed prescribed diet low sodium Reviewed medications with patient and discussed importance of compliance;  Counseled on the importance of exercise goals with target of 150 minutes per week Advised patient, providing education and rationale, to monitor blood pressure daily and record, calling PCP for findings outside established parameters;  Provided  education on prescribed diet low sodium;  Encouraged patient to check with insurance [employer] to obtain blood pressure cuff from [employer], pt states he is able to do this In basket sent to [employer]	[place] reporting pt states he does not have any further social work needs, found stable housing  Symptom Management: Take medications as prescribed   Attend all scheduled provider appointments Call pharmacy for medication refills 3-7 days in advance of running out of medications Attend church or other social activities Perform all self care activities independently  Call provider office for new concerns or questions  check blood pressure daily choose a place to take my blood pressure (home, clinic or office, retail store) write blood pressure results in a log or diary learn about high blood pressure keep a blood pressure log take blood pressure log to all doctor appointments keep all doctor appointments take medications for blood pressure exactly as prescribed begin an exercise program report new symptoms to your doctor eat more whole grains, fruits and vegetables, lean meats and healthy fats Follow low sodium diet- limit/ avoid fast food Read labels for sodium content  Follow Up Plan: Telephone follow up appointment with care management team member scheduled for:  12/21/22 at 130 pm          Plan:Telephone follow up appointment with care management team member scheduled for:  12/21/22 at 130 pm  Jacqlyn Larsen [facility], BSN RN Case Manager [place] [facility] [phone]

## 2022-10-12 NOTE — Patient Instructions (Signed)
Please call the care guide team at [phone] if you need to cancel or reschedule your appointment.   If you are experiencing a Mental Health or [place] or need someone to talk to, please call the Suicide and Crisis Lifeline: 988 call the [place] National Suicide Prevention Lifeline: 662-826-4358 or TTY: 703-818-4234 TTY (212)041-7136) to talk to a trained counselor call 1-800-273-TALK (toll free, 24 hour hotline) go to [facility] [address] [place] [id number]) call the [facility]: [phone] call 911   Following is a copy of the CCM Program Consent:  CCM service includes personalized support from designated clinical staff supervised by the physician, including individualized plan of care and coordination with other care providers 24/7 contact phone numbers for assistance for urgent and routine care needs. Service will only be billed when office clinical staff spend 20 minutes or more in a month to coordinate care. Only one practitioner may furnish and bill the service in a calendar month. The patient may stop CCM services at amy time (effective at the end of the month) by phone call to the office staff. The patient will be responsible for cost sharing (co-pay) or up to 20% of the service fee (after annual deductible is met)  Following is a copy of your full provider care plan:   Goals Addressed             This Visit's Progress    CCM (DIABETES) EXPECTED OUTCOME:  MONITOR, SELF-MANAGE AND REDUCE SYMPTOMS OF DIABETES       Current Barriers:  Knowledge Deficits related to Diabetes management Chronic Disease Management support and education needs related to Diabetes, diet No Advanced Directives in place- pt declines Pt reports he monitors CBG with Dexcom CGM, reports fasting ranges are low 100's with today's reading 112,  random readings 101-103 range Pt states he does not do much exercise, tries to  walk some if he can  Planned Interventions: Reviewed medications with patient and discussed importance of medication adherence;        Reviewed prescribed diet with patient carbohydrate modified; Counseled on importance of regular laboratory monitoring as prescribed;        Advised patient, providing education and rationale, to check cbg twice daily and record        call provider for findings outside established parameters;       Review of patient status, including review of consultants reports, relevant laboratory and other test results, and medications completed;       Reviewed CBG log with patient  Symptom Management: Take medications as prescribed   Attend all scheduled provider appointments Call pharmacy for medication refills 3-7 days in advance of running out of medications Attend church or other social activities Perform all self care activities independently  Call provider office for new concerns or questions  check blood sugar at prescribed times: twice daily and per Dexcom  check feet daily for cuts, sores or redness enter blood sugar readings and medication or insulin into daily log take the blood sugar log to all doctor visits take the blood sugar meter to all doctor visits fill half of plate with vegetables limit fast food meals to no more than 1 per week manage portion size prepare main meal at home 3 to 5 days each week read food labels for fat, fiber, carbohydrates and portion size keep feet up while sitting wash and dry feet carefully every day Try to do some type of exercise at  least 3 times per week  Follow Up Plan: Telephone follow up appointment with care management team member scheduled for:  12/21/22 at 130 pm       CCM (HYPERTENSION) EXPECTED OUTCOME: MONITOR, SELF-MANAGE AND REDUCE SYMPTOMS OF HYPERTENSION       Current Barriers:  Knowledge Deficits related to Hypertension management Care Coordination needs related to housing instability, pt found  suitable housing, collaboration with LCSW in a patient with Hypertension Chronic Disease Management support and education needs related to Hypertension No Advanced Directives in place- pt declines Patient reports he was checking blood pressure daily and readings had been good, pt states when he moved he misplaced blood pressure cuff, will try to get another one, will call [place] to see if they can assist Patient states he found suitable housing, he is now living in a mobile home with his brother in [place]   Planned Interventions: Reviewed prescribed diet low sodium Reviewed medications with patient and discussed importance of compliance;  Counseled on the importance of exercise goals with target of 150 minutes per week Advised patient, providing education and rationale, to monitor blood pressure daily and record, calling PCP for findings outside established parameters;  Provided education on prescribed diet low sodium;  Encouraged patient to check with insurance [employer] to obtain blood pressure cuff from [employer], pt states he is able to do this In basket sent to [employer]	[place] reporting pt states he does not have any further social work needs, found stable housing  Symptom Management: Take medications as prescribed   Attend all scheduled provider appointments Call pharmacy for medication refills 3-7 days in advance of running out of medications Attend church or other social activities Perform all self care activities independently  Call provider office for new concerns or questions  check blood pressure daily choose a place to take my blood pressure (home, clinic or office, retail store) write blood pressure results in a log or diary learn about high blood pressure keep a blood pressure log take blood pressure log to all doctor appointments keep all doctor appointments take medications for blood pressure exactly as prescribed begin an exercise  program report new symptoms to your doctor eat more whole grains, fruits and vegetables, lean meats and healthy fats Follow low sodium diet- limit/ avoid fast food Read labels for sodium content  Follow Up Plan: Telephone follow up appointment with care management team member scheduled for:  12/21/22 at 130 pm          The patient verbalized understanding of instructions, educational materials, and care plan provided today and DECLINED offer to receive copy of patient instructions, educational materials, and care plan.   Telephone follow up appointment with care management team member scheduled for:  12/21/22 at 130 pm

## 2022-10-20 ENCOUNTER — Telehealth: Payer: Self-pay | Admitting: *Deleted

## 2022-10-20 NOTE — Telephone Encounter (Signed)
   CCM RN Visit Note   10/20/2022 Name: MAISON KESTENBAUM MRN: [id number]      DOB: 06/24/1956  Subjective: SAHAN PEN is a 67 y.o. year old male who is a primary care patient of Ihor Dow MD. The patient was referred to the Chronic Care Management team for assistance with care management needs subsequent to provider initiation of CCM services and plan of care.      An unsuccessful telephone outreach was attempted today to contact the patient about Chronic Care Management needs.   Patient left RN care manager a voicemail on 10/19/22 requesting a return phone call.  RN care manager unable to reach pt today, left voicemail requesting return phone call.  Plan:Telephone follow up appointment with care management team member scheduled for:  12/21/22 at 130 pm  Jacqlyn Larsen [facility], BSN RN Case Manager [facility] [phone]

## 2022-10-21 ENCOUNTER — Telehealth: Payer: Self-pay | Admitting: *Deleted

## 2022-10-21 NOTE — Telephone Encounter (Signed)
   CCM RN Visit Note   10/21/2022 Name: Kevin [name] MRN: [id number]      DOB: [birth year]  Subjective: Kevin [name] is a 67 y.o. year old male who is a primary care patient of Ihor Dow MD. The patient was referred to the Chronic Care Management team for assistance with care management needs subsequent to provider initiation of CCM services and plan of care.      Second unsuccessful telephone outreach was attempted today to contact the patient about Chronic Care Management needs.    Plan:Telephone follow up appointment with care management team member scheduled for:  upon care guide rescheduling.  Jacqlyn Larsen [facility], BSN RN Case Manager [facility] [phone]

## 2022-10-25 ENCOUNTER — Ambulatory Visit: Payer: Medicare Other | Admitting: Internal Medicine

## 2022-11-03 DIAGNOSIS — L84 Corns and callosities: Secondary | ICD-10-CM | POA: Diagnosis not present

## 2022-11-03 DIAGNOSIS — M79676 Pain in unspecified toe(s): Secondary | ICD-10-CM | POA: Diagnosis not present

## 2022-11-03 DIAGNOSIS — B351 Tinea unguium: Secondary | ICD-10-CM | POA: Diagnosis not present

## 2022-11-03 DIAGNOSIS — E1142 Type 2 diabetes mellitus with diabetic polyneuropathy: Secondary | ICD-10-CM | POA: Diagnosis not present

## 2022-11-08 ENCOUNTER — Encounter: Payer: Self-pay | Admitting: Internal Medicine

## 2022-11-08 ENCOUNTER — Ambulatory Visit (INDEPENDENT_AMBULATORY_CARE_PROVIDER_SITE_OTHER): Payer: 59 | Admitting: Internal Medicine

## 2022-11-08 VITALS — BP 108/70 | HR 101 | Ht 68.0 in | Wt 232.8 lb

## 2022-11-08 DIAGNOSIS — F419 Anxiety disorder, unspecified: Secondary | ICD-10-CM

## 2022-11-08 DIAGNOSIS — Z794 Long term (current) use of insulin: Secondary | ICD-10-CM

## 2022-11-08 DIAGNOSIS — E1140 Type 2 diabetes mellitus with diabetic neuropathy, unspecified: Secondary | ICD-10-CM | POA: Diagnosis not present

## 2022-11-08 DIAGNOSIS — Z1211 Encounter for screening for malignant neoplasm of colon: Secondary | ICD-10-CM

## 2022-11-08 DIAGNOSIS — I10 Essential (primary) hypertension: Secondary | ICD-10-CM | POA: Diagnosis not present

## 2022-11-08 LAB — POCT GLYCOSYLATED HEMOGLOBIN (HGB A1C): HbA1c, POC (controlled diabetic range): 6.7 % (ref 0.0–7.0)

## 2022-11-08 MED ORDER — BUSPIRONE HCL 5 MG PO TABS: 5.0000 mg | ORAL_TABLET | Freq: Two times a day (BID) | ORAL | 2 refills | Status: DC

## 2022-11-08 MED ORDER — AMLODIPINE BESYLATE 5 MG PO TABS: 5.0000 mg | ORAL_TABLET | Freq: Every day | ORAL | 3 refills | Status: DC

## 2022-11-08 NOTE — Assessment & Plan Note (Signed)
BP Readings from Last 1 Encounters:  11/08/22 108/70   Well-controlled with Amlodipine Counseled for compliance with the medications Advised DASH diet and moderate exercise/walking, at least 150 mins/week

## 2022-11-08 NOTE — Assessment & Plan Note (Signed)
Lab Results  Component Value Date   HGBA1C 6.7 11/08/2022   Well-controlled overall On Trulicity, Farxiga and Metformin -compliance questionable Has had episodes of hypoglycemia, would benefit from CGM -  prescribed Dexcom G7 Advised to follow diabetic diet On statin Diabetic eye exam: Advised to follow up with Ophthalmology for diabetic eye exam

## 2022-11-08 NOTE — Assessment & Plan Note (Signed)
Uncontrolled Currently on Cymbalta for GAD and neuropathy Added BuSpar 5 mg BID He needs a safe place to live, which is a social aspect - has been followed by CCM team, gave contact info to reach out.

## 2022-11-08 NOTE — Patient Instructions (Signed)
Please start taking Buspar as prescribed.  Please start taking Amlodipine for blood pressure.  Please continue taking other medications as prescribed.  Please contact [phone] to schedule appointment with Chronic care management - Almyra Free.

## 2022-11-08 NOTE — Progress Notes (Signed)
Established Patient Office Visit  Subjective:  Patient ID: Kevin [name], male    DOB: [birth year]  Age: 67 y.o. MRN: [id number]  CC:  Chief Complaint  Patient presents with   Hypertension    Patient is following up on hypertension and wants to discuss anxiety     HPI Kevin [name] is a 67 y.o. male with past medical history of DM with neuropathy, DDD of cervical spine and tobacco abuse who presents for f/u of his HTN and GAD.  HTN: His BP is WNL today.  He reports that he has had high BP readings at home when he ran out of amlodipine.  His compliance is questionable.  He denies any headache, dizziness, chest pain, dyspnea or palpitations currently.  GAD: He has been feeling anxious lately as he had to vacate his home.  He is actively looking for a place for him and his brother.  He states that his old landlord discarded his belongings and patient has been trying to find his lost belongings.  In the process, he has also lost his Cologuard kit among other stuff.  He is currently taking Cymbalta.  Denies any SI or HI currently.  Past Medical History:  Diagnosis Date   Arthritis    Chronic knee pain    Depression    Diabetes mellitus ([place])    Hyperlipidemia    Hypertension    Leg edema    Obesity     Past Surgical History:  Procedure Laterality Date   none      Family History  Problem Relation Age of Onset   Heart disease Unknown    Arthritis Unknown    Diabetes Unknown    Cancer Mother        deceased    Social History   Socioeconomic History   Marital status: Single    Spouse name: Not on file   Number of children: 0   Years of education: Not on file   Highest education level: Not on file  Occupational History   Not on file  Tobacco Use   Smoking status: Every Day    Packs/day: 0.20    Types: Cigarettes   Smokeless tobacco: Never  Substance and Sexual Activity   Alcohol use: No   Drug use: No   Sexual activity: Not Currently  Other Topics  Concern   Not on file  Social History Narrative   Patient's POA is his sister, [name] [phone].           Social Determinants of Health   Financial Resource Strain: Low Risk  (09/16/2022)   Overall Financial Resource Strain (CARDIA)    Difficulty of Paying Living Expenses: Not hard at all  Food Insecurity: No Food Insecurity (09/16/2022)   Hunger Vital Sign    Worried About Running Out of Food in the Last Year: Never true    Ran Out of Food in the Last Year: Never true  Transportation Needs: No Transportation Needs (09/16/2022)   PRAPARE - Hydrologist (Medical): No    Lack of Transportation (Non-Medical): No  Physical Activity: Insufficiently Active (09/16/2022)   Exercise Vital Sign    Days of Exercise per Week: 2 days    Minutes of Exercise per Session: 20 min  Stress: No Stress Concern Present (09/16/2022)   [place]    Feeling of Stress : Only a little  Social Connections: Socially Isolated (09/16/2022)  Social Licensed conveyancer [NHANES]    Frequency of Communication with Friends and Family: Twice a week    Frequency of Social Gatherings with Friends and Family: Once a week    Attends Religious Services: Never    Marine scientist or Organizations: No    Attends Archivist Meetings: Never    Marital Status: Never married  Intimate Partner Violence: Not At Risk (09/16/2022)   Humiliation, Afraid, Rape, and Kick questionnaire    Fear of Current or Ex-Partner: No    Emotionally Abused: No    Physically Abused: No    Sexually Abused: No    Outpatient Medications Prior to Visit  Medication Sig Dispense Refill   aspirin EC 81 MG tablet Take 81 mg by mouth daily.     clotrimazole (LOTRIMIN) 1 % cream Apply 1 application topically 2 (two) times daily. 30 g 1   Continuous Blood Gluc Receiver ([place]) DEVI Please use it to check blood glucose  three times before meals, at bedtime and as needed. 1 each 0   Continuous Blood Gluc Sensor (DEXCOM G7 SENSOR) MISC Please use it to check blood glucose three times before meals, at bedtime and as needed. 3 each 5   dapagliflozin propanediol (FARXIGA) 10 MG TABS tablet Take 1 tablet (10 mg total) by mouth daily before breakfast. 90 tablet 3   Dulaglutide (TRULICITY) 4.5 HQ/4.6NG SOPN Inject 4.5 mg as directed once a week. 6 mL 3   DULoxetine (CYMBALTA) 60 MG capsule Take 1 capsule (60 mg total) by mouth daily. 90 capsule 1   folic acid (FOLVITE) 1 MG tablet Take 1 tablet (1 mg total) by mouth daily. 90 tablet 3   gabapentin (NEURONTIN) 400 MG capsule Take 1 capsule (400 mg total) by mouth 3 (three) times daily. 90 capsule 5   Glucose Blood (BLOOD GLUCOSE TEST STRIPS) STRP Use to monitor FSBS 3x daily. Dx: E11.65. (Patient taking differently: 1 each by Other route daily. E11.65.) 100 each 3   HYDROcodone-acetaminophen (NORCO/VICODIN) 5-325 MG tablet Take 1 tablet by mouth every 6 (six) hours as needed for moderate pain or severe pain. 30 tablet 0   metFORMIN (GLUCOPHAGE) 1000 MG tablet Take 1 tablet (1,000 mg total) by mouth 2 (two) times daily with a meal. 180 tablet 2   Misc. Devices MISC Lift chair 1 each 0   nitroGLYCERIN (NITROSTAT) 0.4 MG SL tablet DISSOLVE 1 TABLET UNDER TONGUE EVERY 5 MINUTES UP TO 15 MIN FOR CHEST PAIN. IF NO RELIEF CALL 911. 25 tablet 0   potassium chloride (K-DUR) 10 MEQ tablet Take 1 tablet (10 mEq total) by mouth 2 (two) times daily. Take with fluid pill 180 tablet 3   rosuvastatin (CRESTOR) 10 MG tablet Take 1 tablet (10 mg total) by mouth daily. 90 tablet 1   terbinafine (LAMISIL) 250 MG tablet Take 1 tablet (250 mg total) by mouth daily. 84 tablet 0   triamcinolone cream (KENALOG) 0.1 % Apply 1 Application topically 2 (two) times daily. 30 g 0   amLODipine (NORVASC) 5 MG tablet Take 1 tablet (5 mg total) by mouth daily. 90 tablet 2   No facility-administered  medications prior to visit.    Allergies  Allergen Reactions   Lisinopril Cough    ROS Review of Systems  Constitutional:  Negative for chills and fever.  HENT:  Negative for congestion and sore throat.   Eyes:  Negative for pain and discharge.  Respiratory:  Negative for cough  and shortness of breath.   Cardiovascular:  Negative for chest pain and palpitations.  Gastrointestinal:  Negative for constipation, diarrhea, nausea and vomiting.  Endocrine: Negative for polydipsia and polyuria.  Genitourinary:  Negative for dysuria and hematuria.  Musculoskeletal:  Positive for arthralgias, back pain and gait problem. Negative for neck pain and neck stiffness.  Skin:  Positive for rash.  Neurological:  Positive for numbness. Negative for dizziness, weakness and headaches.  Psychiatric/Behavioral:  Positive for dysphoric mood and sleep disturbance. Negative for agitation and behavioral problems. The patient is nervous/anxious.       Objective:    Physical Exam Vitals reviewed.  Constitutional:      General: He is not in acute distress.    Appearance: He is not diaphoretic.     Comments: Has a cane for walking support  HENT:     Head: Normocephalic and atraumatic.     Nose: Nose normal.     Mouth/Throat:     Mouth: Mucous membranes are moist.  Eyes:     General: No scleral icterus.    Extraocular Movements: Extraocular movements intact.  Cardiovascular:     Rate and Rhythm: Normal rate and regular rhythm.     Pulses: Normal pulses.     Heart sounds: Normal heart sounds. No murmur heard. Pulmonary:     Breath sounds: Normal breath sounds. No wheezing or rales.  Abdominal:     Palpations: Abdomen is soft.     Tenderness: There is no abdominal tenderness.  Musculoskeletal:     Cervical back: Neck supple. No tenderness.     Right lower leg: No edema.     Left lower leg: No edema.  Feet:     Right foot:     Toenail Condition: Right toenails are abnormally thick. Fungal  disease present.    Left foot:     Toenail Condition: Left toenails are abnormally thick.  Skin:    General: Skin is warm.     Findings: Rash (Maculopapular rash over erythematous base - over b/l upper extremity) present.  Neurological:     General: No focal deficit present.     Mental Status: He is alert and oriented to person, place, and time.     Motor: Weakness (b/l LE, 4/5) present.     Gait: Gait abnormal.     Comments: Rapid and slurred speech, appears to be at baseline  Psychiatric:        Mood and Affect: Mood normal.        Behavior: Behavior normal.     BP 108/70 (BP Location: Left Arm, Patient Position: Sitting, Cuff Size: Large)   Pulse (!) 101   Ht '5\' 8"'$  (1.727 m)   Wt 232 lb 12.8 oz (105.6 kg)   SpO2 96%   BMI 35.40 kg/m  Wt Readings from Last 3 Encounters:  11/08/22 232 lb 12.8 oz (105.6 kg)  09/12/22 243 lb 6.4 oz (110.4 kg)  04/25/22 243 lb (110.2 kg)    Lab Results  Component Value Date   TSH 0.919 04/25/2022   Lab Results  Component Value Date   WBC 9.2 04/25/2022   HGB 17.0 04/25/2022   HCT 49.0 04/25/2022   MCV 93 04/25/2022   PLT 191 04/25/2022   Lab Results  Component Value Date   NA 140 04/25/2022   K 4.8 04/25/2022   CO2 24 04/25/2022   GLUCOSE 172 (H) 04/25/2022   BUN 11 04/25/2022   CREATININE 0.83 04/25/2022   BILITOT 0.5  04/25/2022   ALKPHOS 82 04/25/2022   AST 21 04/25/2022   ALT 16 04/25/2022   PROT 7.1 04/25/2022   ALBUMIN 4.3 04/25/2022   CALCIUM 9.4 04/25/2022   ANIONGAP 12 01/25/2018   EGFR 98 04/25/2022   GFR 94.60 08/27/2021   Lab Results  Component Value Date   CHOL 110 04/25/2022   Lab Results  Component Value Date   HDL 41 04/25/2022   Lab Results  Component Value Date   LDLCALC 52 04/25/2022   Lab Results  Component Value Date   TRIG 89 04/25/2022   Lab Results  Component Value Date   CHOLHDL 2.7 04/25/2022   Lab Results  Component Value Date   HGBA1C 6.7 11/08/2022      Assessment &  Plan:   Problem List Items Addressed This Visit       Cardiovascular and Mediastinum   Essential hypertension, benign    BP Readings from Last 1 Encounters:  11/08/22 108/70  Well-controlled with Amlodipine Counseled for compliance with the medications Advised DASH diet and moderate exercise/walking, at least 150 mins/week      Relevant Medications   amLODipine (NORVASC) 5 MG tablet     Endocrine   Diabetes mellitus with neurological manifestation (HCC)    Lab Results  Component Value Date   HGBA1C 6.7 11/08/2022  Well-controlled overall On Trulicity, Farxiga and Metformin -compliance questionable Has had episodes of hypoglycemia, would benefit from CGM -  prescribed Dexcom G7 Advised to follow diabetic diet On statin Diabetic eye exam: Advised to follow up with Ophthalmology for diabetic eye exam      Relevant Orders   POCT glycosylated hemoglobin (Hb A1C) (Completed)     Other   Anxiety - Primary    Uncontrolled Currently on Cymbalta for GAD and neuropathy Added BuSpar 5 mg BID He needs a safe place to live, which is a social aspect - has been followed by CCM team, gave contact info to reach out.      Relevant Medications   busPIRone (BUSPAR) 5 MG tablet   Other Visit Diagnoses     Colon cancer screening       Relevant Orders   Ambulatory referral to Gastroenterology       Meds ordered this encounter  Medications   amLODipine (NORVASC) 5 MG tablet    Sig: Take 1 tablet (5 mg total) by mouth daily.    Dispense:  90 tablet    Refill:  3   busPIRone (BUSPAR) 5 MG tablet    Sig: Take 1 tablet (5 mg total) by mouth 2 (two) times daily.    Dispense:  60 tablet    Refill:  2    Follow-up: Return if symptoms worsen or fail to improve.    Lindell Spar, MD

## 2022-11-09 ENCOUNTER — Encounter: Payer: Self-pay | Admitting: *Deleted

## 2022-11-09 DIAGNOSIS — E1140 Type 2 diabetes mellitus with diabetic neuropathy, unspecified: Secondary | ICD-10-CM

## 2022-11-09 DIAGNOSIS — I10 Essential (primary) hypertension: Secondary | ICD-10-CM

## 2022-11-09 DIAGNOSIS — Z794 Long term (current) use of insulin: Secondary | ICD-10-CM

## 2022-11-21 ENCOUNTER — Other Ambulatory Visit: Payer: Self-pay

## 2022-11-21 ENCOUNTER — Telehealth: Payer: Self-pay | Admitting: Internal Medicine

## 2022-11-21 ENCOUNTER — Other Ambulatory Visit: Payer: Self-pay | Admitting: Internal Medicine

## 2022-11-21 DIAGNOSIS — E0843 Diabetes mellitus due to underlying condition with diabetic autonomic (poly)neuropathy: Secondary | ICD-10-CM

## 2022-11-21 DIAGNOSIS — E782 Mixed hyperlipidemia: Secondary | ICD-10-CM

## 2022-11-21 MED ORDER — ROSUVASTATIN CALCIUM 10 MG PO TABS: 10.0000 mg | ORAL_TABLET | Freq: Every day | ORAL | 1 refills | Status: DC

## 2022-11-21 MED ORDER — DULOXETINE HCL 60 MG PO CPEP: 60.0000 mg | ORAL_CAPSULE | Freq: Every day | ORAL | 1 refills | Status: DC

## 2022-11-21 NOTE — Telephone Encounter (Signed)
Refills sent

## 2022-11-21 NOTE — Telephone Encounter (Signed)
Prescription Request  11/21/2022  Is this a "Controlled Substance" medicine? No  LOV: 11/08/2022  What is the name of the medication or equipment? DULoxetine (CYMBALTA) 60 MG capsule  rosuvastatin (CRESTOR) 10 MG tablet   Have you contacted your pharmacy to request a refill? No   Which pharmacy would you like this sent to?  [place], [place] [address] Phone: [phone] Fax: [phone]    Patient notified that their request is being sent to the clinical staff for review and that they should receive a response within 2 business days.   Please advise at Mobile [phone] (mobile)

## 2022-12-12 ENCOUNTER — Ambulatory Visit: Payer: Medicare Other | Admitting: Internal Medicine

## 2022-12-21 ENCOUNTER — Ambulatory Visit (INDEPENDENT_AMBULATORY_CARE_PROVIDER_SITE_OTHER): Payer: 59 | Admitting: Internal Medicine

## 2022-12-21 ENCOUNTER — Telehealth: Payer: Medicare Other

## 2022-12-21 ENCOUNTER — Encounter: Payer: Self-pay | Admitting: Internal Medicine

## 2022-12-21 ENCOUNTER — Telehealth: Payer: Self-pay | Admitting: *Deleted

## 2022-12-21 VITALS — BP 131/75 | HR 85 | Ht 68.0 in | Wt 229.8 lb

## 2022-12-21 DIAGNOSIS — E0843 Diabetes mellitus due to underlying condition with diabetic autonomic (poly)neuropathy: Secondary | ICD-10-CM | POA: Diagnosis not present

## 2022-12-21 DIAGNOSIS — G250 Essential tremor: Secondary | ICD-10-CM

## 2022-12-21 DIAGNOSIS — Z794 Long term (current) use of insulin: Secondary | ICD-10-CM | POA: Diagnosis not present

## 2022-12-21 DIAGNOSIS — E559 Vitamin D deficiency, unspecified: Secondary | ICD-10-CM | POA: Diagnosis not present

## 2022-12-21 DIAGNOSIS — I10 Essential (primary) hypertension: Secondary | ICD-10-CM | POA: Diagnosis not present

## 2022-12-21 DIAGNOSIS — Z125 Encounter for screening for malignant neoplasm of prostate: Secondary | ICD-10-CM

## 2022-12-21 DIAGNOSIS — E782 Mixed hyperlipidemia: Secondary | ICD-10-CM

## 2022-12-21 DIAGNOSIS — E1140 Type 2 diabetes mellitus with diabetic neuropathy, unspecified: Secondary | ICD-10-CM

## 2022-12-21 DIAGNOSIS — F419 Anxiety disorder, unspecified: Secondary | ICD-10-CM

## 2022-12-21 MED ORDER — GABAPENTIN 400 MG PO CAPS: 400.0000 mg | ORAL_CAPSULE | Freq: Three times a day (TID) | ORAL | 5 refills | Status: DC

## 2022-12-21 MED ORDER — PROPRANOLOL HCL 20 MG PO TABS: 20.0000 mg | ORAL_TABLET | Freq: Two times a day (BID) | ORAL | 3 refills | Status: DC

## 2022-12-21 MED ORDER — BUSPIRONE HCL 5 MG PO TABS: 5.0000 mg | ORAL_TABLET | Freq: Two times a day (BID) | ORAL | 5 refills | Status: DC

## 2022-12-21 NOTE — Patient Instructions (Addendum)
Please start taking Gabapentin in addition to Cymbalta for foot pain.  Please start taking Propranolol for hand tremors.  Please continue taking other medications as prescribed.  Please continue to follow low carb diet and ambulate as tolerated.  Please get fasting blood tests done before the next visit.

## 2022-12-21 NOTE — Assessment & Plan Note (Addendum)
Lab Results  Component Value Date   HGBA1C 6.7 11/08/2022   Well-controlled overall On Trulicity and Farxiga -compliance questionable Has not refilled metformin recently, discontinued for now as his glycemic profile is currently stable Has had episodes of hypoglycemia, would benefit from CGM -  prescribed Dexcom G7, will check with pharmacy about its status Advised to follow diabetic diet On statin Diabetic eye exam: Advised to follow up with Ophthalmology for diabetic eye exam

## 2022-12-21 NOTE — Assessment & Plan Note (Signed)
Restart propranolol for tremors as it is affecting his functional status Continue gabapentin for diabetic neuropathy He has denied neurology referral in the past

## 2022-12-21 NOTE — Assessment & Plan Note (Signed)
Ordered PSA after discussing its limitations for prostate cancer screening, including false positive results leading to additional investigations. 

## 2022-12-21 NOTE — Assessment & Plan Note (Signed)
On Crestor Check lipid profile 

## 2022-12-21 NOTE — Telephone Encounter (Signed)
   CCM RN Visit Note   12/21/22 Name: Kevin [name] MRN: [id number]      DOB: 21-Sep-1956  Subjective: Kevin [name] is a 68 y.o. year old male who is a primary care patient of Ihor Dow MD. The patient was referred to the Chronic Care Management team for assistance with care management needs subsequent to provider initiation of CCM services and plan of care.      Third unsuccessful telephone outreach was attempted today to contact the patient about Chronic Care Management needs.The patient was referred to the case management team by the provider who initiated CCM services. The provider has been notified of our unsuccessful attempts to make or maintain contact with the patient.   Plan: Voicemail left requesting return phone call, if pt does not call back after this third attempt, case will be closed.  Jacqlyn Larsen [facility], BSN RN Case Manager [facility] [phone]

## 2022-12-21 NOTE — Assessment & Plan Note (Signed)
BP Readings from Last 1 Encounters:  12/21/22 131/75   Well-controlled with Amlodipine Counseled for compliance with the medications Advised DASH diet and moderate exercise/walking, at least 150 mins/week

## 2022-12-21 NOTE — Assessment & Plan Note (Signed)
Better controlled Currently on Cymbalta for GAD and neuropathy On BuSpar 5 mg BID now He needed a safe place to live, which is a social aspect - has been followed by CCM team - he has a new place to live now.

## 2022-12-21 NOTE — Assessment & Plan Note (Signed)
On Cymbalta Needs to take gabapentin as well Takes Norco PRN, advised patient to avoid taking Norco - refilled for now after reviewing PDMP  Difficulty getting up from chair/bed, likely due to peripheral neuropathy in addition to hip OA and DDD of lumbar spine

## 2022-12-21 NOTE — Progress Notes (Signed)
Established Patient Office Visit  Subjective:  Patient ID: Kevin [name], male    DOB: 1956-07-26  Age: 67 y.o. MRN: [id number]  CC:  Chief Complaint  Patient presents with   Diabetes    Three month follow up for diabetes and hypertension     HPI Kevin [name] is a 67 y.o. male with past medical history of DM with neuropathy, DDD of cervical spine and tobacco abuse who presents for f/u of his chronic medical conditions.  Type II DM with neuropathy: He takes [place] and Trulicity for DM.  He has not refilled his metformin for a long time.  His last HbA1c was 6.7. He has had labile blood glucose with hypoglycemic episodes in the past, up to 50s.  He does not check blood glucose regularly, but denies any recent hypoglycemia when he has checked it.  He takes duloxetine for diabetic neuropathy. He still c/o neuropathic pain in feet and takes Norco PRN for severe pain.  He has not refilled gabapentin recently as well.  HTN: BP is well-controlled. Takes medications regularly. Patient denies headache, dizziness, chest pain, dyspnea or palpitations.  He reports tremors in both hands.  He has had difficulty writing due to tremors.  Denies any shaking movements of the legs.  His tremors are mostly upon movement.  Past Medical History:  Diagnosis Date   Arthritis    Chronic knee pain    Depression    Diabetes mellitus ([place])    Hyperlipidemia    Hypertension    Leg edema    Obesity     Past Surgical History:  Procedure Laterality Date   none      Family History  Problem Relation Age of Onset   Heart disease Unknown    Arthritis Unknown    Diabetes Unknown    Cancer Mother        deceased    Social History   Socioeconomic History   Marital status: Single    Spouse name: Not on file   Number of children: 0   Years of education: Not on file   Highest education level: Not on file  Occupational History   Not on file  Tobacco Use   Smoking status: Every Day     Packs/day: 0.20    Types: Cigarettes   Smokeless tobacco: Never  Substance and Sexual Activity   Alcohol use: No   Drug use: No   Sexual activity: Not Currently  Other Topics Concern   Not on file  Social History Narrative   Patient's POA is his sister, [name] [id number].           Social Determinants of Health   Financial Resource Strain: Low Risk  (09/16/2022)   Overall Financial Resource Strain (CARDIA)    Difficulty of Paying Living Expenses: Not hard at all  Food Insecurity: No Food Insecurity (09/16/2022)   Hunger Vital Sign    Worried About Running Out of Food in the Last Year: Never true    Ran Out of Food in the Last Year: Never true  Transportation Needs: No Transportation Needs (09/16/2022)   PRAPARE - Hydrologist (Medical): No    Lack of Transportation (Non-Medical): No  Physical Activity: Insufficiently Active (09/16/2022)   Exercise Vital Sign    Days of Exercise per Week: 2 days    Minutes of Exercise per Session: 20 min  Stress: No Stress Concern Present (09/16/2022)   [place] -  Occupational Stress Questionnaire    Feeling of Stress : Only a little  Social Connections: Socially Isolated (09/16/2022)   Social Connection and Isolation Panel [NHANES]    Frequency of Communication with Friends and Family: Twice a week    Frequency of Social Gatherings with Friends and Family: Once a week    Attends Religious Services: Never    Marine scientist or Organizations: No    Attends Archivist Meetings: Never    Marital Status: Never married  Intimate Partner Violence: Not At Risk (09/16/2022)   Humiliation, Afraid, Rape, and Kick questionnaire    Fear of Current or Ex-Partner: No    Emotionally Abused: No    Physically Abused: No    Sexually Abused: No    Outpatient Medications Prior to Visit  Medication Sig Dispense Refill   amLODipine (NORVASC) 5 MG tablet Take 1 tablet (5 mg total) by  mouth daily. 90 tablet 3   aspirin EC 81 MG tablet Take 81 mg by mouth daily.     Continuous Blood Gluc Receiver ([place]) DEVI Please use it to check blood glucose three times before meals, at bedtime and as needed. 1 each 0   Continuous Blood Gluc Sensor (DEXCOM G7 SENSOR) MISC Please use it to check blood glucose three times before meals, at bedtime and as needed. 3 each 5   dapagliflozin propanediol (FARXIGA) 10 MG TABS tablet Take 1 tablet (10 mg total) by mouth daily before breakfast. 90 tablet 3   Dulaglutide (TRULICITY) 4.5 0000000 SOPN Inject 4.5 mg as directed once a week. 6 mL 3   DULoxetine (CYMBALTA) 60 MG capsule Take 1 capsule (60 mg total) by mouth daily. 90 capsule 1   Glucose Blood (BLOOD GLUCOSE TEST STRIPS) STRP Use to monitor FSBS 3x daily. Dx: E11.65. (Patient taking differently: 1 each by Other route daily. E11.65.) 100 each 3   HYDROcodone-acetaminophen (NORCO/VICODIN) 5-325 MG tablet Take 1 tablet by mouth every 6 (six) hours as needed for moderate pain or severe pain. 30 tablet 0   Misc. Devices MISC Lift chair 1 each 0   nitroGLYCERIN (NITROSTAT) 0.4 MG SL tablet DISSOLVE 1 TABLET UNDER TONGUE EVERY 5 MINUTES UP TO 15 MIN FOR CHEST PAIN. IF NO RELIEF CALL 911. 25 tablet 0   potassium chloride (K-DUR) 10 MEQ tablet Take 1 tablet (10 mEq total) by mouth 2 (two) times daily. Take with fluid pill 180 tablet 3   rosuvastatin (CRESTOR) 10 MG tablet Take 1 tablet (10 mg total) by mouth daily. 90 tablet 1   terbinafine (LAMISIL) 250 MG tablet Take 1 tablet (250 mg total) by mouth daily. 84 tablet 0   triamcinolone cream (KENALOG) 0.1 % Apply 1 Application topically 2 (two) times daily. 30 g 0   busPIRone (BUSPAR) 5 MG tablet Take 1 tablet (5 mg total) by mouth 2 (two) times daily. 60 tablet 2   clotrimazole (LOTRIMIN) 1 % cream Apply 1 application topically 2 (two) times daily. 30 g 1   folic acid (FOLVITE) 1 MG tablet Take 1 tablet (1 mg total) by mouth daily. 90  tablet 3   gabapentin (NEURONTIN) 400 MG capsule Take 1 capsule (400 mg total) by mouth 3 (three) times daily. 90 capsule 5   metFORMIN (GLUCOPHAGE) 1000 MG tablet Take 1 tablet (1,000 mg total) by mouth 2 (two) times daily with a meal. 180 tablet 2   No facility-administered medications prior to visit.    Allergies  Allergen Reactions  Lisinopril Cough    ROS Review of Systems  Constitutional:  Negative for chills and fever.  HENT:  Negative for congestion and sore throat.   Eyes:  Negative for pain and discharge.  Respiratory:  Negative for cough and shortness of breath.   Cardiovascular:  Negative for chest pain and palpitations.  Gastrointestinal:  Negative for constipation, diarrhea, nausea and vomiting.  Endocrine: Negative for polydipsia and polyuria.  Genitourinary:  Negative for dysuria and hematuria.  Musculoskeletal:  Positive for arthralgias, back pain and gait problem. Negative for neck pain and neck stiffness.  Skin:  Positive for rash.  Neurological:  Positive for numbness. Negative for dizziness, weakness and headaches.  Psychiatric/Behavioral:  Negative for agitation and behavioral problems.       Objective:    Physical Exam Vitals reviewed.  Constitutional:      General: He is not in acute distress.    Appearance: He is not diaphoretic.     Comments: Has a cane for walking support  HENT:     Head: Normocephalic and atraumatic.     Nose: Nose normal.     Mouth/Throat:     Mouth: Mucous membranes are moist.  Eyes:     General: No scleral icterus.    Extraocular Movements: Extraocular movements intact.  Cardiovascular:     Rate and Rhythm: Normal rate and regular rhythm.     Pulses: Normal pulses.     Heart sounds: Normal heart sounds. No murmur heard. Pulmonary:     Breath sounds: Normal breath sounds. No wheezing or rales.  Abdominal:     Palpations: Abdomen is soft.     Tenderness: There is no abdominal tenderness.  Musculoskeletal:      Cervical back: Neck supple. No tenderness.     Right lower leg: No edema.     Left lower leg: No edema.  Feet:     Right foot:     Toenail Condition: Right toenails are abnormally thick. Fungal disease present.    Left foot:     Toenail Condition: Left toenails are abnormally thick.  Skin:    General: Skin is warm.     Findings: Rash (Maculopapular rash over erythematous base - over b/l upper extremity) present.  Neurological:     General: No focal deficit present.     Mental Status: He is alert and oriented to person, place, and time.     Motor: Weakness (b/l LE, 4/5) present.     Gait: Gait abnormal.     Comments: Rapid and slurred speech, appears to be at baseline  Psychiatric:        Mood and Affect: Mood normal.        Behavior: Behavior normal.     BP 131/75 (BP Location: Right Arm, Patient Position: Sitting, Cuff Size: Large)   Pulse 85   Ht '5\' 8"'$  (1.727 m)   Wt 229 lb 12.8 oz (104.2 kg)   SpO2 97%   BMI 34.94 kg/m  Wt Readings from Last 3 Encounters:  12/21/22 229 lb 12.8 oz (104.2 kg)  11/08/22 232 lb 12.8 oz (105.6 kg)  09/12/22 243 lb 6.4 oz (110.4 kg)    Lab Results  Component Value Date   TSH 0.919 04/25/2022   Lab Results  Component Value Date   WBC 9.2 04/25/2022   HGB 17.0 04/25/2022   HCT 49.0 04/25/2022   MCV 93 04/25/2022   PLT 191 04/25/2022   Lab Results  Component Value Date   NA 140  04/25/2022   K 4.8 04/25/2022   CO2 24 04/25/2022   GLUCOSE 172 (H) 04/25/2022   BUN 11 04/25/2022   CREATININE 0.83 04/25/2022   BILITOT 0.5 04/25/2022   ALKPHOS 82 04/25/2022   AST 21 04/25/2022   ALT 16 04/25/2022   PROT 7.1 04/25/2022   ALBUMIN 4.3 04/25/2022   CALCIUM 9.4 04/25/2022   ANIONGAP 12 01/25/2018   EGFR 98 04/25/2022   GFR 94.60 08/27/2021   Lab Results  Component Value Date   CHOL 110 04/25/2022   Lab Results  Component Value Date   HDL 41 04/25/2022   Lab Results  Component Value Date   LDLCALC 52 04/25/2022   Lab  Results  Component Value Date   TRIG 89 04/25/2022   Lab Results  Component Value Date   CHOLHDL 2.7 04/25/2022   Lab Results  Component Value Date   HGBA1C 6.7 11/08/2022      Assessment & Plan:   Problem List Items Addressed This Visit       Cardiovascular and Mediastinum   Essential hypertension, benign    BP Readings from Last 1 Encounters:  12/21/22 131/75  Well-controlled with Amlodipine Counseled for compliance with the medications Advised DASH diet and moderate exercise/walking, at least 150 mins/week      Relevant Medications   propranolol (INDERAL) 20 MG tablet   Other Relevant Orders   CBC with Differential/Platelet     Endocrine   Diabetes mellitus with neurological manifestation ([place]) - Primary    Lab Results  Component Value Date   HGBA1C 6.7 11/08/2022  Well-controlled overall On Trulicity and Farxiga -compliance questionable Has not refilled metformin recently, discontinued for now as his glycemic profile is currently stable Has had episodes of hypoglycemia, would benefit from CGM -  prescribed Dexcom G7, will check with pharmacy about its status Advised to follow diabetic diet On statin Diabetic eye exam: Advised to follow up with Ophthalmology for diabetic eye exam      Relevant Orders   Hemoglobin A1c   CMP14+EGFR   Neuropathy, diabetic ([place])    On Cymbalta Needs to take gabapentin as well Takes Norco PRN, advised patient to avoid taking Norco - refilled for now after reviewing PDMP  Difficulty getting up from chair/bed, likely due to peripheral neuropathy in addition to hip OA and DDD of lumbar spine      Relevant Medications   gabapentin (NEURONTIN) 400 MG capsule   Other Relevant Orders   TSH   CBC with Differential/Platelet     Nervous and Auditory   Essential tremor    Restart propranolol for tremors as it is affecting his functional status Continue gabapentin for diabetic neuropathy He has denied neurology referral in the  past       Relevant Medications   propranolol (INDERAL) 20 MG tablet   Other Relevant Orders   TSH     Other   Hyperlipidemia    On Crestor Check lipid profile      Relevant Medications   propranolol (INDERAL) 20 MG tablet   Other Relevant Orders   Lipid panel   Anxiety    Better controlled Currently on Cymbalta for GAD and neuropathy On BuSpar 5 mg BID now He needed a safe place to live, which is a social aspect - has been followed by CCM team - he has a new place to live now.      Relevant Medications   busPIRone (BUSPAR) 5 MG tablet   Other Relevant Orders  TSH   CBC with Differential/Platelet   Prostate cancer screening    Ordered PSA after discussing its limitations for prostate cancer screening, including false positive results leading to additional investigations.      Relevant Orders   PSA   Other Visit Diagnoses     Vitamin D deficiency       Relevant Orders   VITAMIN D 25 Hydroxy (Vit-D Deficiency, Fractures)      Meds ordered this encounter  Medications   busPIRone (BUSPAR) 5 MG tablet    Sig: Take 1 tablet (5 mg total) by mouth 2 (two) times daily.    Dispense:  60 tablet    Refill:  5   gabapentin (NEURONTIN) 400 MG capsule    Sig: Take 1 capsule (400 mg total) by mouth 3 (three) times daily.    Dispense:  90 capsule    Refill:  5   propranolol (INDERAL) 20 MG tablet    Sig: Take 1 tablet (20 mg total) by mouth 2 (two) times daily.    Dispense:  60 tablet    Refill:  3    Follow-up: Return in about 3 months (around 03/23/2023) for Annual physical.    Lindell Spar, MD

## 2022-12-26 ENCOUNTER — Ambulatory Visit: Payer: Self-pay | Admitting: *Deleted

## 2022-12-26 DIAGNOSIS — I10 Essential (primary) hypertension: Secondary | ICD-10-CM

## 2022-12-26 DIAGNOSIS — E1140 Type 2 diabetes mellitus with diabetic neuropathy, unspecified: Secondary | ICD-10-CM

## 2022-12-26 NOTE — Chronic Care Management (AMB) (Signed)
   12/26/2022  AUDREY SUDBURY 07-12-1956 [id number]   Unable to maintain contact with patient, care plan updated and completed, case closed.  Jacqlyn Larsen [facility], BSN RN Case Manager [facility] [phone]

## 2023-02-11 ENCOUNTER — Emergency Department (HOSPITAL_COMMUNITY): Payer: 59

## 2023-02-11 ENCOUNTER — Other Ambulatory Visit: Payer: Self-pay

## 2023-02-11 ENCOUNTER — Encounter (HOSPITAL_COMMUNITY): Payer: Self-pay

## 2023-02-11 ENCOUNTER — Emergency Department (HOSPITAL_COMMUNITY)
Admission: EM | Admit: 2023-02-11 | Discharge: 2023-02-11 | Disposition: A | Discharge status: Home / Self Care | Payer: 59 | Attending: Emergency Medicine | Admitting: Emergency Medicine

## 2023-02-11 DIAGNOSIS — S199XXA Unspecified injury of neck, initial encounter: Secondary | ICD-10-CM | POA: Diagnosis not present

## 2023-02-11 DIAGNOSIS — J439 Emphysema, unspecified: Secondary | ICD-10-CM | POA: Diagnosis not present

## 2023-02-11 DIAGNOSIS — M25561 Pain in right knee: Secondary | ICD-10-CM | POA: Diagnosis not present

## 2023-02-11 DIAGNOSIS — N200 Calculus of kidney: Secondary | ICD-10-CM | POA: Insufficient documentation

## 2023-02-11 DIAGNOSIS — W010XXA Fall on same level from slipping, tripping and stumbling without subsequent striking against object, initial encounter: Secondary | ICD-10-CM | POA: Insufficient documentation

## 2023-02-11 DIAGNOSIS — M25511 Pain in right shoulder: Secondary | ICD-10-CM | POA: Diagnosis present

## 2023-02-11 DIAGNOSIS — S42001A Fracture of unspecified part of right clavicle, initial encounter for closed fracture: Secondary | ICD-10-CM | POA: Diagnosis not present

## 2023-02-11 DIAGNOSIS — Z7982 Long term (current) use of aspirin: Secondary | ICD-10-CM | POA: Diagnosis not present

## 2023-02-11 DIAGNOSIS — M47812 Spondylosis without myelopathy or radiculopathy, cervical region: Secondary | ICD-10-CM | POA: Diagnosis not present

## 2023-02-11 DIAGNOSIS — E119 Type 2 diabetes mellitus without complications: Secondary | ICD-10-CM | POA: Diagnosis not present

## 2023-02-11 DIAGNOSIS — S2249XA Multiple fractures of ribs, unspecified side, initial encounter for closed fracture: Secondary | ICD-10-CM | POA: Insufficient documentation

## 2023-02-11 DIAGNOSIS — S42021A Displaced fracture of shaft of right clavicle, initial encounter for closed fracture: Secondary | ICD-10-CM | POA: Diagnosis not present

## 2023-02-11 DIAGNOSIS — S2241XA Multiple fractures of ribs, right side, initial encounter for closed fracture: Secondary | ICD-10-CM | POA: Diagnosis not present

## 2023-02-11 DIAGNOSIS — S4991XA Unspecified injury of right shoulder and upper arm, initial encounter: Secondary | ICD-10-CM | POA: Diagnosis not present

## 2023-02-11 DIAGNOSIS — I700 Atherosclerosis of aorta: Secondary | ICD-10-CM | POA: Insufficient documentation

## 2023-02-11 DIAGNOSIS — R911 Solitary pulmonary nodule: Secondary | ICD-10-CM | POA: Insufficient documentation

## 2023-02-11 DIAGNOSIS — S0990XA Unspecified injury of head, initial encounter: Secondary | ICD-10-CM | POA: Diagnosis not present

## 2023-02-11 LAB — CBC WITH DIFFERENTIAL/PLATELET
Abs Immature Granulocytes: 0.02 10*3/uL (ref 0.00–0.07)
Basophils Absolute: 0.1 10*3/uL (ref 0.0–0.1)
Basophils Relative: 1 %
Eosinophils Absolute: 0.1 10*3/uL (ref 0.0–0.5)
Eosinophils Relative: 2 %
HCT: 46.3 % (ref 39.0–52.0)
Hemoglobin: 15.8 g/dL (ref 13.0–17.0)
Immature Granulocytes: 0 %
Lymphocytes Relative: 23 %
Lymphs Abs: 2.2 10*3/uL (ref 0.7–4.0)
MCH: 32.9 pg (ref 26.0–34.0)
MCHC: 34.1 g/dL (ref 30.0–36.0)
MCV: 96.5 fL (ref 80.0–100.0)
Monocytes Absolute: 1 10*3/uL (ref 0.1–1.0)
Monocytes Relative: 11 %
Neutro Abs: 6.1 10*3/uL (ref 1.7–7.7)
Neutrophils Relative %: 63 %
Platelets: 168 10*3/uL (ref 150–400)
RBC: 4.8 MIL/uL (ref 4.22–5.81)
RDW: 11.7 % (ref 11.5–15.5)
WBC: 9.5 10*3/uL (ref 4.0–10.5)
nRBC: 0 % (ref 0.0–0.2)

## 2023-02-11 LAB — BASIC METABOLIC PANEL
Anion gap: 5 (ref 5–15)
BUN: 12 mg/dL (ref 8–23)
CO2: 27 mmol/L (ref 22–32)
Calcium: 8.4 mg/dL — ABNORMAL LOW (ref 8.9–10.3)
Chloride: 104 mmol/L (ref 98–111)
Creatinine, Ser: 0.74 mg/dL (ref 0.61–1.24)
GFR, Estimated: >60 mL/min (ref >60)
Glucose, Bld: 93 mg/dL (ref 70–99)
Potassium: 3.7 mmol/L (ref 3.5–5.1)
Sodium: 136 mmol/L (ref 135–145)

## 2023-02-11 MED ORDER — OXYCODONE-ACETAMINOPHEN 5-325 MG PO TABS
1.0000 | ORAL_TABLET | Freq: Once | ORAL | Status: AC
  Administered 2023-02-11: 1 via ORAL
  Filled 2023-02-11: qty 1

## 2023-02-11 MED ORDER — IBUPROFEN 400 MG PO TABS
600.0000 mg | ORAL_TABLET | Freq: Once | ORAL | Status: AC
  Administered 2023-02-11: 600 mg via ORAL
  Filled 2023-02-11: qty 2

## 2023-02-11 MED ORDER — IOHEXOL 300 MG/ML  SOLN
75.0000 mL | Freq: Once | INTRAMUSCULAR | Status: AC | PRN
  Administered 2023-02-11: 75 mL via INTRAVENOUS

## 2023-02-11 MED ORDER — IBUPROFEN 600 MG PO TABS: 600.0000 mg | ORAL_TABLET | Freq: Four times a day (QID) | ORAL | 0 refills | Status: DC | PRN

## 2023-02-11 MED ORDER — OXYCODONE-ACETAMINOPHEN 5-325 MG PO TABS: 1.0000 | ORAL_TABLET | Freq: Four times a day (QID) | ORAL | 0 refills | Status: DC | PRN

## 2023-02-11 MED ORDER — LIDOCAINE 5 % EX PTCH
1.0000 | MEDICATED_PATCH | CUTANEOUS | Status: DC
  Administered 2023-02-11: 1 via TRANSDERMAL
  Filled 2023-02-11: qty 1
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
## 2023-02-11 NOTE — ED Notes (Signed)
Patient transported to X-ray 

## 2023-02-11 NOTE — Discharge Instructions (Signed)
You were seen today for injuries after a fall 2 days ago.  You have multiple broken ribs on the right side and a right clavicle fracture.  Your x-ray of the right knee did not show any acute abnormalities.  Please keep the arm in the sling for your broken clavicle and take the medication as needed for pain.  Use the incentive spirometer to help decrease your risk for pneumonia.  We discussed you are high risk for developing pneumonia given your age and multiple rib fractures.  Make sure you keep your pain under control and come back to the ER right away if you have fever, shortness of breath, cough or other worsening symptoms.  Follow-up closely with your primary care doctor.

## 2023-02-11 NOTE — ED Triage Notes (Signed)
Pt tripped over a rug and fell onto some cinder blocks yesterday. Pt c/o pain to R shoulder, R chest wall, abrasion to R arm, and pain to R knee. Denies head injury. - LOC.

## 2023-02-11 NOTE — ED Notes (Signed)
RT called to bring incentive spirometer.

## 2023-02-14 ENCOUNTER — Telehealth: Payer: Self-pay

## 2023-02-14 NOTE — Transitions of Care (Post Inpatient/ED Visit) (Signed)
02/14/2023  Name: Kevin [name] MRN: [id number] DOB: [birth year]  Today's TOC FU Call Status: Today's TOC FU Call Status:: Successful TOC FU Call Competed TOC FU Call Complete Date: 02/14/23  Red on EMMI-ED Discharge Alert Date & Reason:02/13/23 "Scheduled follow-up appt? No"   Transition Care Management Follow-up Telephone Call Date of Discharge: 02/11/23 [facility] Type of Discharge: Emergency Department Reason for ED Visit: Other: ("closed displaced fracture of right clavicle") How have you been since you were released from the hospital?: Better (pt states he continues to have a pain to affected side-rotating pain meds-using sling) Any questions or concerns?: No  Items Reviewed: Did you receive and understand the discharge instructions provided?: Yes Medications obtained,verified, and reconciled?: No Medications Not Reviewed Reasons:: Other: (pt states he gets meds delivered from pharmacy in bubble pack-declined med review over phone) Any new allergies since your discharge?: No Dietary orders reviewed?: Yes Type of Diet Ordered:: low salt/heart healthy/carb modified Do you have support at home?: Yes People in Home: alone, sibling(s) Name of Support/Comfort Primary Source: brother helping him out while he recovers  Medications Reviewed Today: Medications Reviewed Today     Reviewed by Anabel Halon, MD (Physician) on 12/21/22 at 1603  Med List Status: <None>   Medication Order Taking? Sig Documenting Provider Last Dose Status Informant  amLODipine (NORVASC) 5 MG tablet [id number]  Take 1 tablet (5 mg total) by mouth daily. Anabel Halon, MD  Active   aspirin EC 81 MG tablet [id number] No Take 81 mg by mouth daily. [provider] Taking Active Self  busPIRone (BUSPAR) 5 MG tablet [id number]  Take 1 tablet (5 mg total) by mouth 2 (two) times daily. Anabel Halon, MD  Active   Continuous Blood Gluc Receiver  Hospital G7 RECEIVER) [place]  [id number] No Please use it to check blood glucose three times before meals, at bedtime and as needed. Anabel Halon, MD Taking Active   Continuous Blood Gluc Sensor (DEXCOM G7 SENSOR) [place] [id number] No Please use it to check blood glucose three times before meals, at bedtime and as needed. Anabel Halon, MD Taking Active   dapagliflozin propanediol (FARXIGA) 10 MG TABS tablet [id number] No Take 1 tablet (10 mg total) by mouth daily before breakfast. Anabel Halon, MD Taking Active   Dulaglutide (TRULICITY) 4.5 MG/0.5ML SOPN [id number] No Inject 4.5 mg as directed once a week. Anabel Halon, MD Taking Active   DULoxetine (CYMBALTA) 60 MG capsule [id number]  Take 1 capsule (60 mg total) by mouth daily. Anabel Halon, MD  Active   gabapentin (NEURONTIN) 400 MG capsule [id number]  Take 1 capsule (400 mg total) by mouth 3 (three) times daily. Anabel Halon, MD  Active   Glucose Blood (BLOOD GLUCOSE TEST STRIPS) STRP [id number] No Use to monitor FSBS 3x daily. Dx: E11.65.  Patient taking differently: 1 each by Other route daily. E11.65.   Jeanice Lim, Velna Hatchet, MD Taking Active Self  HYDROcodone-acetaminophen (NORCO/VICODIN) 5-325 MG tablet [id number] No Take 1 tablet by mouth every 6 (six) hours as needed for moderate pain or severe pain. Anabel Halon, MD Taking Active   Misc. Devices MISC [id number] No Lift chair Anabel Halon, MD Taking Active   nitroGLYCERIN (NITROSTAT) 0.4 MG SL tablet [id number] No DISSOLVE 1 TABLET UNDER TONGUE EVERY 5 MINUTES UP TO 15 MIN FOR CHEST PAIN. IF NO RELIEF CALL 911. Anabel Halon, MD Taking Active   potassium chloride (K-DUR) 10 MEQ tablet  [id number] No Take 1 tablet (10 mEq total) by mouth 2 (two) times daily. Take with fluid pill [place], Velna Hatchet, MD Taking Active   propranolol (INDERAL) 20 MG tablet [id number] Yes Take 1 tablet (20 mg total) by mouth 2 (two) times daily. Anabel Halon, MD  Active   rosuvastatin (CRESTOR) 10 MG tablet [id number]  Take 1 tablet (10  mg total) by mouth daily. Anabel Halon, MD  Active   terbinafine (LAMISIL) 250 MG tablet [id number] No Take 1 tablet (250 mg total) by mouth daily. Anabel Halon, MD Taking Active   triamcinolone cream (KENALOG) 0.1 % [id number] No Apply 1 Application topically 2 (two) times daily. Anabel Halon, MD Taking Active             Home Care and Equipment/Supplies: Were Home Health Services Ordered?: NA Any new equipment or medical supplies ordered?: NA  Functional Questionnaire: Do you need assistance with bathing/showering or dressing?: Yes Do you need assistance with meal preparation?: Yes Do you need assistance with eating?: No Do you have difficulty maintaining continence: No Do you need assistance with getting out of bed/getting out of a chair/moving?: No Do you have difficulty managing or taking your medications?: No  Follow up appointments reviewed: PCP Follow-up appointment confirmed?: Yes Date of PCP follow-up appointment?: 02/15/23 Follow-up Provider: Dr. Barbaraann Faster Specialist [facility] Follow-up appointment confirmed?: No Reason Specialist Follow-Up Not Confirmed: Patient has Specialist Provider Number and will Call for Appointment (provided pt's brother with ortho MD contact info to assist pt with calling and making an appt) Do you need transportation to your follow-up appointment?: No (pt states he will get his neighbor to take him to appts since he is currently unable to drive his scooter to appts due to fracture) Do you understand care options if your condition(s) worsen?: Yes-patient verbalized understanding  SDOH Interventions Today    Flowsheet Row Most Recent Value  SDOH Interventions   Food Insecurity Interventions Intervention Not Indicated  Transportation Interventions Intervention Not Indicated  [pt states he has scooter and drives to appts, also uses [facility] if needed and has a neighbor that drives him]      TOC Interventions Today    Flowsheet Row Most Recent  Value  TOC Interventions   TOC Interventions Discussed/Reviewed TOC Interventions Discussed, Arranged PCP follow up less than 12 days/Care Guide scheduled, Post discharge activity limitations per provider      Interventions Today    Flowsheet Row Most Recent Value  General Interventions   General Interventions Discussed/Reviewed General Interventions Discussed, Doctor Visits  Doctor Visits Discussed/Reviewed Doctor Visits Discussed, PCP, Specialist  PCP/Specialist Visits Compliance with follow-up visit  Education Interventions   Education Provided Provided Education  Provided Verbal Education On Nutrition, When to see the doctor, Medication, Other  [pain mgmt, bowel regimen]  Nutrition Interventions   Nutrition Discussed/Reviewed Nutrition Discussed, Adding fruits and vegetables, Decreasing salt  Pharmacy Interventions   Pharmacy Dicussed/Reviewed Pharmacy Topics Discussed, Medications and their functions  [pt states he gets his meds delivered from pharmacy-[facility]]  Safety Interventions   Safety Discussed/Reviewed Safety Discussed, Fall Risk        Alessandra Grout [facility] Care Management Care Management Community Coordinator Direct Phone: [phone] Toll Free: [phone] Fax: [phone]

## 2023-02-15 ENCOUNTER — Inpatient Hospital Stay: Payer: 59 | Admitting: Internal Medicine

## 2023-02-16 DIAGNOSIS — M79676 Pain in unspecified toe(s): Secondary | ICD-10-CM | POA: Diagnosis not present

## 2023-02-16 DIAGNOSIS — B351 Tinea unguium: Secondary | ICD-10-CM | POA: Diagnosis not present

## 2023-02-16 DIAGNOSIS — E1142 Type 2 diabetes mellitus with diabetic polyneuropathy: Secondary | ICD-10-CM | POA: Diagnosis not present

## 2023-02-16 DIAGNOSIS — L84 Corns and callosities: Secondary | ICD-10-CM | POA: Diagnosis not present

## 2023-02-21 ENCOUNTER — Other Ambulatory Visit (INDEPENDENT_AMBULATORY_CARE_PROVIDER_SITE_OTHER): Payer: 59

## 2023-02-21 ENCOUNTER — Encounter: Payer: Self-pay | Admitting: Orthopedic Surgery

## 2023-02-21 ENCOUNTER — Ambulatory Visit (INDEPENDENT_AMBULATORY_CARE_PROVIDER_SITE_OTHER): Payer: 59 | Admitting: Orthopedic Surgery

## 2023-02-21 VITALS — BP 124/69 | HR 83 | Ht 68.0 in | Wt 233.0 lb

## 2023-02-21 DIAGNOSIS — S42001A Fracture of unspecified part of right clavicle, initial encounter for closed fracture: Secondary | ICD-10-CM

## 2023-02-21 NOTE — Progress Notes (Signed)
New Patient Visit  Assessment: Kevin [name] is a 67 y.o. male with the following: 1. Closed displaced fracture of right clavicle, unspecified part of clavicle, initial encounter  Plan: Kevin [name] fell, approximately 10 days ago and sustained a fracture of his right clavicle.  He has a mild deformity, and obvious pain and swelling.  Radiographs demonstrates some displacement.  No concerning features on physical exam.  Plan to treat this without surgery.  He has not tolerated the sling.  As such, it is okay for him to avoid use of the sling, but limit how much he is using his right shoulder.  Ice and medications as needed.  Follow-up in 3 weeks  Follow-up: Return in about 3 weeks (around 03/14/2023).  Subjective:  Chief Complaint  Patient presents with   Fracture    R clavicle DOI 02/11/23    History of Present Illness: Kevin [name] is a 67 y.o. male who presents for evaluation of right shoulder pain.  Approximate 10 days ago, he fell.  He had pain in the right shoulder.  Prior to this, he had fallen, and sustained multiple rib fractures.  He was seen in emergency department.  He was given a sling.  He has not tolerated the sling.  He states it makes the pain worse.  He is taking medications that have been provided for him by another physician.  No numbness or tingling.   Review of Systems: No fevers or chills No numbness or tingling No chest pain No shortness of breath No bowel or bladder dysfunction No GI distress No headaches   Medical History:  Past Medical History:  Diagnosis Date   Arthritis    Chronic knee pain    Depression    Diabetes mellitus (HCC)    Hyperlipidemia    Hypertension    Leg edema    Obesity     Past Surgical History:  Procedure Laterality Date   none      Family History  Problem Relation Age of Onset   Heart disease Unknown    Arthritis Unknown    Diabetes Unknown    Cancer Mother        deceased   Social History    Tobacco Use   Smoking status: Every Day    Packs/day: .2    Types: Cigarettes   Smokeless tobacco: Never  Vaping Use   Vaping Use: Never used  Substance Use Topics   Alcohol use: No   Drug use: No    Allergies  Allergen Reactions   Lisinopril Cough    Current Meds  Medication Sig   amLODipine (NORVASC) 5 MG tablet Take 1 tablet (5 mg total) by mouth daily.   aspirin EC 81 MG tablet Take 81 mg by mouth daily.   busPIRone (BUSPAR) 5 MG tablet Take 1 tablet (5 mg total) by mouth 2 (two) times daily.   Continuous Blood Gluc Receiver (DEXCOM G7 RECEIVER) [place] Please use it to check blood glucose three times before meals, at bedtime and as needed.   Continuous Blood Gluc Sensor (DEXCOM G7 SENSOR) [place] Please use it to check blood glucose three times before meals, at bedtime and as needed.   dapagliflozin propanediol (FARXIGA) 10 MG TABS tablet Take 1 tablet (10 mg total) by mouth daily before breakfast.   Dulaglutide (TRULICITY) 4.5 MG/0.5ML SOPN Inject 4.5 mg as directed once a week.   DULoxetine (CYMBALTA) 60 MG capsule Take 1 capsule (60 mg total) by mouth daily.  gabapentin (NEURONTIN) 400 MG capsule Take 1 capsule (400 mg total) by mouth 3 (three) times daily.   Glucose Blood (BLOOD GLUCOSE TEST STRIPS) STRP Use to monitor FSBS 3x daily. Dx: E11.65. (Patient taking differently: 1 each by Other route daily. E11.65.)   HYDROcodone-acetaminophen (NORCO/VICODIN) 5-325 MG tablet Take 1 tablet by mouth every 6 (six) hours as needed for moderate pain or severe pain.   ibuprofen (ADVIL) 600 MG tablet Take 1 tablet (600 mg total) by mouth every 6 (six) hours as needed.   Misc. Devices MISC Lift chair   nitroGLYCERIN (NITROSTAT) 0.4 MG SL tablet DISSOLVE 1 TABLET UNDER TONGUE EVERY 5 MINUTES UP TO 15 MIN FOR CHEST PAIN. IF NO RELIEF CALL 911.   oxyCODONE-acetaminophen (PERCOCET/ROXICET) 5-325 MG tablet Take 1 tablet by mouth every 6 (six) hours as needed for severe pain.   potassium  chloride (K-DUR) 10 MEQ tablet Take 1 tablet (10 mEq total) by mouth 2 (two) times daily. Take with fluid pill   propranolol (INDERAL) 20 MG tablet Take 1 tablet (20 mg total) by mouth 2 (two) times daily.   rosuvastatin (CRESTOR) 10 MG tablet Take 1 tablet (10 mg total) by mouth daily.   terbinafine (LAMISIL) 250 MG tablet Take 1 tablet (250 mg total) by mouth daily.   triamcinolone cream (KENALOG) 0.1 % Apply 1 Application topically 2 (two) times daily.    Objective: BP 124/69   Pulse 83   Ht 5\' 8"  (1.727 m)   Wt 233 lb (105.7 kg)   BMI 35.43 kg/m   Physical Exam:  General: Alert and oriented. and No acute distress. Gait: Normal gait.  Right shoulder with deformity over the clavicle.  He has bruising and swelling in this area.  This area is tender to palpation.  Axillary nerve sensation is intact.  Sensation is intact throughout the right hand.  IMAGING: I personally ordered and reviewed the following images  X-rays of the right clavicle were obtained in clinic today.  There is a displaced fracture of the right clavicle shaft.  Mild superior translation.  Some shortening is appreciated.  Glenohumeral joint is reduced.  No bony lesions.  Impression: Right displaced midshaft clavicle fracture   New Medications:  No orders of the defined types were placed in this encounter.     Oliver Barre, MD  02/21/2023 2:38 PM

## 2023-02-23 ENCOUNTER — Encounter: Payer: Self-pay | Admitting: Internal Medicine

## 2023-02-23 ENCOUNTER — Ambulatory Visit (INDEPENDENT_AMBULATORY_CARE_PROVIDER_SITE_OTHER): Payer: 59 | Admitting: Internal Medicine

## 2023-02-23 VITALS — BP 105/63 | HR 91 | Ht 68.0 in | Wt 234.0 lb

## 2023-02-23 DIAGNOSIS — S2241XD Multiple fractures of ribs, right side, subsequent encounter for fracture with routine healing: Secondary | ICD-10-CM | POA: Diagnosis not present

## 2023-02-23 DIAGNOSIS — Z794 Long term (current) use of insulin: Secondary | ICD-10-CM

## 2023-02-23 DIAGNOSIS — Z125 Encounter for screening for malignant neoplasm of prostate: Secondary | ICD-10-CM

## 2023-02-23 DIAGNOSIS — S2241XA Multiple fractures of ribs, right side, initial encounter for closed fracture: Secondary | ICD-10-CM | POA: Insufficient documentation

## 2023-02-23 DIAGNOSIS — I10 Essential (primary) hypertension: Secondary | ICD-10-CM | POA: Diagnosis not present

## 2023-02-23 DIAGNOSIS — G250 Essential tremor: Secondary | ICD-10-CM

## 2023-02-23 DIAGNOSIS — E782 Mixed hyperlipidemia: Secondary | ICD-10-CM

## 2023-02-23 DIAGNOSIS — S42021D Displaced fracture of shaft of right clavicle, subsequent encounter for fracture with routine healing: Secondary | ICD-10-CM

## 2023-02-23 DIAGNOSIS — S42021A Displaced fracture of shaft of right clavicle, initial encounter for closed fracture: Secondary | ICD-10-CM | POA: Insufficient documentation

## 2023-02-23 DIAGNOSIS — E559 Vitamin D deficiency, unspecified: Secondary | ICD-10-CM | POA: Diagnosis not present

## 2023-02-23 DIAGNOSIS — E1140 Type 2 diabetes mellitus with diabetic neuropathy, unspecified: Secondary | ICD-10-CM

## 2023-02-23 DIAGNOSIS — E0843 Diabetes mellitus due to underlying condition with diabetic autonomic (poly)neuropathy: Secondary | ICD-10-CM

## 2023-02-23 LAB — POCT GLYCOSYLATED HEMOGLOBIN (HGB A1C): HbA1c, POC (controlled diabetic range): 6.1 % (ref 0.0–7.0)

## 2023-02-23 MED ORDER — LIDOCAINE 5 % EX PTCH
1.0000 | MEDICATED_PATCH | CUTANEOUS | 0 refills | Status: AC
Start: 2023-02-23 — End: ?

## 2023-02-23 NOTE — Assessment & Plan Note (Signed)
BP Readings from Last 1 Encounters:  02/23/23 105/63   Well-controlled with Amlodipine Counseled for compliance with the medications Advised DASH diet and moderate exercise/walking, at least 150 mins/week

## 2023-02-23 NOTE — Patient Instructions (Signed)
Please apply Lidocaine patch over ribs area.  Please continue to take medications as prescribed.  Please continue to follow low carb diet and perform moderate exercise/walking at least 150 mins/week.  Please get fasting blood tests done before the next visit.

## 2023-02-23 NOTE — Assessment & Plan Note (Addendum)
On Cymbalta Needs to continue to take gabapentin as well Used to take Norco PRN, advised patient to avoid taking Norco  Difficulty getting up from chair/bed, likely due to peripheral neuropathy in addition to hip OA and DDD of lumbar spine

## 2023-02-23 NOTE — Assessment & Plan Note (Signed)
Ordered PSA after discussing its limitations for prostate cancer screening, including false positive results leading to additional investigations. 

## 2023-02-23 NOTE — Assessment & Plan Note (Addendum)
Well controlled with propranolol Continue gabapentin for diabetic neuropathy He has denied neurology referral in the past

## 2023-02-23 NOTE — Progress Notes (Signed)
Established Patient Office Visit  Subjective:  Patient ID: Kevin [name], male    DOB: 1955/11/14  Age: 67 y.o. MRN: [id number]  CC:  Chief Complaint  Patient presents with   Diabetes    Follow up     HPI Kevin [name] is a 67 y.o. male with past medical history of DM with neuropathy, DDD of cervical spine and tobacco abuse who presents for f/u of his chronic medical conditions.  Type II DM with neuropathy: He takes [place] and Trulicity for DM.  His HbA1c was 6.1 today. He has had labile blood glucose with hypoglycemic episodes in the past, up to 50s.  He does not check blood glucose regularly, but denies any recent hypoglycemia when he has checked it.  He takes duloxetine for diabetic neuropathy. He still c/o neuropathic pain in feet and takes Norco PRN for severe pain, but has noticed improvement recently with Gabapentin.  HTN: BP is well-controlled. Takes medications regularly. Patient denies headache, dizziness, chest pain, dyspnea or palpitations.  He reports improvement in tremors in both hands with propranolol.  He had difficulty writing due to tremors.  Denies any shaking movements of the legs.  His tremors are mostly upon movement.  He had a mechanical fall while trying to come out of his trailer.  He slipped due to the rug and fell on his right side.  He had closed clavicular fracture and closed ribs fractures.  He was seen at ER and was referred to orthopedic surgeon for clavicular fracture.  He was advised to let go the sling as he was having numbness of the hands due to it.  He still reports right-sided chest wall pain, worse with deep breaths.  Denies any head injury or LOC.  Past Medical History:  Diagnosis Date   Arthritis    Chronic knee pain    Depression    Diabetes mellitus (HCC)    Hyperlipidemia    Hypertension    Leg edema    Obesity     Past Surgical History:  Procedure Laterality Date   none      Family History  Problem Relation Age of  Onset   Heart disease Unknown    Arthritis Unknown    Diabetes Unknown    Cancer Mother        deceased    Social History   Socioeconomic History   Marital status: Single    Spouse name: Not on file   Number of children: 0   Years of education: Not on file   Highest education level: Not on file  Occupational History   Not on file  Tobacco Use   Smoking status: Every Day    Packs/day: .2    Types: Cigarettes   Smokeless tobacco: Never  Vaping Use   Vaping Use: Never used  Substance and Sexual Activity   Alcohol use: No   Drug use: No   Sexual activity: Not Currently  Other Topics Concern   Not on file  Social History Narrative   Patient's POA is his sister, [name] [phone].           Social Determinants of Health   Financial Resource Strain: Low Risk  (09/16/2022)   Overall Financial Resource Strain (CARDIA)    Difficulty of Paying Living Expenses: Not hard at all  Food Insecurity: No Food Insecurity (02/14/2023)   Hunger Vital Sign    Worried About Running Out of Food in the Last Year: Never true  Ran Out of Food in the Last Year: Never true  Transportation Needs: No Transportation Needs (02/14/2023)   PRAPARE - Administrator, Civil Service (Medical): No    Lack of Transportation (Non-Medical): No  Physical Activity: Insufficiently Active (09/16/2022)   Exercise Vital Sign    Days of Exercise per Week: 2 days    Minutes of Exercise per Session: 20 min  Stress: No Stress Concern Present (09/16/2022)   [employer] of Occupational Health - Occupational Stress Questionnaire    Feeling of Stress : Only a little  Social Connections: Socially Isolated (09/16/2022)   Social Connection and Isolation Panel [NHANES]    Frequency of Communication with Friends and Family: Twice a week    Frequency of Social Gatherings with Friends and Family: Once a week    Attends Religious Services: Never    Database administrator or Organizations: No    Attends  Banker Meetings: Never    Marital Status: Never married  Intimate Partner Violence: Not At Risk (09/16/2022)   Humiliation, Afraid, Rape, and Kick questionnaire    Fear of Current or Ex-Partner: No    Emotionally Abused: No    Physically Abused: No    Sexually Abused: No    Outpatient Medications Prior to Visit  Medication Sig Dispense Refill   amLODipine (NORVASC) 5 MG tablet Take 1 tablet (5 mg total) by mouth daily. 90 tablet 3   aspirin EC 81 MG tablet Take 81 mg by mouth daily.     busPIRone (BUSPAR) 5 MG tablet Take 1 tablet (5 mg total) by mouth 2 (two) times daily. 60 tablet 5   Continuous Blood Gluc Receiver (DEXCOM G7 RECEIVER) DEVI Please use it to check blood glucose three times before meals, at bedtime and as needed. 1 each 0   Continuous Blood Gluc Sensor (DEXCOM G7 SENSOR) MISC Please use it to check blood glucose three times before meals, at bedtime and as needed. 3 each 5   dapagliflozin propanediol (FARXIGA) 10 MG TABS tablet Take 1 tablet (10 mg total) by mouth daily before breakfast. 90 tablet 3   Dulaglutide (TRULICITY) 4.5 MG/0.5ML SOPN Inject 4.5 mg as directed once a week. 6 mL 3   DULoxetine (CYMBALTA) 60 MG capsule Take 1 capsule (60 mg total) by mouth daily. 90 capsule 1   gabapentin (NEURONTIN) 400 MG capsule Take 1 capsule (400 mg total) by mouth 3 (three) times daily. 90 capsule 5   Glucose Blood (BLOOD GLUCOSE TEST STRIPS) STRP Use to monitor FSBS 3x daily. Dx: E11.65. (Patient taking differently: 1 each by Other route daily. E11.65.) 100 each 3   ibuprofen (ADVIL) 600 MG tablet Take 1 tablet (600 mg total) by mouth every 6 (six) hours as needed. 30 tablet 0   Misc. Devices MISC Lift chair 1 each 0   nitroGLYCERIN (NITROSTAT) 0.4 MG SL tablet DISSOLVE 1 TABLET UNDER TONGUE EVERY 5 MINUTES UP TO 15 MIN FOR CHEST PAIN. IF NO RELIEF CALL 911. 25 tablet 0   oxyCODONE-acetaminophen (PERCOCET/ROXICET) 5-325 MG tablet Take 1 tablet by mouth every 6 (six)  hours as needed for severe pain. 15 tablet 0   potassium chloride (K-DUR) 10 MEQ tablet Take 1 tablet (10 mEq total) by mouth 2 (two) times daily. Take with fluid pill 180 tablet 3   propranolol (INDERAL) 20 MG tablet Take 1 tablet (20 mg total) by mouth 2 (two) times daily. 60 tablet 3   rosuvastatin (CRESTOR) 10 MG tablet  Take 1 tablet (10 mg total) by mouth daily. 90 tablet 1   triamcinolone cream (KENALOG) 0.1 % Apply 1 Application topically 2 (two) times daily. 30 g 0   HYDROcodone-acetaminophen (NORCO/VICODIN) 5-325 MG tablet Take 1 tablet by mouth every 6 (six) hours as needed for moderate pain or severe pain. 30 tablet 0   terbinafine (LAMISIL) 250 MG tablet Take 1 tablet (250 mg total) by mouth daily. 84 tablet 0   No facility-administered medications prior to visit.    Allergies  Allergen Reactions   Lisinopril Cough    ROS Review of Systems  Constitutional:  Negative for chills and fever.  HENT:  Negative for congestion and sore throat.   Eyes:  Negative for pain and discharge.  Respiratory:  Negative for cough and shortness of breath.        Chest wall pain  Cardiovascular:  Negative for palpitations.  Gastrointestinal:  Negative for constipation, diarrhea, nausea and vomiting.  Endocrine: Negative for polydipsia and polyuria.  Genitourinary:  Negative for dysuria and hematuria.  Musculoskeletal:  Positive for arthralgias, back pain and gait problem. Negative for neck pain and neck stiffness.  Skin:  Positive for rash.  Neurological:  Positive for numbness (b/l feet, 5th digits - b/l hands). Negative for dizziness, weakness and headaches.  Psychiatric/Behavioral:  Negative for agitation and behavioral problems.       Objective:    Physical Exam Vitals reviewed.  Constitutional:      General: He is not in acute distress.    Appearance: He is not diaphoretic.     Comments: Has a cane for walking support  HENT:     Head: Normocephalic and atraumatic.     Nose:  Nose normal.     Mouth/Throat:     Mouth: Mucous membranes are moist.  Eyes:     General: No scleral icterus.    Extraocular Movements: Extraocular movements intact.  Cardiovascular:     Rate and Rhythm: Normal rate and regular rhythm.     Heart sounds: Normal heart sounds. No murmur heard. Pulmonary:     Breath sounds: Normal breath sounds. No wheezing or rales.  Chest:     Chest wall: Tenderness (Right-sided) present.  Musculoskeletal:     Cervical back: Neck supple. No tenderness.     Right lower leg: No edema.     Left lower leg: No edema.  Feet:     Right foot:     Toenail Condition: Right toenails are abnormally thick. Fungal disease present.    Left foot:     Toenail Condition: Left toenails are abnormally thick.  Skin:    General: Skin is warm.     Findings: Rash (Maculopapular rash over erythematous base - over b/l upper extremity) present.  Neurological:     General: No focal deficit present.     Mental Status: He is alert and oriented to person, place, and time.     Motor: Weakness (b/l LE, 4/5) present.     Gait: Gait abnormal.     Comments: Rapid and slurred speech, appears to be at baseline  Psychiatric:        Mood and Affect: Mood normal.        Behavior: Behavior normal.     BP 105/63 (BP Location: Left Arm, Patient Position: Sitting, Cuff Size: Large)   Pulse 91   Ht 5\' 8"  (1.727 m)   Wt 234 lb (106.1 kg)   SpO2 95%   BMI 35.58 kg/m  Wt  Readings from Last 3 Encounters:  02/23/23 234 lb (106.1 kg)  02/21/23 233 lb (105.7 kg)  02/11/23 241 lb (109.3 kg)    Lab Results  Component Value Date   TSH 0.919 04/25/2022   Lab Results  Component Value Date   WBC 9.5 02/11/2023   HGB 15.8 02/11/2023   HCT 46.3 02/11/2023   MCV 96.5 02/11/2023   PLT 168 02/11/2023   Lab Results  Component Value Date   NA 136 02/11/2023   K 3.7 02/11/2023   CO2 27 02/11/2023   GLUCOSE 93 02/11/2023   BUN 12 02/11/2023   CREATININE 0.74 02/11/2023   BILITOT 0.5  04/25/2022   ALKPHOS 82 04/25/2022   AST 21 04/25/2022   ALT 16 04/25/2022   PROT 7.1 04/25/2022   ALBUMIN 4.3 04/25/2022   CALCIUM 8.4 (L) 02/11/2023   ANIONGAP 5 02/11/2023   EGFR 98 04/25/2022   GFR 94.60 08/27/2021   Lab Results  Component Value Date   CHOL 110 04/25/2022   Lab Results  Component Value Date   HDL 41 04/25/2022   Lab Results  Component Value Date   LDLCALC 52 04/25/2022   Lab Results  Component Value Date   TRIG 89 04/25/2022   Lab Results  Component Value Date   CHOLHDL 2.7 04/25/2022   Lab Results  Component Value Date   HGBA1C 6.1 02/23/2023      Assessment & Plan:   Problem List Items Addressed This Visit       Cardiovascular and Mediastinum   Essential hypertension, benign    BP Readings from Last 1 Encounters:  02/23/23 105/63  Well-controlled with Amlodipine Counseled for compliance with the medications Advised DASH diet and moderate exercise/walking, at least 150 mins/week      Relevant Orders   TSH   CMP14+EGFR   CBC with Differential/Platelet     Endocrine   Diabetes mellitus with neurological manifestation (HCC) - Primary    Lab Results  Component Value Date   HGBA1C 6.7 11/08/2022  Well-controlled overall On Trulicity and Farxiga Did not tolerate metformin, had GI discomfort  Advised to follow diabetic diet On statin Diabetic eye exam: Advised to follow up with Ophthalmology for diabetic eye exam      Relevant Orders   Hemoglobin A1c   CMP14+EGFR   POCT glycosylated hemoglobin (Hb A1C) (Completed)   Neuropathy, diabetic (HCC)    On Cymbalta Needs to continue to take gabapentin as well Used to take Norco PRN, advised patient to avoid taking Norco  Difficulty getting up from chair/bed, likely due to peripheral neuropathy in addition to hip OA and DDD of lumbar spine        Nervous and Auditory   Essential tremor    Well controlled with propranolol Continue gabapentin for diabetic neuropathy He has  denied neurology referral in the past      Relevant Orders   TSH   CMP14+EGFR   CBC with Differential/Platelet     Musculoskeletal and Integument   Closed displaced fracture of shaft of right clavicle    Due to recent injury from a mechanical fall Followed by orthopedic surgeon      Multiple closed fractures of ribs of right side    Has multiple right-sided rib fractures due to her mechanical fall recently Since it is unilateral and did not have any injury to the lungs noted on CT chest, conservative management Lidocaine patch for pain      Relevant Medications   lidocaine (LIDODERM)  5 %     Other   Hyperlipidemia    On Crestor Check lipid profile      Relevant Orders   Lipid panel   Prostate cancer screening    Ordered PSA after discussing its limitations for prostate cancer screening, including false positive results leading to additional investigations.      Relevant Orders   PSA   Other Visit Diagnoses     Vitamin D deficiency       Relevant Orders   VITAMIN D 25 Hydroxy (Vit-D Deficiency, Fractures)      Meds ordered this encounter  Medications   lidocaine (LIDODERM) 5 %    Sig: Place 1 patch onto the skin daily. Remove & Discard patch within 12 hours or as directed by MD    Dispense:  30 patch    Refill:  0    Follow-up: Return in about 3 months (around 05/26/2023) for Annual physical.    Anabel Halon, MD

## 2023-02-23 NOTE — Assessment & Plan Note (Addendum)
Lab Results  Component Value Date   HGBA1C 6.7 11/08/2022   Well-controlled overall On Trulicity and Farxiga Did not tolerate metformin, had GI discomfort  Advised to follow diabetic diet On statin Diabetic eye exam: Advised to follow up with Ophthalmology for diabetic eye exam

## 2023-02-23 NOTE — Assessment & Plan Note (Signed)
On Crestor Check lipid profile 

## 2023-02-23 NOTE — Assessment & Plan Note (Signed)
Due to recent injury from a mechanical fall Followed by orthopedic surgeon

## 2023-02-23 NOTE — Assessment & Plan Note (Signed)
Has multiple right-sided rib fractures due to her mechanical fall recently Since it is unilateral and did not have any injury to the lungs noted on CT chest, conservative management Lidocaine patch for pain

## 2023-03-14 ENCOUNTER — Ambulatory Visit (INDEPENDENT_AMBULATORY_CARE_PROVIDER_SITE_OTHER): Payer: 59 | Admitting: Orthopedic Surgery

## 2023-03-14 ENCOUNTER — Encounter: Payer: Self-pay | Admitting: Orthopedic Surgery

## 2023-03-14 ENCOUNTER — Other Ambulatory Visit (INDEPENDENT_AMBULATORY_CARE_PROVIDER_SITE_OTHER): Payer: 59

## 2023-03-14 DIAGNOSIS — S42001A Fracture of unspecified part of right clavicle, initial encounter for closed fracture: Secondary | ICD-10-CM | POA: Diagnosis not present

## 2023-03-14 DIAGNOSIS — S42031D Displaced fracture of lateral end of right clavicle, subsequent encounter for fracture with routine healing: Secondary | ICD-10-CM

## 2023-03-14 NOTE — Progress Notes (Signed)
Return patient Visit  Assessment: Kevin [name] is a 67 y.o. male with the following: 1. Closed displaced fracture of right clavicle, unspecified part of clavicle, subsequent encounter  Plan: Kevin [name] fell and sustained a right clavicle fracture.  Radiographs demonstrates interval consolidation.  There has not been further displacement.  On physical exam, he does have some tenderness to palpation along the clavicle.  He also appears to be having some spasm type pain.  He has not been using a sling as recommended.  Continue with medications like Tylenol Motrin.  Consider topical treatments.  Anticipate that this will continue to improve as the bone heals.  Follow-up in 3 weeks.  Follow-up: Return in about 3 weeks (around 04/04/2023).  Subjective:  Chief Complaint  Patient presents with   Right shoulder pain    Clavicle Fx DOI 02/11/23     History of Present Illness: Kevin [name] is a 67 y.o. male who returns for evaluation of right shoulder pain.  He fell and sustained a fracture of his right clavicle, approximately 4 weeks ago.  He has not been using a sling to provide support for his right arm.  He states that when he has been wearing the sling, his arm goes numb.  He has some pain in the shoulder.  He has sharp pains.  He has limited use of his right arm.  He is taking Tylenol and ibuprofen for pain relief.  He has been using some topical treatments.   Review of Systems: No fevers or chills No numbness or tingling No chest pain No shortness of breath No bowel or bladder dysfunction No GI distress No headaches   Objective: There were no vitals taken for this visit.  Physical Exam:  General: Alert and oriented. and No acute distress. Gait: Normal gait.  Right shoulder with deformity over the clavicle.  No bruising over the clavicle.  He continues to have tenderness to palpation.  He has some spasm around the right shoulder.  Sensation over the lateral  shoulder is intact.  He has sensation throughout the right hand.  Fingers are warm and well-perfused.   IMAGING: I personally ordered and reviewed the following images  X-rays of the right clavicle were obtained in clinic today.  These are compared to prior x-rays.  There is a mildly displaced fracture of the distal right clavicle.  There has been some interval consolidation.  No further displacement.  Glenohumeral joint is intact.  No bony lesions.  Impression: Stable right clavicle fracture  New Medications:  No orders of the defined types were placed in this encounter.     Oliver Barre, MD  03/14/2023 2:35 PM

## 2023-03-23 ENCOUNTER — Encounter: Payer: 59 | Admitting: Internal Medicine

## 2023-04-03 ENCOUNTER — Encounter: Payer: 59 | Admitting: Internal Medicine

## 2023-04-04 ENCOUNTER — Ambulatory Visit: Payer: 59 | Admitting: Orthopedic Surgery

## 2023-04-11 ENCOUNTER — Encounter: Payer: Self-pay | Admitting: *Deleted

## 2023-04-24 ENCOUNTER — Encounter: Payer: Self-pay | Admitting: Internal Medicine

## 2023-04-24 ENCOUNTER — Ambulatory Visit (INDEPENDENT_AMBULATORY_CARE_PROVIDER_SITE_OTHER): Payer: 59 | Admitting: Internal Medicine

## 2023-04-24 VITALS — BP 117/69 | HR 77 | Ht 68.0 in | Wt 234.0 lb

## 2023-04-24 DIAGNOSIS — E1140 Type 2 diabetes mellitus with diabetic neuropathy, unspecified: Secondary | ICD-10-CM | POA: Diagnosis not present

## 2023-04-24 DIAGNOSIS — Z0001 Encounter for general adult medical examination with abnormal findings: Secondary | ICD-10-CM | POA: Diagnosis not present

## 2023-04-24 DIAGNOSIS — G250 Essential tremor: Secondary | ICD-10-CM | POA: Diagnosis not present

## 2023-04-24 DIAGNOSIS — I739 Peripheral vascular disease, unspecified: Secondary | ICD-10-CM

## 2023-04-24 DIAGNOSIS — I10 Essential (primary) hypertension: Secondary | ICD-10-CM

## 2023-04-24 DIAGNOSIS — Z1211 Encounter for screening for malignant neoplasm of colon: Secondary | ICD-10-CM

## 2023-04-24 DIAGNOSIS — M170 Bilateral primary osteoarthritis of knee: Secondary | ICD-10-CM | POA: Diagnosis not present

## 2023-04-24 DIAGNOSIS — Z794 Long term (current) use of insulin: Secondary | ICD-10-CM

## 2023-04-24 DIAGNOSIS — E0843 Diabetes mellitus due to underlying condition with diabetic autonomic (poly)neuropathy: Secondary | ICD-10-CM

## 2023-04-24 DIAGNOSIS — E559 Vitamin D deficiency, unspecified: Secondary | ICD-10-CM | POA: Diagnosis not present

## 2023-04-24 MED ORDER — IBUPROFEN 600 MG PO TABS: 600.0000 mg | ORAL_TABLET | Freq: Three times a day (TID) | ORAL | 0 refills | Status: DC | PRN

## 2023-04-24 MED ORDER — CAPSAICIN 0.1 % EX CREA
1.0000 | TOPICAL_CREAM | Freq: Every day | CUTANEOUS | 2 refills | Status: DC
Start: 2023-04-24 — End: 2024-05-02

## 2023-04-24 MED ORDER — DULOXETINE HCL 60 MG PO CPEP: 60.0000 mg | ORAL_CAPSULE | Freq: Every day | ORAL | 1 refills | Status: DC

## 2023-04-24 NOTE — Assessment & Plan Note (Signed)
Lab Results  Component Value Date   HGBA1C 6.1 02/23/2023   Well-controlled overall On Trulicity and Farxiga Did not tolerate metformin, had GI discomfort  Advised to follow diabetic diet On statin Diabetic eye exam: Advised to follow up with Ophthalmology for diabetic eye exam

## 2023-04-24 NOTE — Assessment & Plan Note (Signed)

## 2023-04-24 NOTE — Assessment & Plan Note (Signed)
BP Readings from Last 1 Encounters:  04/24/23 117/69   Well-controlled with Amlodipine Counseled for compliance with the medications Advised DASH diet and moderate exercise/walking, at least 150 mins/week

## 2023-04-24 NOTE — Patient Instructions (Addendum)
Please take Gabapentin and Duloxetine as prescribed for burning of the foot.  Please take Ibuprofen as needed for knee and ankle pain.  Please continue to take medications as prescribed.  Please continue to follow low carb diet and ambulate as tolerated.  Please apply Caspaicin cream over foot for neuropathic pain.

## 2023-04-24 NOTE — Assessment & Plan Note (Addendum)
His leg symptoms are also suggestive of PAD Check [place] ABI screening

## 2023-04-24 NOTE — Assessment & Plan Note (Signed)
Tylenol PRN Has had steroid injections in the past Uses cane for walking support Has lift chair for assistance to/from chair Prescribed ibuprofen as needed for pain

## 2023-04-24 NOTE — Assessment & Plan Note (Addendum)
On Cymbalta Needs to continue to take gabapentin as well, takes only qHS Used to take Norco PRN, advised patient to avoid taking Norco Added Capsaicin cream  Difficulty getting up from chair/bed, likely due to peripheral neuropathy in addition to hip OA and DDD of lumbar spine

## 2023-04-24 NOTE — Assessment & Plan Note (Signed)
Well controlled with propranolol Continue gabapentin for diabetic neuropathy He has denied neurology referral in the past

## 2023-04-24 NOTE — Progress Notes (Signed)
Established Patient Office Visit  Subjective:  Patient ID: Kevin [name], male    DOB: Jul 03, 1956  Age: 67 y.o. MRN: [id number]  CC:  Chief Complaint  Patient presents with   Annual Exam   Knee Pain    Patient states his right knee is in pain , and throbbing    HPI Kevin [name] is a 67 y.o. male with past medical history of DM with neuropathy, DDD of cervical spine and tobacco abuse who presents for annual physical.  Type II DM with neuropathy: He takes [place] and Trulicity for DM.  His HbA1c was 6.1 today. He has had labile blood glucose with hypoglycemic episodes in the past, up to 50s.  He does not check blood glucose regularly, but denies any recent hypoglycemia when he has checked it.  He takes duloxetine for diabetic neuropathy. He still c/o neuropathic pain in feet and takes Norco PRN for severe pain, but has noticed improvement recently with Gabapentin.   HTN: BP is well-controlled. Takes medications regularly. Patient denies headache, dizziness, chest pain, dyspnea or palpitations.   He reports improvement in tremors in both hands with propranolol.  He had difficulty writing due to tremors.  Denies any shaking movements of the legs.  His tremors are mostly upon movement.  He reports chronic pain in bilateral knee, which is sharp, associated with swelling.  He has tried taking Tylenol without much relief.  He also reports pain in the right lower leg and foot area, which is throbbing and worse with walking.  Denies any pallor or cold sensation.  He has chronic numbness of the foot, which is also likely due to diabetic neuropathy.   Past Medical History:  Diagnosis Date   Arthritis    Chronic knee pain    Depression    Diabetes mellitus (HCC)    Hyperlipidemia    Hypertension    Leg edema    Obesity     Past Surgical History:  Procedure Laterality Date   none      Family History  Problem Relation Age of Onset   Heart disease Unknown    Arthritis Unknown     Diabetes Unknown    Cancer Mother        deceased    Social History   Socioeconomic History   Marital status: Single    Spouse name: Not on file   Number of children: 0   Years of education: Not on file   Highest education level: Not on file  Occupational History   Not on file  Tobacco Use   Smoking status: Every Day    Current packs/day: 0.20    Types: Cigarettes   Smokeless tobacco: Never  Vaping Use   Vaping status: Never Used  Substance and Sexual Activity   Alcohol use: No   Drug use: No   Sexual activity: Not Currently  Other Topics Concern   Not on file  Social History Narrative   Patient's POA is his sister, [name] [phone].           Social Determinants of Health   Financial Resource Strain: Low Risk  (09/16/2022)   Overall Financial Resource Strain (CARDIA)    Difficulty of Paying Living Expenses: Not hard at all  Food Insecurity: No Food Insecurity (02/14/2023)   Hunger Vital Sign    Worried About Running Out of Food in the Last Year: Never true    Ran Out of Food in the Last Year: Never true  Transportation Needs: No Transportation Needs (02/14/2023)   PRAPARE - Administrator, Civil Service (Medical): No    Lack of Transportation (Non-Medical): No  Physical Activity: Insufficiently Active (09/16/2022)   Exercise Vital Sign    Days of Exercise per Week: 2 days    Minutes of Exercise per Session: 20 min  Stress: No Stress Concern Present (09/16/2022)   [employer] of Occupational Health - Occupational Stress Questionnaire    Feeling of Stress : Only a little  Social Connections: Socially Isolated (09/16/2022)   Social Connection and Isolation Panel [NHANES]    Frequency of Communication with Friends and Family: Twice a week    Frequency of Social Gatherings with Friends and Family: Once a week    Attends Religious Services: Never    Database administrator or Organizations: No    Attends Banker Meetings: Never     Marital Status: Never married  Intimate Partner Violence: Not At Risk (09/16/2022)   Humiliation, Afraid, Rape, and Kick questionnaire    Fear of Current or Ex-Partner: No    Emotionally Abused: No    Physically Abused: No    Sexually Abused: No    Outpatient Medications Prior to Visit  Medication Sig Dispense Refill   amLODipine (NORVASC) 5 MG tablet Take 1 tablet (5 mg total) by mouth daily. 90 tablet 3   aspirin EC 81 MG tablet Take 81 mg by mouth daily.     busPIRone (BUSPAR) 5 MG tablet Take 1 tablet (5 mg total) by mouth 2 (two) times daily. 60 tablet 5   Continuous Blood Gluc Receiver (DEXCOM G7 RECEIVER) DEVI Please use it to check blood glucose three times before meals, at bedtime and as needed. 1 each 0   Continuous Blood Gluc Sensor (DEXCOM G7 SENSOR) MISC Please use it to check blood glucose three times before meals, at bedtime and as needed. 3 each 5   dapagliflozin propanediol (FARXIGA) 10 MG TABS tablet Take 1 tablet (10 mg total) by mouth daily before breakfast. 90 tablet 3   Dulaglutide (TRULICITY) 4.5 MG/0.5ML SOPN Inject 4.5 mg as directed once a week. 6 mL 3   gabapentin (NEURONTIN) 400 MG capsule Take 1 capsule (400 mg total) by mouth 3 (three) times daily. 90 capsule 5   Glucose Blood (BLOOD GLUCOSE TEST STRIPS) STRP Use to monitor FSBS 3x daily. Dx: E11.65. (Patient taking differently: 1 each by Other route daily. E11.65.) 100 each 3   lidocaine (LIDODERM) 5 % Place 1 patch onto the skin daily. Remove & Discard patch within 12 hours or as directed by MD 30 patch 0   Misc. Devices MISC Lift chair 1 each 0   nitroGLYCERIN (NITROSTAT) 0.4 MG SL tablet DISSOLVE 1 TABLET UNDER TONGUE EVERY 5 MINUTES UP TO 15 MIN FOR CHEST PAIN. IF NO RELIEF CALL 911. 25 tablet 0   potassium chloride (K-DUR) 10 MEQ tablet Take 1 tablet (10 mEq total) by mouth 2 (two) times daily. Take with fluid pill 180 tablet 3   propranolol (INDERAL) 20 MG tablet Take 1 tablet (20 mg total) by mouth 2 (two)  times daily. 60 tablet 3   rosuvastatin (CRESTOR) 10 MG tablet Take 1 tablet (10 mg total) by mouth daily. 90 tablet 1   triamcinolone cream (KENALOG) 0.1 % Apply 1 Application topically 2 (two) times daily. 30 g 0   DULoxetine (CYMBALTA) 60 MG capsule Take 1 capsule (60 mg total) by mouth daily. 90 capsule 1  ibuprofen (ADVIL) 600 MG tablet Take 1 tablet (600 mg total) by mouth every 6 (six) hours as needed. 30 tablet 0   oxyCODONE-acetaminophen (PERCOCET/ROXICET) 5-325 MG tablet Take 1 tablet by mouth every 6 (six) hours as needed for severe pain. 15 tablet 0   No facility-administered medications prior to visit.    Allergies  Allergen Reactions   Lisinopril Cough    ROS Review of Systems  Constitutional:  Negative for chills and fever.  HENT:  Negative for congestion and sore throat.   Eyes:  Negative for pain and discharge.  Respiratory:  Negative for cough and shortness of breath.   Cardiovascular:  Negative for chest pain and palpitations.  Gastrointestinal:  Negative for constipation, diarrhea, nausea and vomiting.  Endocrine: Negative for polydipsia and polyuria.  Genitourinary:  Negative for dysuria and hematuria.  Musculoskeletal:  Positive for arthralgias, back pain and gait problem. Negative for neck pain and neck stiffness.  Skin:  Positive for rash.  Neurological:  Positive for numbness (b/l feet, 5th digits - b/l hands). Negative for dizziness, weakness and headaches.  Psychiatric/Behavioral:  Negative for agitation and behavioral problems.       Objective:    Physical Exam Vitals reviewed.  Constitutional:      General: He is not in acute distress.    Appearance: He is not diaphoretic.     Comments: Has a cane for walking support  HENT:     Head: Normocephalic and atraumatic.     Nose: Nose normal.     Mouth/Throat:     Mouth: Mucous membranes are moist.  Eyes:     General: No scleral icterus.    Extraocular Movements: Extraocular movements intact.   Cardiovascular:     Rate and Rhythm: Normal rate and regular rhythm.     Heart sounds: Normal heart sounds. No murmur heard.    Comments: DPA pulse - 1+ b/l Pulmonary:     Breath sounds: Normal breath sounds. No wheezing or rales.  Abdominal:     Palpations: Abdomen is soft.     Tenderness: There is no abdominal tenderness.  Musculoskeletal:     Cervical back: Neck supple. No tenderness.     Right lower leg: No edema.     Left lower leg: No edema.  Skin:    General: Skin is warm.     Findings: Rash (Maculopapular rash over erythematous base - over b/l upper extremity) present.  Neurological:     General: No focal deficit present.     Mental Status: He is alert and oriented to person, place, and time.     Motor: Weakness (b/l LE, 4/5) present.     Gait: Gait abnormal.     Comments: Rapid and slurred speech, appears to be at baseline  Psychiatric:        Mood and Affect: Mood normal.        Behavior: Behavior normal.     BP 117/69 (BP Location: Left Arm, Patient Position: Sitting, Cuff Size: Normal)   Pulse 77   Ht 5\' 8"  (1.727 m)   Wt 234 lb (106.1 kg)   SpO2 94%   BMI 35.58 kg/m  Wt Readings from Last 3 Encounters:  04/24/23 234 lb (106.1 kg)  02/23/23 234 lb (106.1 kg)  02/21/23 233 lb (105.7 kg)    Lab Results  Component Value Date   TSH 0.919 04/25/2022   Lab Results  Component Value Date   WBC 9.5 02/11/2023   HGB 15.8 02/11/2023  HCT 46.3 02/11/2023   MCV 96.5 02/11/2023   PLT 168 02/11/2023   Lab Results  Component Value Date   NA 136 02/11/2023   K 3.7 02/11/2023   CO2 27 02/11/2023   GLUCOSE 93 02/11/2023   BUN 12 02/11/2023   CREATININE 0.74 02/11/2023   BILITOT 0.5 04/25/2022   ALKPHOS 82 04/25/2022   AST 21 04/25/2022   ALT 16 04/25/2022   PROT 7.1 04/25/2022   ALBUMIN 4.3 04/25/2022   CALCIUM 8.4 (L) 02/11/2023   ANIONGAP 5 02/11/2023   EGFR 98 04/25/2022   GFR 94.60 08/27/2021   Lab Results  Component Value Date   CHOL 110  04/25/2022   Lab Results  Component Value Date   HDL 41 04/25/2022   Lab Results  Component Value Date   LDLCALC 52 04/25/2022   Lab Results  Component Value Date   TRIG 89 04/25/2022   Lab Results  Component Value Date   CHOLHDL 2.7 04/25/2022   Lab Results  Component Value Date   HGBA1C 6.1 02/23/2023      Assessment & Plan:   Problem List Items Addressed This Visit       Cardiovascular and Mediastinum   Essential hypertension, benign    BP Readings from Last 1 Encounters:  04/24/23 117/69   Well-controlled with Amlodipine Counseled for compliance with the medications Advised DASH diet and moderate exercise/walking, at least 150 mins/week        Endocrine   Diabetes mellitus with neurological manifestation (HCC)    Lab Results  Component Value Date   HGBA1C 6.1 02/23/2023   Well-controlled overall On Trulicity and Farxiga Did not tolerate metformin, had GI discomfort  Advised to follow diabetic diet On statin Diabetic eye exam: Advised to follow up with Ophthalmology for diabetic eye exam      Relevant Orders   Urine Microalbumin w/creat. ratio   Neuropathy, diabetic (HCC)    On Cymbalta Needs to continue to take gabapentin as well, takes only qHS Used to take Norco PRN, advised patient to avoid taking Norco Added Capsaicin cream  Difficulty getting up from chair/bed, likely due to peripheral neuropathy in addition to hip OA and DDD of lumbar spine      Relevant Medications   DULoxetine (CYMBALTA) 60 MG capsule   Capsaicin 0.1 % CREA     Nervous and Auditory   Essential tremor    Well controlled with propranolol Continue gabapentin for diabetic neuropathy He has denied neurology referral in the past        Musculoskeletal and Integument   OA (osteoarthritis) of knee    Tylenol PRN Has had steroid injections in the past Uses cane for walking support Has lift chair for assistance to/from chair Prescribed ibuprofen as needed for  pain      Relevant Medications   ibuprofen (ADVIL) 600 MG tablet     Other   Morbid obesity (HCC)    BMI Readings from Last 3 Encounters:  04/24/23 35.58 kg/m  02/23/23 35.58 kg/m  02/21/23 35.43 kg/m   Associated with type II DM, HLD, HTN, OA On GLP-1 agonist for type II DM Advised to follow low carb diet and perform moderate exercise as tolerated      Encounter for general adult medical examination with abnormal findings - Primary    Physical exam as documented. Counseling done  re healthy lifestyle involving commitment to 150 minutes exercise per week, heart healthy diet, and attaining healthy weight.The importance of adequate sleep also  discussed. Changes in health habits are decided on by the patient with goals and time frames  set for achieving them. Immunization and cancer screening needs are specifically addressed at this visit.      Claudication of left lower extremity (HCC)    His leg symptoms are also suggestive of PAD Check [place] ABI screening      Relevant Medications   DULoxetine (CYMBALTA) 60 MG capsule   ibuprofen (ADVIL) 600 MG tablet   Other Relevant Orders   US ARTERIAL ABI (SCREENING LOWER EXTREMITY)   Other Visit Diagnoses     Colon cancer screening       Relevant Orders   Cologuard       Meds ordered this encounter  Medications   DULoxetine (CYMBALTA) 60 MG capsule    Sig: Take 1 capsule (60 mg total) by mouth daily.    Dispense:  90 capsule    Refill:  1   ibuprofen (ADVIL) 600 MG tablet    Sig: Take 1 tablet (600 mg total) by mouth every 8 (eight) hours as needed.    Dispense:  30 tablet    Refill:  0   Capsaicin 0.1 % CREA    Sig: Apply 1 Application topically daily.    Dispense:  60 g    Refill:  2    Follow-up: Return in about 4 months (around 08/25/2023) for DM and HTN.    Anabel Halon, MD

## 2023-04-24 NOTE — Assessment & Plan Note (Signed)
BMI Readings from Last 3 Encounters:  04/24/23 35.58 kg/m  02/23/23 35.58 kg/m  02/21/23 35.43 kg/m   Associated with type II DM, HLD, HTN, OA On GLP-1 agonist for type II DM Advised to follow low carb diet and perform moderate exercise as tolerated

## 2023-04-25 LAB — CMP14+EGFR
AST: 15 IU/L (ref 0–40)
Albumin: 4.2 g/dL (ref 3.9–4.9)
CO2: 23 mmol/L (ref 20–29)
Potassium: 4.8 mmol/L (ref 3.5–5.2)
Total Protein: 7 g/dL (ref 6.0–8.5)

## 2023-04-25 LAB — CBC WITH DIFFERENTIAL/PLATELET
Basophils Absolute: 0.1 10*3/uL (ref 0.0–0.2)
EOS (ABSOLUTE): 0.5 10*3/uL — ABNORMAL HIGH (ref 0.0–0.4)
Hemoglobin: 16.2 g/dL (ref 13.0–17.7)
Lymphocytes Absolute: 2.4 10*3/uL (ref 0.7–3.1)
RBC: 4.91 x10E6/uL (ref 4.14–5.80)

## 2023-04-26 LAB — LIPID PANEL
Chol/HDL Ratio: 2.6 ratio (ref 0.0–5.0)
Cholesterol, Total: 112 mg/dL (ref 100–199)
HDL: 43 mg/dL (ref >39)
LDL Chol Calc (NIH): 52 mg/dL (ref 0–99)
Triglycerides: 90 mg/dL (ref 0–149)
VLDL Cholesterol Cal: 17 mg/dL (ref 5–40)

## 2023-04-26 LAB — CBC WITH DIFFERENTIAL/PLATELET
Basos: 1 %
Eos: 5 %
Hematocrit: 45.7 % (ref 37.5–51.0)
Immature Grans (Abs): 0 10*3/uL (ref 0.0–0.1)
Immature Granulocytes: 0 %
Lymphs: 27 %
MCH: 33 pg (ref 26.6–33.0)
MCHC: 35.4 g/dL (ref 31.5–35.7)
MCV: 93 fL (ref 79–97)
Monocytes Absolute: 1 10*3/uL — ABNORMAL HIGH (ref 0.1–0.9)
Monocytes: 11 %
Neutrophils Absolute: 5 10*3/uL (ref 1.4–7.0)
Neutrophils: 56 %
Platelets: 185 10*3/uL (ref 150–450)
RDW: 11.5 % — ABNORMAL LOW (ref 11.6–15.4)
WBC: 9 10*3/uL (ref 3.4–10.8)

## 2023-04-26 LAB — CMP14+EGFR
ALT: 14 IU/L (ref 0–44)
Alkaline Phosphatase: 98 IU/L (ref 44–121)
BUN/Creatinine Ratio: 11 (ref 10–24)
BUN: 11 mg/dL (ref 8–27)
Bilirubin Total: 0.5 mg/dL (ref 0.0–1.2)
Calcium: 9.3 mg/dL (ref 8.6–10.2)
Chloride: 104 mmol/L (ref 96–106)
Creatinine, Ser: 0.97 mg/dL (ref 0.76–1.27)
Globulin, Total: 2.8 g/dL (ref 1.5–4.5)
Glucose: 93 mg/dL (ref 70–99)
Sodium: 143 mmol/L (ref 134–144)
eGFR: 87 mL/min/{1.73_m2} (ref >59)

## 2023-04-26 LAB — PSA: Prostate Specific Ag, Serum: 0.2 ng/mL (ref 0.0–4.0)

## 2023-04-26 LAB — VITAMIN D 25 HYDROXY (VIT D DEFICIENCY, FRACTURES): Vit D, 25-Hydroxy: 46.5 ng/mL (ref 30.0–100.0)

## 2023-04-26 LAB — TSH: TSH: 0.906 u[IU]/mL (ref 0.450–4.500)

## 2023-04-26 LAB — HEMOGLOBIN A1C
Est. average glucose Bld gHb Est-mCnc: 137 mg/dL
Hgb A1c MFr Bld: 6.4 % — ABNORMAL HIGH (ref 4.8–5.6)

## 2023-04-27 DIAGNOSIS — B351 Tinea unguium: Secondary | ICD-10-CM | POA: Diagnosis not present

## 2023-04-27 DIAGNOSIS — E1142 Type 2 diabetes mellitus with diabetic polyneuropathy: Secondary | ICD-10-CM | POA: Diagnosis not present

## 2023-04-27 DIAGNOSIS — L84 Corns and callosities: Secondary | ICD-10-CM | POA: Diagnosis not present

## 2023-04-27 DIAGNOSIS — M79676 Pain in unspecified toe(s): Secondary | ICD-10-CM | POA: Diagnosis not present

## 2023-04-27 LAB — MICROALBUMIN / CREATININE URINE RATIO
Creatinine, Urine: 113.6 mg/dL
Microalb/Creat Ratio: 47 mg/g creat — ABNORMAL HIGH (ref 0–29)
Microalbumin, Urine: 53.1 ug/mL

## 2023-04-28 ENCOUNTER — Ambulatory Visit (HOSPITAL_COMMUNITY)
Admission: RE | Admit: 2023-04-28 | Discharge: 2023-04-28 | Disposition: A | Discharge status: Home / Self Care | Payer: 59 | Source: Ambulatory Visit | Attending: Internal Medicine | Admitting: Internal Medicine

## 2023-04-28 DIAGNOSIS — I739 Peripheral vascular disease, unspecified: Secondary | ICD-10-CM | POA: Insufficient documentation

## 2023-06-01 ENCOUNTER — Encounter: Payer: 59 | Admitting: Internal Medicine

## 2023-06-13 ENCOUNTER — Other Ambulatory Visit: Payer: Self-pay

## 2023-06-13 ENCOUNTER — Telehealth: Payer: Self-pay | Admitting: Internal Medicine

## 2023-06-13 DIAGNOSIS — Z794 Long term (current) use of insulin: Secondary | ICD-10-CM

## 2023-06-13 MED ORDER — TRULICITY 4.5 MG/0.5ML ~~LOC~~ SOAJ
4.5000 mg | SUBCUTANEOUS | 3 refills | Status: DC
Start: 2023-06-13 — End: 2024-05-02

## 2023-06-13 NOTE — Telephone Encounter (Signed)
Refills sent to pharmacy. 

## 2023-06-13 NOTE — Telephone Encounter (Signed)
Prescription Request  06/13/2023  LOV: 04/24/2023  What is the name of the medication or equipment? Dulaglutide (TRULICITY) 4.5 MG/0.5ML SOPN [[id number]]   Have you contacted your pharmacy to request a refill? No   Which pharmacy would you like this sent to?  [facility] - [place], [place] - [address] [place] [address] Phone: [phone] Fax: [phone]    Patient notified that their request is being sent to the clinical staff for review and that they should receive a response within 2 business days.   Please advise at Mobile [phone] (mobile)

## 2023-06-20 ENCOUNTER — Ambulatory Visit: Payer: 59

## 2023-06-20 NOTE — Progress Notes (Signed)
Erroneous Encounter

## 2023-06-28 ENCOUNTER — Other Ambulatory Visit: Payer: Self-pay

## 2023-06-28 ENCOUNTER — Telehealth: Payer: Self-pay | Admitting: Internal Medicine

## 2023-06-28 DIAGNOSIS — F419 Anxiety disorder, unspecified: Secondary | ICD-10-CM

## 2023-06-28 MED ORDER — BUSPIRONE HCL 5 MG PO TABS
5.0000 mg | ORAL_TABLET | Freq: Two times a day (BID) | ORAL | 5 refills | Status: DC
Start: 2023-06-28 — End: 2024-01-01

## 2023-06-28 NOTE — Telephone Encounter (Signed)
Prescription Request  06/28/2023  LOV: 04/24/2023  What is the name of the medication or equipment? busPIRone (BUSPAR) 5 MG tablet [[id number]]   Have you contacted your pharmacy to request a refill? Yes   Which pharmacy would you like this sent to?  [facility] - [place], [place] - [address] [place] [address] Phone: [phone] Fax: [phone]    Patient notified that their request is being sent to the clinical staff for review and that they should receive a response within 2 business days.   Please advise at Mobile [phone] (mobile)

## 2023-06-28 NOTE — Telephone Encounter (Signed)
Refills sent to pharmacy. 

## 2023-06-29 DIAGNOSIS — L84 Corns and callosities: Secondary | ICD-10-CM | POA: Diagnosis not present

## 2023-06-29 DIAGNOSIS — B351 Tinea unguium: Secondary | ICD-10-CM | POA: Diagnosis not present

## 2023-06-29 DIAGNOSIS — M79676 Pain in unspecified toe(s): Secondary | ICD-10-CM | POA: Diagnosis not present

## 2023-06-29 DIAGNOSIS — E1142 Type 2 diabetes mellitus with diabetic polyneuropathy: Secondary | ICD-10-CM | POA: Diagnosis not present

## 2023-07-21 ENCOUNTER — Other Ambulatory Visit: Payer: Self-pay | Admitting: Internal Medicine

## 2023-07-31 ENCOUNTER — Other Ambulatory Visit: Payer: Self-pay | Admitting: Internal Medicine

## 2023-07-31 DIAGNOSIS — G250 Essential tremor: Secondary | ICD-10-CM

## 2023-08-01 ENCOUNTER — Telehealth: Payer: Self-pay | Admitting: Internal Medicine

## 2023-08-01 ENCOUNTER — Other Ambulatory Visit: Payer: Self-pay

## 2023-08-01 DIAGNOSIS — I10 Essential (primary) hypertension: Secondary | ICD-10-CM

## 2023-08-01 DIAGNOSIS — E782 Mixed hyperlipidemia: Secondary | ICD-10-CM

## 2023-08-01 MED ORDER — ROSUVASTATIN CALCIUM 10 MG PO TABS: 10.0000 mg | ORAL_TABLET | Freq: Every day | ORAL | 1 refills | Status: DC

## 2023-08-01 MED ORDER — AMLODIPINE BESYLATE 5 MG PO TABS
5.0000 mg | ORAL_TABLET | Freq: Every day | ORAL | 3 refills | Status: DC
Start: 2023-08-01 — End: 2024-01-02

## 2023-08-01 NOTE — Telephone Encounter (Signed)
Refills sent to pharmacy. 

## 2023-08-01 NOTE — Telephone Encounter (Signed)
Prescription Request  08/01/2023  LOV: 04/24/2023  What is the name of the medication or equipment? rosuvastatin (CRESTOR) 10 MG tablet [[id number]]   amLODipine (NORVASC) 5 MG tablet [[id number]]   Have you contacted your pharmacy to request a refill? Yes   Which pharmacy would you like this sent to?  [facility] - [place], [place] - [address] [place] [address] Phone: [phone] Fax: [phone]    Patient notified that their request is being sent to the clinical staff for review and that they should receive a response within 2 business days.   Please advise at Mobile [phone] (mobile)

## 2023-08-24 ENCOUNTER — Ambulatory Visit: Payer: 59 | Admitting: Internal Medicine

## 2023-08-29 ENCOUNTER — Ambulatory Visit: Payer: 59 | Admitting: Internal Medicine

## 2023-08-31 ENCOUNTER — Encounter: Payer: Self-pay | Admitting: Internal Medicine

## 2023-08-31 ENCOUNTER — Ambulatory Visit: Payer: 59 | Admitting: Internal Medicine

## 2023-08-31 VITALS — BP 115/70 | HR 70 | Ht 68.0 in | Wt 234.4 lb

## 2023-08-31 DIAGNOSIS — Z23 Encounter for immunization: Secondary | ICD-10-CM

## 2023-08-31 DIAGNOSIS — E782 Mixed hyperlipidemia: Secondary | ICD-10-CM

## 2023-08-31 DIAGNOSIS — I10 Essential (primary) hypertension: Secondary | ICD-10-CM

## 2023-08-31 DIAGNOSIS — Z720 Tobacco use: Secondary | ICD-10-CM | POA: Diagnosis not present

## 2023-08-31 DIAGNOSIS — S42021G Displaced fracture of shaft of right clavicle, subsequent encounter for fracture with delayed healing: Secondary | ICD-10-CM

## 2023-08-31 DIAGNOSIS — R911 Solitary pulmonary nodule: Secondary | ICD-10-CM | POA: Diagnosis not present

## 2023-08-31 DIAGNOSIS — E1143 Type 2 diabetes mellitus with diabetic autonomic (poly)neuropathy: Secondary | ICD-10-CM | POA: Diagnosis not present

## 2023-08-31 DIAGNOSIS — Z794 Long term (current) use of insulin: Secondary | ICD-10-CM | POA: Diagnosis not present

## 2023-08-31 DIAGNOSIS — E1140 Type 2 diabetes mellitus with diabetic neuropathy, unspecified: Secondary | ICD-10-CM | POA: Diagnosis not present

## 2023-08-31 MED ORDER — FREESTYLE LIBRE 3 READER DEVI: 0 refills | Status: AC

## 2023-08-31 MED ORDER — FREESTYLE LIBRE 3 SENSOR MISC: 5 refills | Status: AC

## 2023-08-31 NOTE — Assessment & Plan Note (Signed)
CT chest showed 6 mm right lower lobe pulmonary nodule in 05/24 Check repeat CT chest

## 2023-08-31 NOTE — Assessment & Plan Note (Signed)
Smokes about 2-3 cigarettes per day  Asked about quitting: confirms that he currently smokes cigarettes Advise to quit smoking: Educated about QUITTING to reduce the risk of cancer, cardio and cerebrovascular disease. Assess willingness: Unwilling to quit at this time, but is working on cutting back. Assist with counseling and pharmacotherapy: Counseled for 5 minutes and literature provided. Arrange for follow up: follow up in 3 months and continue to offer help.

## 2023-08-31 NOTE — Assessment & Plan Note (Signed)
On Cymbalta Needs to continue to take gabapentin as well, takes only qHS Used to take Norco PRN, advised patient to avoid taking Norco Added Capsaicin cream  Difficulty getting up from chair/bed, likely due to peripheral neuropathy in addition to hip OA and DDD of lumbar spine

## 2023-08-31 NOTE — Assessment & Plan Note (Signed)
BP Readings from Last 1 Encounters:  08/31/23 115/70   Well-controlled with Amlodipine Counseled for compliance with the medications Advised DASH diet and moderate exercise/walking, at least 150 mins/week

## 2023-08-31 NOTE — Assessment & Plan Note (Signed)
On Crestor Check lipid profile 

## 2023-08-31 NOTE — Assessment & Plan Note (Signed)
BMI Readings from Last 3 Encounters:  08/31/23 35.64 kg/m  04/24/23 35.58 kg/m  02/23/23 35.58 kg/m   Associated with type II DM, HLD, HTN, OA On GLP-1 agonist for type II DM Advised to follow low carb diet and perform moderate exercise as tolerated

## 2023-08-31 NOTE — Assessment & Plan Note (Signed)
Due to injury from a mechanical fall Was followed by orthopedic surgeon Did not tolerate sling - still has pain and a mass near right clavicular area If CT chest does not show the mass, will get US of the mass

## 2023-08-31 NOTE — Patient Instructions (Addendum)
Schedule your Medicare Annual Wellness Visit at checkout.  Please call [phone] to inquire about Cologuard kit.  Please continue to take medications as prescribed.  Please continue to follow low carb diet and perform moderate exercise/walking at least 150 mins/week.  Please get fasting blood tests done before the next visit.

## 2023-08-31 NOTE — Assessment & Plan Note (Addendum)
Lab Results  Component Value Date   HGBA1C 6.4 (H) 04/24/2023   Well-controlled overall On Trulicity and Farxiga Did not tolerate metformin, had GI discomfort  Advised to follow diabetic diet On statin Diabetic eye exam: Advised to follow up with Ophthalmology for diabetic eye exam  On gabapentin and Cymbalta for diabetic neuropathy

## 2023-08-31 NOTE — Progress Notes (Signed)
Established Patient Office Visit  Subjective:  Patient ID: Kevin [name], male    DOB: October 30, 1955  Age: 67 y.o. MRN: [id number]  CC:  Chief Complaint  Patient presents with   Diabetes    Follow up    Hypertension    Follow up     HPI Kevin [name] is a 67 y.o. male with past medical history of DM with neuropathy, DDD of cervical spine and tobacco abuse who presents for f/u of his chronic medical conditions.  Type II DM with neuropathy: He takes [place] and Trulicity for DM.  His HbA1c was 6.4 in 07/24. He has had labile blood glucose with hypoglycemic episodes in the past, up to 50s. He takes gabapentin and duloxetine for diabetic neuropathy. He still c/o neuropathic pain in feet and takes Norco PRN for severe pain, but has noticed improvement recently with Gabapentin.  HTN: BP is well-controlled. Takes medications regularly. Patient denies headache, dizziness, chest pain, dyspnea or palpitations.   He reports improvement in tremors in both hands with propranolol.  He had difficulty writing due to tremors.  Denies any shaking movements of the legs.  His tremors are mostly upon movement.   He has chronic numbness of the foot, which is also likely due to diabetic neuropathy. He had [place] ABI screening, which was unremarkable.  He still reports right clavicular area pain from previous fracture (05/24) due to fall related injury.  He had seen orthopedic surgeon at that time, but could not tolerate the sling.  He reports noticing a knot over the clavicular area.  He is anyways going to get CT chest for pulmonary nodule follow-up, which may show the mass.  Past Medical History:  Diagnosis Date   Arthritis    Chronic knee pain    Depression    Diabetes mellitus (HCC)    Hyperlipidemia    Hypertension    Leg edema    Obesity     Past Surgical History:  Procedure Laterality Date   none      Family History  Problem Relation Age of Onset   Heart disease Unknown     Arthritis Unknown    Diabetes Unknown    Cancer Mother        deceased    Social History   Socioeconomic History   Marital status: Single    Spouse name: Not on file   Number of children: 0   Years of education: Not on file   Highest education level: Not on file  Occupational History   Not on file  Tobacco Use   Smoking status: Every Day    Current packs/day: 0.20    Types: Cigarettes   Smokeless tobacco: Never  Vaping Use   Vaping status: Never Used  Substance and Sexual Activity   Alcohol use: No   Drug use: No   Sexual activity: Not Currently  Other Topics Concern   Not on file  Social History Narrative   Patient's POA is his sister, [name] [phone].           Social Determinants of Health   Financial Resource Strain: Low Risk  (09/16/2022)   Overall Financial Resource Strain (CARDIA)    Difficulty of Paying Living Expenses: Not hard at all  Food Insecurity: No Food Insecurity (02/14/2023)   Hunger Vital Sign    Worried About Running Out of Food in the Last Year: Never true    Ran Out of Food in the Last Year: Never true  Transportation Needs: No Transportation Needs (02/14/2023)   PRAPARE - Administrator, Civil Service (Medical): No    Lack of Transportation (Non-Medical): No  Physical Activity: Insufficiently Active (09/16/2022)   Exercise Vital Sign    Days of Exercise per Week: 2 days    Minutes of Exercise per Session: 20 min  Stress: No Stress Concern Present (09/16/2022)   [employer] of Occupational Health - Occupational Stress Questionnaire    Feeling of Stress : Only a little  Social Connections: Socially Isolated (09/16/2022)   Social Connection and Isolation Panel [NHANES]    Frequency of Communication with Friends and Family: Twice a week    Frequency of Social Gatherings with Friends and Family: Once a week    Attends Religious Services: Never    Database administrator or Organizations: No    Attends Banker  Meetings: Never    Marital Status: Never married  Intimate Partner Violence: Not At Risk (09/16/2022)   Humiliation, Afraid, Rape, and Kick questionnaire    Fear of Current or Ex-Partner: No    Emotionally Abused: No    Physically Abused: No    Sexually Abused: No    Outpatient Medications Prior to Visit  Medication Sig Dispense Refill   amLODipine (NORVASC) 5 MG tablet Take 1 tablet (5 mg total) by mouth daily. 90 tablet 3   aspirin EC 81 MG tablet Take 81 mg by mouth daily.     busPIRone (BUSPAR) 5 MG tablet Take 1 tablet (5 mg total) by mouth 2 (two) times daily. 60 tablet 5   Capsaicin 0.1 % CREA Apply 1 Application topically daily. 60 g 2   Dulaglutide (TRULICITY) 4.5 MG/0.5ML SOPN Inject 4.5 mg as directed once a week. 6 mL 3   DULoxetine (CYMBALTA) 60 MG capsule Take 1 capsule (60 mg total) by mouth daily. 90 capsule 1   FARXIGA 10 MG TABS tablet TAKE 1 TABLET DAILY BEFORE BREAKFAST. 90 tablet 3   gabapentin (NEURONTIN) 400 MG capsule Take 1 capsule (400 mg total) by mouth 3 (three) times daily. 90 capsule 5   Glucose Blood (BLOOD GLUCOSE TEST STRIPS) STRP Use to monitor FSBS 3x daily. Dx: E11.65. (Patient taking differently: 1 each by Other route daily. E11.65.) 100 each 3   ibuprofen (ADVIL) 600 MG tablet Take 1 tablet (600 mg total) by mouth every 8 (eight) hours as needed. 30 tablet 0   lidocaine (LIDODERM) 5 % Place 1 patch onto the skin daily. Remove & Discard patch within 12 hours or as directed by MD 30 patch 0   Misc. Devices MISC Lift chair 1 each 0   nitroGLYCERIN (NITROSTAT) 0.4 MG SL tablet DISSOLVE 1 TABLET UNDER TONGUE EVERY 5 MINUTES UP TO 15 MIN FOR CHEST PAIN. IF NO RELIEF CALL 911. 25 tablet 0   potassium chloride (K-DUR) 10 MEQ tablet Take 1 tablet (10 mEq total) by mouth 2 (two) times daily. Take with fluid pill 180 tablet 3   propranolol (INDERAL) 20 MG tablet TAKE 1 TABLET BY MOUTH TWICE A DAY 60 tablet 3   rosuvastatin (CRESTOR) 10 MG tablet Take 1 tablet (10  mg total) by mouth daily. 90 tablet 1   triamcinolone cream (KENALOG) 0.1 % Apply 1 Application topically 2 (two) times daily. 30 g 0   Continuous Blood Gluc Receiver (DEXCOM G7 RECEIVER) DEVI Please use it to check blood glucose three times before meals, at bedtime and as needed. 1 each 0   Continuous  Blood Gluc Sensor (DEXCOM G7 SENSOR) MISC Please use it to check blood glucose three times before meals, at bedtime and as needed. 3 each 5   No facility-administered medications prior to visit.    Allergies  Allergen Reactions   Lisinopril Cough    ROS Review of Systems  Constitutional:  Negative for chills and fever.  HENT:  Negative for congestion and sore throat.   Eyes:  Negative for pain and discharge.  Respiratory:  Negative for cough and shortness of breath.        Chest wall pain  Cardiovascular:  Negative for palpitations.  Gastrointestinal:  Negative for constipation, diarrhea, nausea and vomiting.  Endocrine: Negative for polydipsia and polyuria.  Genitourinary:  Negative for dysuria and hematuria.  Musculoskeletal:  Positive for arthralgias, back pain and gait problem. Negative for neck pain and neck stiffness.  Skin:  Positive for rash.  Neurological:  Positive for numbness (b/l feet, 5th digits - b/l hands). Negative for dizziness, weakness and headaches.  Psychiatric/Behavioral:  Negative for agitation and behavioral problems.       Objective:    Physical Exam Vitals reviewed.  Constitutional:      General: He is not in acute distress.    Appearance: He is not diaphoretic.     Comments: Has a cane for walking support  HENT:     Head: Normocephalic and atraumatic.     Nose: Nose normal.     Mouth/Throat:     Mouth: Mucous membranes are moist.  Eyes:     General: No scleral icterus.    Extraocular Movements: Extraocular movements intact.  Cardiovascular:     Rate and Rhythm: Normal rate and regular rhythm.     Heart sounds: Normal heart sounds. No murmur  heard. Pulmonary:     Breath sounds: Normal breath sounds. No wheezing or rales.  Chest:     Chest wall: Tenderness (Right-sided) present.  Musculoskeletal:     Cervical back: Neck supple. No tenderness.     Right lower leg: No edema.     Left lower leg: No edema.  Feet:     Right foot:     Toenail Condition: Right toenails are abnormally thick. Fungal disease present.    Left foot:     Toenail Condition: Left toenails are abnormally thick.  Skin:    General: Skin is warm.     Findings: Rash (Maculopapular rash over erythematous base - over b/l upper extremity) present.     Comments: Mass beneath right clavicular area, about 2 cm in diameter  Neurological:     General: No focal deficit present.     Mental Status: He is alert and oriented to person, place, and time.     Motor: Weakness (b/l LE, 4/5) present.     Gait: Gait abnormal.     Comments: Rapid and slurred speech, appears to be at baseline  Psychiatric:        Mood and Affect: Mood normal.        Behavior: Behavior normal.     BP 115/70 (BP Location: Right Arm, Patient Position: Sitting, Cuff Size: Large)   Pulse 70   Ht 5\' 8"  (1.727 m)   Wt 234 lb 6.4 oz (106.3 kg)   SpO2 96%   BMI 35.64 kg/m  Wt Readings from Last 3 Encounters:  08/31/23 234 lb 6.4 oz (106.3 kg)  04/24/23 234 lb (106.1 kg)  02/23/23 234 lb (106.1 kg)    Lab Results  Component Value  Date   TSH 0.906 04/24/2023   Lab Results  Component Value Date   WBC 9.0 04/24/2023   HGB 16.2 04/24/2023   HCT 45.7 04/24/2023   MCV 93 04/24/2023   PLT 185 04/24/2023   Lab Results  Component Value Date   NA 143 04/24/2023   K 4.8 04/24/2023   CO2 23 04/24/2023   GLUCOSE 93 04/24/2023   BUN 11 04/24/2023   CREATININE 0.97 04/24/2023   BILITOT 0.5 04/24/2023   ALKPHOS 98 04/24/2023   AST 15 04/24/2023   ALT 14 04/24/2023   PROT 7.0 04/24/2023   ALBUMIN 4.2 04/24/2023   CALCIUM 9.3 04/24/2023   ANIONGAP 5 02/11/2023   EGFR 87 04/24/2023    GFR 94.60 08/27/2021   Lab Results  Component Value Date   CHOL 112 04/24/2023   Lab Results  Component Value Date   HDL 43 04/24/2023   Lab Results  Component Value Date   LDLCALC 52 04/24/2023   Lab Results  Component Value Date   TRIG 90 04/24/2023   Lab Results  Component Value Date   CHOLHDL 2.6 04/24/2023   Lab Results  Component Value Date   HGBA1C 6.4 (H) 04/24/2023      Assessment & Plan:   Problem List Items Addressed This Visit       Cardiovascular and Mediastinum   Essential hypertension, benign    BP Readings from Last 1 Encounters:  08/31/23 115/70   Well-controlled with Amlodipine Counseled for compliance with the medications Advised DASH diet and moderate exercise/walking, at least 150 mins/week        Respiratory   Pulmonary nodule    CT chest showed 6 mm right lower lobe pulmonary nodule in 05/24 Check repeat CT chest      Relevant Orders   CT Chest Wo Contrast     Endocrine   Diabetes mellitus with neurological manifestation (HCC) - Primary    Lab Results  Component Value Date   HGBA1C 6.4 (H) 04/24/2023   Well-controlled overall On Trulicity and Farxiga Did not tolerate metformin, had GI discomfort  Advised to follow diabetic diet On statin Diabetic eye exam: Advised to follow up with Ophthalmology for diabetic eye exam  On gabapentin and Cymbalta for diabetic neuropathy      Relevant Medications   Continuous Glucose Sensor (FREESTYLE LIBRE 3 SENSOR) MISC   Continuous Glucose Receiver (FREESTYLE LIBRE 3 READER) DEVI   Other Relevant Orders   Bayer DCA Hb A1c Waived   CMP14+EGFR   Hemoglobin A1c   Urine Microalbumin w/creat. ratio   Neuropathy, diabetic (HCC)    On Cymbalta Needs to continue to take gabapentin as well, takes only qHS Used to take Norco PRN, advised patient to avoid taking Norco Added Capsaicin cream  Difficulty getting up from chair/bed, likely due to peripheral neuropathy in addition to hip OA and  DDD of lumbar spine        Musculoskeletal and Integument   Closed displaced fracture of shaft of right clavicle    Due to injury from a mechanical fall Was followed by orthopedic surgeon Did not tolerate sling - still has pain and a mass near right clavicular area If CT chest does not show the mass, will get US of the mass        Other   Hyperlipidemia    On Crestor Check lipid profile      Relevant Orders   Lipid Profile   Morbid obesity (HCC)    BMI  Readings from Last 3 Encounters:  08/31/23 35.64 kg/m  04/24/23 35.58 kg/m  02/23/23 35.58 kg/m   Associated with type II DM, HLD, HTN, OA On GLP-1 agonist for type II DM Advised to follow low carb diet and perform moderate exercise as tolerated      Tobacco abuse    Smokes about 2-3 cigarettes per day  Asked about quitting: confirms that he currently smokes cigarettes Advise to quit smoking: Educated about QUITTING to reduce the risk of cancer, cardio and cerebrovascular disease. Assess willingness: Unwilling to quit at this time, but is working on cutting back. Assist with counseling and pharmacotherapy: Counseled for 5 minutes and literature provided. Arrange for follow up: follow up in 3 months and continue to offer help.      Other Visit Diagnoses     Encounter for immunization       Relevant Orders   Flu Vaccine Trivalent High Dose (Fluad) (Completed)      Meds ordered this encounter  Medications   Continuous Glucose Sensor (FREESTYLE LIBRE 3 SENSOR) MISC    Sig: Use it to check blood glucose as instructed.    Dispense:  2 each    Refill:  5   Continuous Glucose Receiver (FREESTYLE LIBRE 3 READER) DEVI    Sig: Use it to check blood glucose as instructed.    Dispense:  1 each    Refill:  0    Follow-up: Return in about 4 months (around 12/29/2023) for DM and HTN.    Anabel Halon, MD

## 2023-09-04 ENCOUNTER — Ambulatory Visit (HOSPITAL_COMMUNITY): Payer: 59

## 2023-09-04 LAB — BAYER DCA HB A1C WAIVED: HB A1C (BAYER DCA - WAIVED): 6.2 % — ABNORMAL HIGH (ref 4.8–5.6)

## 2023-09-11 ENCOUNTER — Ambulatory Visit (HOSPITAL_COMMUNITY): Admission: RE | Admit: 2023-09-11 | Payer: 59 | Source: Ambulatory Visit

## 2023-09-11 ENCOUNTER — Other Ambulatory Visit: Payer: Self-pay

## 2023-09-11 ENCOUNTER — Telehealth: Payer: Self-pay

## 2023-09-11 DIAGNOSIS — G250 Essential tremor: Secondary | ICD-10-CM

## 2023-09-11 MED ORDER — PROPRANOLOL HCL 20 MG PO TABS: 20.0000 mg | ORAL_TABLET | Freq: Two times a day (BID) | ORAL | 3 refills | Status: DC

## 2023-09-11 NOTE — Telephone Encounter (Signed)
Refill sent to pharmacy.   

## 2023-09-11 NOTE — Telephone Encounter (Signed)
Copied from CRM [id number]. Topic: Clinical - Medication Refill >> Sep 11, 2023 11:01 AM Amy B wrote: Most Recent Primary Care Visit:  Provider: Anabel Halon  Department: [facility]  Visit Type: OFFICE VISIT  Date: 08/31/2023  Medication: propranolol (INDERAL) 20 MG tablet  Has the patient contacted their pharmacy? No (Agent: If no, request that the patient contact the pharmacy for the refill. If patient does not wish to contact the pharmacy document the reason why and proceed with request.) (Agent: If yes, when and what did the pharmacy advise?)  Is this the correct pharmacy for this prescription? Yes If no, delete pharmacy and type the correct one.  This is the patient's preferred pharmacy:  [facility] - [place], [place] - [address] [place] [address] Phone: [phone] Fax: [phone]   Has the prescription been filled recently? No  Is the patient out of the medication? Yes  Has the patient been seen for an appointment in the last year OR does the patient have an upcoming appointment? Yes  Can we respond through MyChart? No  Agent: Please be advised that Rx refills may take up to 3 business days. We ask that you follow-up with your pharmacy.

## 2023-09-14 DIAGNOSIS — B351 Tinea unguium: Secondary | ICD-10-CM | POA: Diagnosis not present

## 2023-09-14 DIAGNOSIS — L84 Corns and callosities: Secondary | ICD-10-CM | POA: Diagnosis not present

## 2023-09-14 DIAGNOSIS — M79676 Pain in unspecified toe(s): Secondary | ICD-10-CM | POA: Diagnosis not present

## 2023-09-14 DIAGNOSIS — E1142 Type 2 diabetes mellitus with diabetic polyneuropathy: Secondary | ICD-10-CM | POA: Diagnosis not present

## 2023-11-23 DIAGNOSIS — E1142 Type 2 diabetes mellitus with diabetic polyneuropathy: Secondary | ICD-10-CM | POA: Diagnosis not present

## 2023-11-23 DIAGNOSIS — M79676 Pain in unspecified toe(s): Secondary | ICD-10-CM | POA: Diagnosis not present

## 2023-11-23 DIAGNOSIS — L84 Corns and callosities: Secondary | ICD-10-CM | POA: Diagnosis not present

## 2023-11-23 DIAGNOSIS — B351 Tinea unguium: Secondary | ICD-10-CM | POA: Diagnosis not present

## 2023-12-19 ENCOUNTER — Telehealth: Payer: Self-pay | Admitting: Internal Medicine

## 2023-12-19 ENCOUNTER — Other Ambulatory Visit: Payer: Self-pay | Admitting: Internal Medicine

## 2023-12-19 MED ORDER — NITROGLYCERIN 0.4 MG SL SUBL
SUBLINGUAL_TABLET | SUBLINGUAL | 0 refills | Status: AC
Start: 2023-12-19 — End: ?

## 2023-12-19 NOTE — Telephone Encounter (Signed)
 Copied from CRM [id number]. Topic: Clinical - Medication Refill >> Dec 19, 2023  2:15 PM Turkey B wrote: Most Recent Primary Care Visit:  Provider: Anabel Halon  Department: [facility]  Visit Type: OFFICE VISIT  Date: 08/31/2023  Medication: nitroGLYCERIN (NITROSTAT) 0.4 MG SL tablet  Has the patient contacted their pharmacy? no Pt thought they had to call in  Is this the correct pharmacy for this prescription? Yes  [facility] - [place], [place] - [address] [place] [address] Phone: [phone] Fax: [phone]   Has the prescription been filled recently? no  Is the patient out of the medication? yes  Has the patient been seen for an appointment in the last year OR does the patient have an upcoming appointment? yes  Can we respond through MyChart? no  Agent: Please be advised that Rx refills may take up to 3 business days. We ask that you follow-up with your pharmacy.

## 2023-12-19 NOTE — Telephone Encounter (Signed)
 Prescription Request  12/19/2023  LOV: 08/31/2023  What is the name of the medication or equipment? nitroGLYCERIN (NITROSTAT) 0.4 MG SL tablet [[id number]]   Have you contacted your pharmacy to request a refill? Yes   Which pharmacy would you like this sent to?  [facility] - [place], [place] - [address] [place] [address] Phone: [phone] Fax: [phone]    Patient notified that their request is being sent to the clinical staff for review and that they should receive a response within 2 business days.   Please advise at walked into office

## 2023-12-20 NOTE — Telephone Encounter (Signed)
 Sent on 12/19/23

## 2024-01-01 ENCOUNTER — Ambulatory Visit: Payer: 59 | Admitting: Internal Medicine

## 2024-01-01 ENCOUNTER — Encounter: Payer: Self-pay | Admitting: Internal Medicine

## 2024-01-01 VITALS — BP 118/68 | HR 70 | Ht 68.0 in | Wt 243.0 lb

## 2024-01-01 DIAGNOSIS — Z794 Long term (current) use of insulin: Secondary | ICD-10-CM

## 2024-01-01 DIAGNOSIS — Z1211 Encounter for screening for malignant neoplasm of colon: Secondary | ICD-10-CM | POA: Diagnosis not present

## 2024-01-01 DIAGNOSIS — E1140 Type 2 diabetes mellitus with diabetic neuropathy, unspecified: Secondary | ICD-10-CM | POA: Diagnosis not present

## 2024-01-01 DIAGNOSIS — I10 Essential (primary) hypertension: Secondary | ICD-10-CM

## 2024-01-01 DIAGNOSIS — G250 Essential tremor: Secondary | ICD-10-CM

## 2024-01-01 DIAGNOSIS — E0843 Diabetes mellitus due to underlying condition with diabetic autonomic (poly)neuropathy: Secondary | ICD-10-CM

## 2024-01-01 DIAGNOSIS — F419 Anxiety disorder, unspecified: Secondary | ICD-10-CM | POA: Diagnosis not present

## 2024-01-01 MED ORDER — DULOXETINE HCL 60 MG PO CPEP: 60.0000 mg | ORAL_CAPSULE | Freq: Every day | ORAL | 1 refills | Status: DC

## 2024-01-01 MED ORDER — GABAPENTIN 400 MG PO CAPS: 400.0000 mg | ORAL_CAPSULE | Freq: Three times a day (TID) | ORAL | 5 refills | Status: DC

## 2024-01-01 MED ORDER — BUSPIRONE HCL 5 MG PO TABS: 5.0000 mg | ORAL_TABLET | Freq: Two times a day (BID) | ORAL | 5 refills | Status: DC

## 2024-01-01 NOTE — Assessment & Plan Note (Signed)
Well controlled with propranolol Continue gabapentin for diabetic neuropathy He has denied neurology referral in the past

## 2024-01-01 NOTE — Progress Notes (Signed)
 Established Patient Office Visit  Subjective:  Patient ID: Kevin [name], male    DOB: 23-May-1956  Age: 68 y.o. MRN: [id number]  CC:  Chief Complaint  Patient presents with   Care Management    4 month f/u    HPI Kevin [name] is a 68 y.o. male with past medical history of DM with neuropathy, DDD of cervical spine and tobacco abuse who presents for f/u of his chronic medical conditions.  Type II DM with neuropathy: He takes [place] and Trulicity for DM.  His HbA1c was 6.2 in 11/24. He has had labile blood glucose with hypoglycemic episodes in the past, up to 50s.  He was given CGM, but has not started using it yet. He takes gabapentin and duloxetine for diabetic neuropathy. He still c/o neuropathic pain in feet and takes Norco PRN for severe pain, but has noticed improvement recently with Gabapentin.  HTN: BP is well-controlled. Takes medications regularly. Patient denies headache, dizziness, chest pain, dyspnea or palpitations.   He reports improvement in tremors in both hands with propranolol.  He had difficulty writing due to tremors.  Denies any shaking movements of the legs.  His tremors are mostly upon movement.    Past Medical History:  Diagnosis Date   Arthritis    Chronic knee pain    Depression    Diabetes mellitus (HCC)    Hyperlipidemia    Hypertension    Leg edema    Obesity     Past Surgical History:  Procedure Laterality Date   none      Family History  Problem Relation Age of Onset   Heart disease Unknown    Arthritis Unknown    Diabetes Unknown    Cancer Mother        deceased    Social History   Socioeconomic History   Marital status: Single    Spouse name: Not on file   Number of children: 0   Years of education: Not on file   Highest education level: Not on file  Occupational History   Not on file  Tobacco Use   Smoking status: Every Day    Current packs/day: 0.20    Types: Cigarettes   Smokeless tobacco: Never  Vaping Use    Vaping status: Never Used  Substance and Sexual Activity   Alcohol use: No   Drug use: No   Sexual activity: Not Currently  Other Topics Concern   Not on file  Social History Narrative   Patient's POA is his sister, [name] [phone].           Social Drivers of Corporate investment banker Strain: Low Risk  (09/16/2022)   Overall Financial Resource Strain (CARDIA)    Difficulty of Paying Living Expenses: Not hard at all  Food Insecurity: No Food Insecurity (02/14/2023)   Hunger Vital Sign    Worried About Running Out of Food in the Last Year: Never true    Ran Out of Food in the Last Year: Never true  Transportation Needs: No Transportation Needs (02/14/2023)   PRAPARE - Administrator, Civil Service (Medical): No    Lack of Transportation (Non-Medical): No  Physical Activity: Insufficiently Active (09/16/2022)   Exercise Vital Sign    Days of Exercise per Week: 2 days    Minutes of Exercise per Session: 20 min  Stress: No Stress Concern Present (09/16/2022)   [employer] of Occupational Health - Occupational Stress Questionnaire  Feeling of Stress : Only a little  Social Connections: Socially Isolated (09/16/2022)   Social Connection and Isolation Panel [NHANES]    Frequency of Communication with Friends and Family: Twice a week    Frequency of Social Gatherings with Friends and Family: Once a week    Attends Religious Services: Never    Database administrator or Organizations: No    Attends Banker Meetings: Never    Marital Status: Never married  Intimate Partner Violence: Not At Risk (09/16/2022)   Humiliation, Afraid, Rape, and Kick questionnaire    Fear of Current or Ex-Partner: No    Emotionally Abused: No    Physically Abused: No    Sexually Abused: No    Outpatient Medications Prior to Visit  Medication Sig Dispense Refill   amLODipine (NORVASC) 5 MG tablet Take 1 tablet (5 mg total) by mouth daily. 90 tablet 3   aspirin EC 81  MG tablet Take 81 mg by mouth daily.     Capsaicin 0.1 % CREA Apply 1 Application topically daily. 60 g 2   Continuous Glucose Receiver (FREESTYLE LIBRE 3 READER) DEVI Use it to check blood glucose as instructed. 1 each 0   Continuous Glucose Sensor (FREESTYLE LIBRE 3 SENSOR) MISC Use it to check blood glucose as instructed. 2 each 5   Dulaglutide (TRULICITY) 4.5 MG/0.5ML SOPN Inject 4.5 mg as directed once a week. 6 mL 3   FARXIGA 10 MG TABS tablet TAKE 1 TABLET DAILY BEFORE BREAKFAST. 90 tablet 3   Glucose Blood (BLOOD GLUCOSE TEST STRIPS) STRP Use to monitor FSBS 3x daily. Dx: E11.65. (Patient taking differently: 1 each by Other route daily. E11.65.) 100 each 3   ibuprofen (ADVIL) 600 MG tablet Take 1 tablet (600 mg total) by mouth every 8 (eight) hours as needed. 30 tablet 0   lidocaine (LIDODERM) 5 % Place 1 patch onto the skin daily. Remove & Discard patch within 12 hours or as directed by MD 30 patch 0   Misc. Devices MISC Lift chair 1 each 0   nitroGLYCERIN (NITROSTAT) 0.4 MG SL tablet DISSOLVE 1 TABLET UNDER TONGUE EVERY 5 MINUTES UP TO 15 MIN FOR CHEST PAIN. IF NO RELIEF CALL 911. 25 tablet 0   propranolol (INDERAL) 20 MG tablet Take 1 tablet (20 mg total) by mouth 2 (two) times daily. 60 tablet 3   rosuvastatin (CRESTOR) 10 MG tablet Take 1 tablet (10 mg total) by mouth daily. 90 tablet 1   triamcinolone cream (KENALOG) 0.1 % Apply 1 Application topically 2 (two) times daily. 30 g 0   busPIRone (BUSPAR) 5 MG tablet Take 1 tablet (5 mg total) by mouth 2 (two) times daily. 60 tablet 5   DULoxetine (CYMBALTA) 60 MG capsule Take 1 capsule (60 mg total) by mouth daily. 90 capsule 1   gabapentin (NEURONTIN) 400 MG capsule Take 1 capsule (400 mg total) by mouth 3 (three) times daily. 90 capsule 5   potassium chloride (K-DUR) 10 MEQ tablet Take 1 tablet (10 mEq total) by mouth 2 (two) times daily. Take with fluid pill 180 tablet 3   No facility-administered medications prior to visit.     Allergies  Allergen Reactions   Lisinopril Cough    ROS Review of Systems  Constitutional:  Negative for chills and fever.  HENT:  Negative for congestion and sore throat.   Eyes:  Negative for pain and discharge.  Respiratory:  Negative for cough and shortness of breath.  Chest wall pain  Cardiovascular:  Negative for palpitations.  Gastrointestinal:  Negative for constipation, diarrhea, nausea and vomiting.  Endocrine: Negative for polydipsia and polyuria.  Genitourinary:  Negative for dysuria and hematuria.  Musculoskeletal:  Positive for arthralgias, back pain and gait problem. Negative for neck pain and neck stiffness.  Skin:  Positive for rash.  Neurological:  Positive for numbness (b/l feet, 5th digits - b/l hands). Negative for dizziness, weakness and headaches.  Psychiatric/Behavioral:  Negative for agitation and behavioral problems.       Objective:    Physical Exam Vitals reviewed.  Constitutional:      General: He is not in acute distress.    Appearance: He is not diaphoretic.     Comments: Has a cane for walking support  HENT:     Head: Normocephalic and atraumatic.     Nose: Nose normal.     Mouth/Throat:     Mouth: Mucous membranes are moist.  Eyes:     General: No scleral icterus.    Extraocular Movements: Extraocular movements intact.  Cardiovascular:     Rate and Rhythm: Normal rate and regular rhythm.     Heart sounds: Normal heart sounds. No murmur heard. Pulmonary:     Breath sounds: Normal breath sounds. No wheezing or rales.  Musculoskeletal:     Cervical back: Neck supple. No tenderness.     Right lower leg: No edema.     Left lower leg: No edema.  Feet:     Right foot:     Toenail Condition: Right toenails are abnormally thick. Fungal disease present.    Left foot:     Toenail Condition: Left toenails are abnormally thick.  Skin:    General: Skin is warm.     Findings: Rash (Maculopapular rash over erythematous base - over  b/l upper extremity) present.     Comments: Mass beneath right clavicular area, about 2 cm in diameter  Neurological:     General: No focal deficit present.     Mental Status: He is alert and oriented to person, place, and time.     Motor: Weakness (b/l LE, 4/5) present.     Gait: Gait abnormal.     Comments: Rapid and slurred speech, appears to be at baseline  Psychiatric:        Mood and Affect: Mood normal.        Behavior: Behavior normal.     BP 118/68   Pulse 70   Ht 5\' 8"  (1.727 m)   Wt 243 lb (110.2 kg)   SpO2 95%   BMI 36.95 kg/m  Wt Readings from Last 3 Encounters:  01/01/24 243 lb (110.2 kg)  08/31/23 234 lb 6.4 oz (106.3 kg)  04/24/23 234 lb (106.1 kg)    Lab Results  Component Value Date   TSH 0.906 04/24/2023   Lab Results  Component Value Date   WBC 9.0 04/24/2023   HGB 16.2 04/24/2023   HCT 45.7 04/24/2023   MCV 93 04/24/2023   PLT 185 04/24/2023   Lab Results  Component Value Date   NA 143 04/24/2023   K 4.8 04/24/2023   CO2 23 04/24/2023   GLUCOSE 93 04/24/2023   BUN 11 04/24/2023   CREATININE 0.97 04/24/2023   BILITOT 0.5 04/24/2023   ALKPHOS 98 04/24/2023   AST 15 04/24/2023   ALT 14 04/24/2023   PROT 7.0 04/24/2023   ALBUMIN 4.2 04/24/2023   CALCIUM 9.3 04/24/2023   ANIONGAP 5 02/11/2023  EGFR 87 04/24/2023   GFR 94.60 08/27/2021   Lab Results  Component Value Date   CHOL 112 04/24/2023   Lab Results  Component Value Date   HDL 43 04/24/2023   Lab Results  Component Value Date   LDLCALC 52 04/24/2023   Lab Results  Component Value Date   TRIG 90 04/24/2023   Lab Results  Component Value Date   CHOLHDL 2.6 04/24/2023   Lab Results  Component Value Date   HGBA1C 6.2 (H) 08/31/2023      Assessment & Plan:   Problem List Items Addressed This Visit       Cardiovascular and Mediastinum   Essential hypertension, benign - Primary   BP Readings from Last 1 Encounters:  01/01/24 118/68   Well-controlled with  Amlodipine Counseled for compliance with the medications Advised DASH diet and moderate exercise/walking, at least 150 mins/week        Endocrine   Diabetes mellitus with neurological manifestation (HCC)   Lab Results  Component Value Date   HGBA1C 6.2 (H) 08/31/2023   Well-controlled overall On Trulicity and Farxiga Did not tolerate metformin, had GI discomfort  Advised to follow diabetic diet On statin Diabetic eye exam: Advised to follow up with Ophthalmology for diabetic eye exam  On gabapentin and Cymbalta for diabetic neuropathy      Relevant Orders   CMP14+EGFR   Hemoglobin A1c   Ambulatory referral to Optometry   Neuropathy, diabetic (HCC)   On Cymbalta Needs to continue to take gabapentin as well, takes only qHS Used to take Norco PRN, advised patient to avoid taking Norco Had given Capsaicin cream as well  Difficulty getting up from chair/bed, likely due to peripheral neuropathy in addition to hip OA and DDD of lumbar spine      Relevant Medications   gabapentin (NEURONTIN) 400 MG capsule   DULoxetine (CYMBALTA) 60 MG capsule     Nervous and Auditory   Essential tremor   Well controlled with propranolol Continue gabapentin for diabetic neuropathy He has denied neurology referral in the past        Other   Anxiety   Better controlled Currently on Cymbalta for GAD and neuropathy, needs  to be compliant On BuSpar 5 mg BID now He needed a safe place to live, which is a social aspect - has been followed by CCM team - he has a new place to live now.      Relevant Medications   DULoxetine (CYMBALTA) 60 MG capsule   busPIRone (BUSPAR) 5 MG tablet   Other Visit Diagnoses       Colon cancer screening       Relevant Orders   Cologuard       Meds ordered this encounter  Medications   gabapentin (NEURONTIN) 400 MG capsule    Sig: Take 1 capsule (400 mg total) by mouth 3 (three) times daily.    Dispense:  90 capsule    Refill:  5   DULoxetine  (CYMBALTA) 60 MG capsule    Sig: Take 1 capsule (60 mg total) by mouth daily.    Dispense:  90 capsule    Refill:  1   busPIRone (BUSPAR) 5 MG tablet    Sig: Take 1 tablet (5 mg total) by mouth 2 (two) times daily.    Dispense:  60 tablet    Refill:  5    Follow-up: Return in about 4 months (around 05/02/2024) for Annual physical.    Anabel Halon,  MD

## 2024-01-01 NOTE — Assessment & Plan Note (Signed)
 Lab Results  Component Value Date   HGBA1C 6.2 (H) 08/31/2023   Well-controlled overall On Trulicity and Farxiga Did not tolerate metformin, had GI discomfort  Advised to follow diabetic diet On statin Diabetic eye exam: Advised to follow up with Ophthalmology for diabetic eye exam  On gabapentin and Cymbalta for diabetic neuropathy

## 2024-01-01 NOTE — Assessment & Plan Note (Addendum)
 Better controlled Currently on Cymbalta for GAD and neuropathy, needs  to be compliant On BuSpar 5 mg BID now He needed a safe place to live, which is a social aspect - has been followed by CCM team - he has a new place to live now.

## 2024-01-01 NOTE — Assessment & Plan Note (Signed)
 On Cymbalta Needs to continue to take gabapentin as well, takes only qHS Used to take Norco PRN, advised patient to avoid taking Norco Had given Capsaicin cream as well  Difficulty getting up from chair/bed, likely due to peripheral neuropathy in addition to hip OA and DDD of lumbar spine

## 2024-01-01 NOTE — Patient Instructions (Addendum)
 Please schedule Medicare Annual Wellness.  Please start taking Gabapentin and Duloxetine for neuropathy.  Please continue taking Buspirone for anxiety.  Please continue to take medications as prescribed.  Please continue to follow low carb diet and ambulate as tolerated.  Please contact Eye clinic for eye evaluation. [facility] [address], [place], [place] [address] [phone]

## 2024-01-01 NOTE — Assessment & Plan Note (Signed)
 BP Readings from Last 1 Encounters:  01/01/24 118/68   Well-controlled with Amlodipine Counseled for compliance with the medications Advised DASH diet and moderate exercise/walking, at least 150 mins/week

## 2024-01-02 ENCOUNTER — Other Ambulatory Visit: Payer: Self-pay | Admitting: Internal Medicine

## 2024-01-02 DIAGNOSIS — I10 Essential (primary) hypertension: Secondary | ICD-10-CM

## 2024-01-02 LAB — CMP14+EGFR
ALT: 16 IU/L (ref 0–44)
AST: 17 IU/L (ref 0–40)
Albumin: 4.4 g/dL (ref 3.9–4.9)
Alkaline Phosphatase: 82 IU/L (ref 44–121)
BUN/Creatinine Ratio: 9 — ABNORMAL LOW (ref 10–24)
BUN: 8 mg/dL (ref 8–27)
Bilirubin Total: 0.5 mg/dL (ref 0.0–1.2)
CO2: 26 mmol/L (ref 20–29)
Calcium: 9.5 mg/dL (ref 8.6–10.2)
Chloride: 102 mmol/L (ref 96–106)
Creatinine, Ser: 0.92 mg/dL (ref 0.76–1.27)
Globulin, Total: 2.6 g/dL (ref 1.5–4.5)
Glucose: 112 mg/dL — ABNORMAL HIGH (ref 70–99)
Potassium: 5 mmol/L (ref 3.5–5.2)
Sodium: 141 mmol/L (ref 134–144)
Total Protein: 7 g/dL (ref 6.0–8.5)
eGFR: 92 mL/min/{1.73_m2} (ref >59)

## 2024-01-02 LAB — HEMOGLOBIN A1C
Est. average glucose Bld gHb Est-mCnc: 143 mg/dL
Hgb A1c MFr Bld: 6.6 % — ABNORMAL HIGH (ref 4.8–5.6)

## 2024-01-02 MED ORDER — AMLODIPINE BESYLATE 5 MG PO TABS: 5.0000 mg | ORAL_TABLET | Freq: Every day | ORAL | 3 refills | Status: DC

## 2024-01-02 NOTE — Telephone Encounter (Signed)
 Copied from CRM [id number]. Topic: Clinical - Medication Refill >> Jan 02, 2024  2:07 PM Patsy Lager T wrote: Most Recent Primary Care Visit:  Provider: Anabel Halon  Department: [facility]  Visit Type: OFFICE VISIT  Date: 01/01/2024  Medication: amLODipine (NORVASC) 5 MG tablet  Has the patient contacted their pharmacy? Yes  Is this the correct pharmacy for this prescription? No  This is the patient's preferred pharmacy:  [facility] - [place], [place] - [address] [place] [address] Phone: [phone] Fax: [phone]   Has the prescription been filled recently? Yes  Is the patient out of the medication? Yes  Has the patient been seen for an appointment in the last year OR does the patient have an upcoming appointment? Yes  Can we respond through MyChart? No  Agent: Please be advised that Rx refills may take up to 3 business days. We ask that you follow-up with your pharmacy.

## 2024-01-19 DIAGNOSIS — Z1211 Encounter for screening for malignant neoplasm of colon: Secondary | ICD-10-CM | POA: Diagnosis not present

## 2024-01-19 LAB — COLOGUARD: Cologuard: NEGATIVE

## 2024-01-22 ENCOUNTER — Other Ambulatory Visit: Payer: Self-pay | Admitting: Internal Medicine

## 2024-01-22 DIAGNOSIS — E782 Mixed hyperlipidemia: Secondary | ICD-10-CM

## 2024-01-25 LAB — COLOGUARD: COLOGUARD: NEGATIVE

## 2024-02-01 DIAGNOSIS — M79675 Pain in left toe(s): Secondary | ICD-10-CM | POA: Diagnosis not present

## 2024-02-01 DIAGNOSIS — M79674 Pain in right toe(s): Secondary | ICD-10-CM | POA: Diagnosis not present

## 2024-02-01 DIAGNOSIS — B351 Tinea unguium: Secondary | ICD-10-CM | POA: Diagnosis not present

## 2024-02-01 DIAGNOSIS — L84 Corns and callosities: Secondary | ICD-10-CM | POA: Diagnosis not present

## 2024-02-01 DIAGNOSIS — E1142 Type 2 diabetes mellitus with diabetic polyneuropathy: Secondary | ICD-10-CM | POA: Diagnosis not present

## 2024-02-20 LAB — HM DIABETES EYE EXAM

## 2024-02-22 ENCOUNTER — Encounter: Payer: Self-pay | Admitting: Internal Medicine

## 2024-04-09 ENCOUNTER — Other Ambulatory Visit: Payer: Self-pay | Admitting: Internal Medicine

## 2024-04-09 DIAGNOSIS — G250 Essential tremor: Secondary | ICD-10-CM

## 2024-04-18 DIAGNOSIS — L84 Corns and callosities: Secondary | ICD-10-CM | POA: Diagnosis not present

## 2024-04-18 DIAGNOSIS — M79675 Pain in left toe(s): Secondary | ICD-10-CM | POA: Diagnosis not present

## 2024-04-18 DIAGNOSIS — M79674 Pain in right toe(s): Secondary | ICD-10-CM | POA: Diagnosis not present

## 2024-04-18 DIAGNOSIS — E1142 Type 2 diabetes mellitus with diabetic polyneuropathy: Secondary | ICD-10-CM | POA: Diagnosis not present

## 2024-04-18 DIAGNOSIS — B351 Tinea unguium: Secondary | ICD-10-CM | POA: Diagnosis not present

## 2024-05-02 ENCOUNTER — Encounter: Payer: Self-pay | Admitting: Internal Medicine

## 2024-05-02 ENCOUNTER — Ambulatory Visit: Admitting: Internal Medicine

## 2024-05-02 VITALS — BP 110/69 | HR 67 | Ht 68.0 in | Wt 236.0 lb

## 2024-05-02 DIAGNOSIS — Z125 Encounter for screening for malignant neoplasm of prostate: Secondary | ICD-10-CM

## 2024-05-02 DIAGNOSIS — E1140 Type 2 diabetes mellitus with diabetic neuropathy, unspecified: Secondary | ICD-10-CM | POA: Diagnosis not present

## 2024-05-02 DIAGNOSIS — R5381 Other malaise: Secondary | ICD-10-CM | POA: Diagnosis not present

## 2024-05-02 DIAGNOSIS — Z0001 Encounter for general adult medical examination with abnormal findings: Secondary | ICD-10-CM

## 2024-05-02 DIAGNOSIS — I10 Essential (primary) hypertension: Secondary | ICD-10-CM | POA: Diagnosis not present

## 2024-05-02 DIAGNOSIS — E559 Vitamin D deficiency, unspecified: Secondary | ICD-10-CM | POA: Diagnosis not present

## 2024-05-02 DIAGNOSIS — Z794 Long term (current) use of insulin: Secondary | ICD-10-CM

## 2024-05-02 DIAGNOSIS — Z7984 Long term (current) use of oral hypoglycemic drugs: Secondary | ICD-10-CM

## 2024-05-02 DIAGNOSIS — F419 Anxiety disorder, unspecified: Secondary | ICD-10-CM

## 2024-05-02 DIAGNOSIS — E782 Mixed hyperlipidemia: Secondary | ICD-10-CM | POA: Diagnosis not present

## 2024-05-02 DIAGNOSIS — E0843 Diabetes mellitus due to underlying condition with diabetic autonomic (poly)neuropathy: Secondary | ICD-10-CM

## 2024-05-02 MED ORDER — DULOXETINE HCL 60 MG PO CPEP: 60.0000 mg | ORAL_CAPSULE | Freq: Every day | ORAL | 1 refills | Status: AC

## 2024-05-02 MED ORDER — AMLODIPINE BESYLATE 5 MG PO TABS: 5.0000 mg | ORAL_TABLET | Freq: Every day | ORAL | 3 refills | Status: DC

## 2024-05-02 MED ORDER — TRULICITY 4.5 MG/0.5ML ~~LOC~~ SOAJ: 4.5000 mg | SUBCUTANEOUS | 3 refills | Status: AC

## 2024-05-02 NOTE — Assessment & Plan Note (Signed)
 Uncontrolled recently due to his brother's arrest Currently on Cymbalta  for GAD and neuropathy, needs  to be compliant On BuSpar  5 mg BID now

## 2024-05-02 NOTE — Assessment & Plan Note (Signed)
 Physical exam as documented. Counseling done  re healthy lifestyle involving commitment to 150 minutes exercise per week, heart healthy diet, and attaining healthy weight.The importance of adequate sleep also discussed. Immunization and cancer screening needs are specifically addressed at this visit.

## 2024-05-02 NOTE — Progress Notes (Signed)
 Established Patient Office Visit  Subjective:  Patient ID: Kevin [name], male    DOB: 1955-10-21  Age: 68 y.o. MRN: [id number]  CC:  Chief Complaint  Patient presents with   Annual Exam    HPI Kevin [name] is a 68 y.o. male with past medical history of DM with neuropathy, DDD of cervical spine and tobacco abuse who presents for annual physical.  Type II DM with neuropathy: He takes Farxiga  and Trulicity  for DM.  His HbA1c was 6.6 in 03/25. He has had labile blood glucose with hypoglycemic episodes in the past, up to 50s.  He was given CGM, but has not started using it yet. He takes gabapentin  and duloxetine  for diabetic neuropathy. He still c/o neuropathic pain in feet and takes Norco PRN for severe pain, but has noticed improvement recently with Gabapentin . He was given Gabapentin  600 mg by Podiatrist, but had drowsiness with it. He prefers to continue 400 mg TID dose.  HTN: BP is well-controlled. Takes medications regularly. Patient denies headache, dizziness, chest pain, dyspnea or palpitations.   He reports improvement in tremors in both hands with propranolol .  He had difficulty writing due to tremors.  Denies any shaking movements of the legs.  His tremors are mostly upon movement.  GAD: He has been worried about his brother, who was placed in prison in the last week.  He currently takes buspirone , but is noncompliant.  He agrees to take buspirone  5 mg twice daily.    Past Medical History:  Diagnosis Date   Arthritis    Chronic knee pain    Depression    Diabetes mellitus (HCC)    Hyperlipidemia    Hypertension    Leg edema    Obesity     Past Surgical History:  Procedure Laterality Date   none      Family History  Problem Relation Age of Onset   Heart disease Unknown    Arthritis Unknown    Diabetes Unknown    Cancer Mother        deceased    Social History   Socioeconomic History   Marital status: Single    Spouse name: Not on file   Number  of children: 0   Years of education: Not on file   Highest education level: Not on file  Occupational History   Not on file  Tobacco Use   Smoking status: Every Day    Current packs/day: 0.20    Types: Cigarettes   Smokeless tobacco: Never  Vaping Use   Vaping status: Never Used  Substance and Sexual Activity   Alcohol use: No   Drug use: No   Sexual activity: Not Currently  Other Topics Concern   Not on file  Social History Narrative   Patient's POA is his sister, Dickey Sharps [phone].           Social Drivers of Corporate investment banker Strain: Low Risk  (09/16/2022)   Overall Financial Resource Strain (CARDIA)    Difficulty of Paying Living Expenses: Not hard at all  Food Insecurity: No Food Insecurity (02/14/2023)   Hunger Vital Sign    Worried About Running Out of Food in the Last Year: Never true    Ran Out of Food in the Last Year: Never true  Transportation Needs: No Transportation Needs (02/14/2023)   PRAPARE - Administrator, Civil Service (Medical): No    Lack of Transportation (Non-Medical): No  Physical Activity: Insufficiently  Active (09/16/2022)   Exercise Vital Sign    Days of Exercise per Week: 2 days    Minutes of Exercise per Session: 20 min  Stress: No Stress Concern Present (09/16/2022)   [employer] of Occupational Health - Occupational Stress Questionnaire    Feeling of Stress : Only a little  Social Connections: Socially Isolated (09/16/2022)   Social Connection and Isolation Panel    Frequency of Communication with Friends and Family: Twice a week    Frequency of Social Gatherings with Friends and Family: Once a week    Attends Religious Services: Never    Database administrator or Organizations: No    Attends Banker Meetings: Never    Marital Status: Never married  Intimate Partner Violence: Not At Risk (09/16/2022)   Humiliation, Afraid, Rape, and Kick questionnaire    Fear of Current or Ex-Partner: No     Emotionally Abused: No    Physically Abused: No    Sexually Abused: No    Outpatient Medications Prior to Visit  Medication Sig Dispense Refill   aspirin EC 81 MG tablet Take 81 mg by mouth daily.     busPIRone  (BUSPAR ) 5 MG tablet Take 1 tablet (5 mg total) by mouth 2 (two) times daily. 60 tablet 5   Continuous Glucose Receiver (FREESTYLE LIBRE 3 READER) DEVI Use it to check blood glucose as instructed. 1 each 0   Continuous Glucose Sensor (FREESTYLE LIBRE 3 SENSOR) MISC Use it to check blood glucose as instructed. 2 each 5   FARXIGA  10 MG TABS tablet TAKE 1 TABLET DAILY BEFORE BREAKFAST. 90 tablet 3   gabapentin  (NEURONTIN ) 400 MG capsule Take 1 capsule (400 mg total) by mouth 3 (three) times daily. 90 capsule 5   Glucose Blood (BLOOD GLUCOSE TEST STRIPS) STRP Use to monitor FSBS 3x daily. Dx: E11.65. (Patient taking differently: 1 each by Other route daily. E11.65.) 100 each 3   ibuprofen  (ADVIL ) 600 MG tablet Take 1 tablet (600 mg total) by mouth every 8 (eight) hours as needed. 30 tablet 0   lidocaine  (LIDODERM ) 5 % Place 1 patch onto the skin daily. Remove & Discard patch within 12 hours or as directed by MD 30 patch 0   Misc. Devices MISC Lift chair 1 each 0   nitroGLYCERIN  (NITROSTAT ) 0.4 MG SL tablet DISSOLVE 1 TABLET UNDER TONGUE EVERY 5 MINUTES UP TO 15 MIN FOR CHEST PAIN. IF NO RELIEF CALL 911. 25 tablet 0   propranolol  (INDERAL ) 20 MG tablet Take 1 tablet (20 mg total) by mouth 2 (two) times daily. 60 tablet 3   rosuvastatin  (CRESTOR ) 10 MG tablet Take 1 tablet (10 mg total) by mouth daily. 90 tablet 1   triamcinolone  cream (KENALOG ) 0.1 % Apply 1 Application topically 2 (two) times daily. 30 g 0   amLODipine  (NORVASC ) 5 MG tablet Take 1 tablet (5 mg total) by mouth daily. 90 tablet 3   Capsaicin  0.1 % CREA Apply 1 Application topically daily. 60 g 2   Dulaglutide  (TRULICITY ) 4.5 MG/0.5ML SOPN Inject 4.5 mg as directed once a week. 6 mL 3   DULoxetine  (CYMBALTA ) 60 MG capsule  Take 1 capsule (60 mg total) by mouth daily. 90 capsule 1   No facility-administered medications prior to visit.    Allergies  Allergen Reactions   Lisinopril  Cough    ROS Review of Systems  Constitutional:  Negative for chills and fever.  HENT:  Negative for congestion and sore throat.  Eyes:  Negative for pain and discharge.  Respiratory:  Negative for cough and shortness of breath.        Chest wall pain  Cardiovascular:  Negative for palpitations.  Gastrointestinal:  Negative for constipation, diarrhea, nausea and vomiting.  Endocrine: Negative for polydipsia and polyuria.  Genitourinary:  Negative for dysuria and hematuria.  Musculoskeletal:  Positive for arthralgias, back pain and gait problem. Negative for neck pain and neck stiffness.  Skin:  Negative for rash.  Neurological:  Positive for numbness (b/l feet, 5th digits - b/l hands). Negative for dizziness, weakness and headaches.  Psychiatric/Behavioral:  Negative for agitation and behavioral problems. The patient is nervous/anxious.       Objective:    Physical Exam Vitals reviewed.  Constitutional:      General: He is not in acute distress.    Appearance: He is not diaphoretic.     Comments: Has a cane for walking support  HENT:     Head: Normocephalic and atraumatic.     Nose: Nose normal.     Mouth/Throat:     Mouth: Mucous membranes are moist.  Eyes:     General: No scleral icterus.    Extraocular Movements: Extraocular movements intact.  Cardiovascular:     Rate and Rhythm: Normal rate and regular rhythm.     Heart sounds: Normal heart sounds. No murmur heard. Pulmonary:     Breath sounds: Normal breath sounds. No wheezing or rales.  Abdominal:     Palpations: Abdomen is soft.     Tenderness: There is no abdominal tenderness.  Musculoskeletal:     Cervical back: Neck supple. No tenderness.     Right lower leg: No edema.     Left lower leg: No edema.  Feet:     Right foot:     Toenail  Condition: Right toenails are abnormally thick. Fungal disease present.    Left foot:     Toenail Condition: Left toenails are abnormally thick.  Skin:    General: Skin is warm.     Findings: No rash.  Neurological:     General: No focal deficit present.     Mental Status: He is alert and oriented to person, place, and time.     Motor: Weakness (b/l LE, 4/5) present.     Gait: Gait abnormal.     Comments: Rapid and slurred speech, appears to be at baseline  Psychiatric:        Mood and Affect: Mood normal.        Behavior: Behavior normal.     BP 110/69   Pulse 67   Ht 5' 8 (1.727 m)   Wt 236 lb (107 kg)   SpO2 94%   BMI 35.88 kg/m  Wt Readings from Last 3 Encounters:  05/02/24 236 lb (107 kg)  01/01/24 243 lb (110.2 kg)  08/31/23 234 lb 6.4 oz (106.3 kg)    Lab Results  Component Value Date   TSH 0.906 04/24/2023   Lab Results  Component Value Date   WBC 9.0 04/24/2023   HGB 16.2 04/24/2023   HCT 45.7 04/24/2023   MCV 93 04/24/2023   PLT 185 04/24/2023   Lab Results  Component Value Date   NA 141 01/01/2024   K 5.0 01/01/2024   CO2 26 01/01/2024   GLUCOSE 112 (H) 01/01/2024   BUN 8 01/01/2024   CREATININE 0.92 01/01/2024   BILITOT 0.5 01/01/2024   ALKPHOS 82 01/01/2024   AST 17 01/01/2024  ALT 16 01/01/2024   PROT 7.0 01/01/2024   ALBUMIN 4.4 01/01/2024   CALCIUM  9.5 01/01/2024   ANIONGAP 5 02/11/2023   EGFR 92 01/01/2024   GFR 94.60 08/27/2021   Lab Results  Component Value Date   CHOL 112 04/24/2023   Lab Results  Component Value Date   HDL 43 04/24/2023   Lab Results  Component Value Date   LDLCALC 52 04/24/2023   Lab Results  Component Value Date   TRIG 90 04/24/2023   Lab Results  Component Value Date   CHOLHDL 2.6 04/24/2023   Lab Results  Component Value Date   HGBA1C 6.6 (H) 01/01/2024      Assessment & Plan:   Problem List Items Addressed This Visit       Cardiovascular and Mediastinum   Essential hypertension    BP Readings from Last 1 Encounters:  05/02/24 110/69   Well-controlled with Amlodipine  Counseled for compliance with the medications Advised DASH diet and moderate exercise/walking, at least 150 mins/week      Relevant Medications   amLODipine  (NORVASC ) 5 MG tablet   Other Relevant Orders   TSH   CMP14+EGFR   CBC with Differential/Platelet     Endocrine   Diabetes mellitus with neurological manifestation (HCC)   Lab Results  Component Value Date   HGBA1C 6.6 (H) 01/01/2024   Well-controlled overall On Trulicity  and Farxiga  Did not tolerate metformin , had GI discomfort  Advised to follow diabetic diet On statin Diabetic eye exam: Advised to follow up with Ophthalmology for diabetic eye exam  On gabapentin  and Cymbalta  for diabetic neuropathy      Relevant Medications   Dulaglutide  (TRULICITY ) 4.5 MG/0.5ML SOAJ   Other Relevant Orders   Hemoglobin A1c   CMP14+EGFR   Urine Microalbumin w/creat. ratio   Neuropathy, diabetic (HCC)   On Cymbalta  60 mg QD, needs to be compliant Needs to continue to take gabapentin  as well, takes only qHS Used to take Norco PRN, advised patient to avoid taking Norco Had given Capsaicin  cream as well  Difficulty getting up from chair/bed, likely due to peripheral neuropathy in addition to hip OA and DDD of lumbar spine  Will submit PCS form      Relevant Medications   DULoxetine  (CYMBALTA ) 60 MG capsule   Dulaglutide  (TRULICITY ) 4.5 MG/0.5ML SOAJ     Other   Hyperlipidemia   On Crestor  Check lipid profile      Relevant Medications   amLODipine  (NORVASC ) 5 MG tablet   Other Relevant Orders   Lipid panel   Anxiety   Uncontrolled recently due to his brother's arrest Currently on Cymbalta  for GAD and neuropathy, needs  to be compliant On BuSpar  5 mg BID now      Relevant Medications   DULoxetine  (CYMBALTA ) 60 MG capsule   Physical deconditioning   Difficulty getting up from chair/bed, likely due to peripheral neuropathy  in addition to hip OA and DDD of lumbar spine Had prescribed chair lift for assistance and to decrease fall risk  Needs home health aide - PCS forms will be submitted      Encounter for general adult medical examination with abnormal findings - Primary   Physical exam as documented. Counseling done  re healthy lifestyle involving commitment to 150 minutes exercise per week, heart healthy diet, and attaining healthy weight.The importance of adequate sleep also discussed. Immunization and cancer screening needs are specifically addressed at this visit.      Prostate cancer screening   Ordered  PSA after discussing its limitations for prostate cancer screening, including false positive results leading to additional investigations.      Relevant Orders   PSA   Other Visit Diagnoses       Vitamin D  deficiency       Relevant Orders   VITAMIN D  25 Hydroxy (Vit-D Deficiency, Fractures)       Meds ordered this encounter  Medications   DULoxetine  (CYMBALTA ) 60 MG capsule    Sig: Take 1 capsule (60 mg total) by mouth daily.    Dispense:  90 capsule    Refill:  1   Dulaglutide  (TRULICITY ) 4.5 MG/0.5ML SOAJ    Sig: Inject 4.5 mg as directed once a week.    Dispense:  6 mL    Refill:  3   amLODipine  (NORVASC ) 5 MG tablet    Sig: Take 1 tablet (5 mg total) by mouth daily.    Dispense:  90 tablet    Refill:  3    Follow-up: Return in about 4 months (around 09/02/2024) for HTN and DM.    Suzzane MARLA Blanch, MD

## 2024-05-02 NOTE — Assessment & Plan Note (Signed)
 Difficulty getting up from chair/bed, likely due to peripheral neuropathy in addition to hip OA and DDD of lumbar spine Had prescribed chair lift for assistance and to decrease fall risk  Needs home health aide - PCS forms will be submitted

## 2024-05-02 NOTE — Assessment & Plan Note (Signed)
 Lab Results  Component Value Date   HGBA1C 6.6 (H) 01/01/2024   Well-controlled overall On Trulicity  and Farxiga  Did not tolerate metformin , had GI discomfort  Advised to follow diabetic diet On statin Diabetic eye exam: Advised to follow up with Ophthalmology for diabetic eye exam  On gabapentin  and Cymbalta  for diabetic neuropathy

## 2024-05-02 NOTE — Patient Instructions (Addendum)
Please continue to take medications as prescribed.  Please continue to follow low carb diet and perform moderate exercise/walking as tolerated.  Please consider getting Tdap vaccine at local pharmacy.

## 2024-05-02 NOTE — Assessment & Plan Note (Signed)
 Ordered PSA after discussing its limitations for prostate cancer screening, including false positive results leading to additional investigations.

## 2024-05-02 NOTE — Assessment & Plan Note (Signed)
 BP Readings from Last 1 Encounters:  05/02/24 110/69   Well-controlled with Amlodipine  Counseled for compliance with the medications Advised DASH diet and moderate exercise/walking, at least 150 mins/week

## 2024-05-02 NOTE — Assessment & Plan Note (Signed)
 On Cymbalta  60 mg QD, needs to be compliant Needs to continue to take gabapentin  as well, takes only qHS Used to take Norco PRN, advised patient to avoid taking Norco Had given Capsaicin  cream as well  Difficulty getting up from chair/bed, likely due to peripheral neuropathy in addition to hip OA and DDD of lumbar spine  Will submit PCS form

## 2024-05-02 NOTE — Assessment & Plan Note (Signed)
On Crestor Check lipid profile 

## 2024-05-03 ENCOUNTER — Ambulatory Visit: Payer: Self-pay | Admitting: Internal Medicine

## 2024-05-03 LAB — HEMOGLOBIN A1C
Est. average glucose Bld gHb Est-mCnc: 137 mg/dL
Hgb A1c MFr Bld: 6.4 % — ABNORMAL HIGH (ref 4.8–5.6)

## 2024-05-03 LAB — CBC WITH DIFFERENTIAL/PLATELET
Basophils Absolute: 0.1 x10E3/uL (ref 0.0–0.2)
Basos: 1 %
EOS (ABSOLUTE): 0.2 x10E3/uL (ref 0.0–0.4)
Eos: 3 %
Hematocrit: 51.3 % — ABNORMAL HIGH (ref 37.5–51.0)
Hemoglobin: 17.4 g/dL (ref 13.0–17.7)
Immature Grans (Abs): 0 x10E3/uL (ref 0.0–0.1)
Immature Granulocytes: 0 %
Lymphocytes Absolute: 1.9 x10E3/uL (ref 0.7–3.1)
Lymphs: 23 %
MCH: 32.6 pg (ref 26.6–33.0)
MCHC: 33.9 g/dL (ref 31.5–35.7)
MCV: 96 fL (ref 79–97)
Monocytes Absolute: 0.8 x10E3/uL (ref 0.1–0.9)
Monocytes: 10 %
Neutrophils Absolute: 5.5 x10E3/uL (ref 1.4–7.0)
Neutrophils: 63 %
Platelets: 184 x10E3/uL (ref 150–450)
RBC: 5.33 x10E6/uL (ref 4.14–5.80)
RDW: 11.8 % (ref 11.6–15.4)
WBC: 8.6 x10E3/uL (ref 3.4–10.8)

## 2024-05-03 LAB — CMP14+EGFR
ALT: 13 IU/L (ref 0–44)
AST: 18 IU/L (ref 0–40)
Albumin: 4.3 g/dL (ref 3.9–4.9)
Alkaline Phosphatase: 81 IU/L (ref 44–121)
BUN/Creatinine Ratio: 16 (ref 10–24)
BUN: 13 mg/dL (ref 8–27)
Bilirubin Total: 0.7 mg/dL (ref 0.0–1.2)
CO2: 21 mmol/L (ref 20–29)
Calcium: 9.2 mg/dL (ref 8.6–10.2)
Chloride: 103 mmol/L (ref 96–106)
Creatinine, Ser: 0.79 mg/dL (ref 0.76–1.27)
Globulin, Total: 2.9 g/dL (ref 1.5–4.5)
Glucose: 92 mg/dL (ref 70–99)
Potassium: 4.4 mmol/L (ref 3.5–5.2)
Sodium: 141 mmol/L (ref 134–144)
Total Protein: 7.2 g/dL (ref 6.0–8.5)
eGFR: 98 mL/min/1.73 (ref >59)

## 2024-05-03 LAB — LIPID PANEL
Chol/HDL Ratio: 2.7 ratio (ref 0.0–5.0)
Cholesterol, Total: 106 mg/dL (ref 100–199)
HDL: 39 mg/dL — ABNORMAL LOW (ref >39)
LDL Chol Calc (NIH): 50 mg/dL (ref 0–99)
Triglycerides: 87 mg/dL (ref 0–149)
VLDL Cholesterol Cal: 17 mg/dL (ref 5–40)

## 2024-05-03 LAB — VITAMIN D 25 HYDROXY (VIT D DEFICIENCY, FRACTURES): Vit D, 25-Hydroxy: 21.2 ng/mL — ABNORMAL LOW (ref 30.0–100.0)

## 2024-05-03 LAB — PSA: Prostate Specific Ag, Serum: 0.3 ng/mL (ref 0.0–4.0)

## 2024-05-03 LAB — TSH: TSH: 0.611 u[IU]/mL (ref 0.450–4.500)

## 2024-05-20 ENCOUNTER — Telehealth: Payer: Self-pay | Admitting: Internal Medicine

## 2024-05-20 NOTE — Telephone Encounter (Unsigned)
 Copied from CRM [id number]. Topic: Clinical - Medication Refill >> May 20, 2024  9:17 AM Gustabo D wrote: Medication: propranolol  (INDERAL ) 20 MG tablet  Has the patient contacted their pharmacy? No (Agent: If no, request that the patient contact the pharmacy for the refill. If patient does not wish to contact the pharmacy document the reason why and proceed with request.) (Agent: If yes, when and what did the pharmacy advise?)  This is the patient's preferred pharmacy:  [facility] - [place], [place] - [address] [place] [address] Phone: [phone] Fax: [phone]  Is this the correct pharmacy for this prescription? Yes If no, delete pharmacy and type the correct one.   Has the prescription been filled recently? No  Is the patient out of the medication? Yes  Has the patient been seen for an appointment in the last year OR does the patient have an upcoming appointment? Yes  Can we respond through MyChart? Yes  Agent: Please be advised that Rx refills may take up to 3 business days. We ask that you follow-up with your pharmacy.

## 2024-05-21 ENCOUNTER — Other Ambulatory Visit: Payer: Self-pay | Admitting: Internal Medicine

## 2024-05-21 DIAGNOSIS — G250 Essential tremor: Secondary | ICD-10-CM

## 2024-05-21 MED ORDER — PROPRANOLOL HCL 20 MG PO TABS: 20.0000 mg | ORAL_TABLET | Freq: Two times a day (BID) | ORAL | 5 refills | Status: AC

## 2024-06-27 DIAGNOSIS — M79675 Pain in left toe(s): Secondary | ICD-10-CM | POA: Diagnosis not present

## 2024-06-27 DIAGNOSIS — B351 Tinea unguium: Secondary | ICD-10-CM | POA: Diagnosis not present

## 2024-06-27 DIAGNOSIS — M79674 Pain in right toe(s): Secondary | ICD-10-CM | POA: Diagnosis not present

## 2024-06-27 DIAGNOSIS — E1142 Type 2 diabetes mellitus with diabetic polyneuropathy: Secondary | ICD-10-CM | POA: Diagnosis not present

## 2024-06-27 DIAGNOSIS — L84 Corns and callosities: Secondary | ICD-10-CM | POA: Diagnosis not present

## 2024-07-15 ENCOUNTER — Other Ambulatory Visit: Payer: Self-pay | Admitting: Internal Medicine

## 2024-08-12 NOTE — Progress Notes (Signed)
 KEYANDRE PILEGGI                                          MRN: [id number]   08/12/2024   The [facility] Quality Team Specialist reviewed this patient medical record for the purposes of chart review for care gap closure. The following were reviewed: chart review for care gap closure-kidney health evaluation for diabetes:eGFR  and uACR.    [facility] Quality Team

## 2024-08-19 ENCOUNTER — Other Ambulatory Visit: Payer: Self-pay | Admitting: Internal Medicine

## 2024-08-19 DIAGNOSIS — E782 Mixed hyperlipidemia: Secondary | ICD-10-CM

## 2024-08-19 MED ORDER — ROSUVASTATIN CALCIUM 10 MG PO TABS: 10.0000 mg | ORAL_TABLET | Freq: Every day | ORAL | 1 refills | Status: AC

## 2024-08-19 NOTE — Telephone Encounter (Signed)
 Copied from CRM [id number]. Topic: Clinical - Medication Refill >> Aug 19, 2024  2:26 PM Amber H wrote: Medication: rosuvastatin  (CRESTOR ) 10 MG tablet [[id number]]  Has the patient contacted their pharmacy? Yes, they told him no refills left.  (Agent: If no, request that the patient contact the pharmacy for the refill. If patient does not wish to contact the pharmacy document the reason why and proceed with request.) (Agent: If yes, when and what did the pharmacy advise?)  This is the patient's preferred pharmacy:  [facility] - [place], [place] - [address] [place] [address] Phone: [phone] Fax: [phone]  Is this the correct pharmacy for this prescription? Yes If no, delete pharmacy and type the correct one.   Has the prescription been filled recently? Yes  Is the patient out of the medication? Yes  Has the patient been seen for an appointment in the last year OR does the patient have an upcoming appointment? Yes  Can we respond through MyChart? Yes  Agent: Please be advised that Rx refills may take up to 3 business days. We ask that you follow-up with your pharmacy.

## 2024-09-03 ENCOUNTER — Encounter: Payer: Self-pay | Admitting: Internal Medicine

## 2024-09-03 ENCOUNTER — Ambulatory Visit: Admitting: Internal Medicine

## 2024-09-03 VITALS — BP 116/68 | HR 72 | Ht 68.0 in | Wt 241.8 lb

## 2024-09-03 DIAGNOSIS — E782 Mixed hyperlipidemia: Secondary | ICD-10-CM

## 2024-09-03 DIAGNOSIS — F419 Anxiety disorder, unspecified: Secondary | ICD-10-CM

## 2024-09-03 DIAGNOSIS — G8929 Other chronic pain: Secondary | ICD-10-CM | POA: Insufficient documentation

## 2024-09-03 DIAGNOSIS — Z23 Encounter for immunization: Secondary | ICD-10-CM | POA: Diagnosis not present

## 2024-09-03 DIAGNOSIS — G250 Essential tremor: Secondary | ICD-10-CM

## 2024-09-03 DIAGNOSIS — E1140 Type 2 diabetes mellitus with diabetic neuropathy, unspecified: Secondary | ICD-10-CM | POA: Diagnosis not present

## 2024-09-03 DIAGNOSIS — M25571 Pain in right ankle and joints of right foot: Secondary | ICD-10-CM

## 2024-09-03 DIAGNOSIS — Z794 Long term (current) use of insulin: Secondary | ICD-10-CM

## 2024-09-03 DIAGNOSIS — I10 Essential (primary) hypertension: Secondary | ICD-10-CM

## 2024-09-03 DIAGNOSIS — M7989 Other specified soft tissue disorders: Secondary | ICD-10-CM | POA: Insufficient documentation

## 2024-09-03 DIAGNOSIS — B353 Tinea pedis: Secondary | ICD-10-CM | POA: Insufficient documentation

## 2024-09-03 MED ORDER — GABAPENTIN 400 MG PO CAPS: 400.0000 mg | ORAL_CAPSULE | Freq: Three times a day (TID) | ORAL | 5 refills | Status: AC

## 2024-09-03 MED ORDER — CLOTRIMAZOLE-BETAMETHASONE 1-0.05 % EX CREA
1.0000 | TOPICAL_CREAM | Freq: Every day | CUTANEOUS | 0 refills | Status: AC
Start: 2024-09-03 — End: ?

## 2024-09-03 MED ORDER — FUROSEMIDE 20 MG PO TABS: 20.0000 mg | ORAL_TABLET | Freq: Every day | ORAL | 0 refills | Status: DC | PRN

## 2024-09-03 MED ORDER — BUSPIRONE HCL 5 MG PO TABS
5.0000 mg | ORAL_TABLET | Freq: Two times a day (BID) | ORAL | 11 refills | Status: AC
Start: 2024-09-03 — End: ?

## 2024-09-03 NOTE — Progress Notes (Addendum)
 Established Patient Office Visit  Subjective:  Patient ID: Kevin [name], male    DOB: August 23, 1956  Age: 68 y.o. MRN: [id number]  CC:  Chief Complaint  Patient presents with   Follow-up    Would like flu shot today, states medication for his shaking is not helping.    HPI Kevin [name] is a 68 y.o. male with past medical history of DM with neuropathy, DDD of cervical spine and tobacco abuse who presents for f/u of his chronic medical conditions.  Type II DM with neuropathy: He takes Farxiga  and Trulicity  for DM.  His HbA1c was 6.4 in 07/25. He takes gabapentin  and duloxetine  for diabetic neuropathy. He still c/o neuropathic pain in feet and used to take Norco PRN for severe pain, but has noticed improvement recently with Gabapentin . He was given Gabapentin  600 mg by Podiatrist, but had drowsiness with it. He prefers to continue 400 mg TID dose.  HTN: BP is well-controlled. Takes medications regularly. Patient denies headache, dizziness, chest pain, dyspnea or palpitations.  He reports worsening of tremors in both hands recently, but has been taking propranolol  only QD instead of BID.  He had difficulty writing due to tremors.  Denies any shaking movements of the legs.  His tremors are mostly upon movement.  GAD: Recently better since his brother is out of the prison now.  He currently takes buspirone , but is noncompliant.  He agrees to take buspirone  5 mg twice daily.  Leg swelling: He reports bilateral leg swelling, which is chronic and recurrent.  He also reports chronic right ankle pain, which is worse with movement.  Denies any recent injury.  Past Medical History:  Diagnosis Date   Arthritis    Chronic knee pain    Depression    Diabetes mellitus (HCC)    Hyperlipidemia    Hypertension    Leg edema    Obesity     Past Surgical History:  Procedure Laterality Date   none      Family History  Problem Relation Age of Onset   Heart disease Unknown    Arthritis  Unknown    Diabetes Unknown    Cancer Mother        deceased    Social History   Socioeconomic History   Marital status: Single    Spouse name: Not on file   Number of children: 0   Years of education: Not on file   Highest education level: Not on file  Occupational History   Not on file  Tobacco Use   Smoking status: Every Day    Current packs/day: 0.20    Types: Cigarettes   Smokeless tobacco: Never  Vaping Use   Vaping status: Never Used  Substance and Sexual Activity   Alcohol use: No   Drug use: No   Sexual activity: Not Currently  Other Topics Concern   Not on file  Social History Narrative   Patient's POA is his sister, Dickey Sharps [phone].           Social Drivers of Corporate Investment Banker Strain: Low Risk  (09/16/2022)   Overall Financial Resource Strain (CARDIA)    Difficulty of Paying Living Expenses: Not hard at all  Food Insecurity: No Food Insecurity (02/14/2023)   Hunger Vital Sign    Worried About Running Out of Food in the Last Year: Never true    Ran Out of Food in the Last Year: Never true  Transportation Needs: No Transportation Needs (  02/14/2023)   PRAPARE - Administrator, Civil Service (Medical): No    Lack of Transportation (Non-Medical): No  Physical Activity: Insufficiently Active (09/16/2022)   Exercise Vital Sign    Days of Exercise per Week: 2 days    Minutes of Exercise per Session: 20 min  Stress: No Stress Concern Present (09/16/2022)   [employer] of Occupational Health - Occupational Stress Questionnaire    Feeling of Stress : Only a little  Social Connections: Socially Isolated (09/16/2022)   Social Connection and Isolation Panel    Frequency of Communication with Friends and Family: Twice a week    Frequency of Social Gatherings with Friends and Family: Once a week    Attends Religious Services: Never    Database Administrator or Organizations: No    Attends Banker Meetings: Never    Marital  Status: Never married  Intimate Partner Violence: Not At Risk (09/16/2022)   Humiliation, Afraid, Rape, and Kick questionnaire    Fear of Current or Ex-Partner: No    Emotionally Abused: No    Physically Abused: No    Sexually Abused: No    Outpatient Medications Prior to Visit  Medication Sig Dispense Refill   amLODipine  (NORVASC ) 5 MG tablet Take 1 tablet (5 mg total) by mouth daily. 90 tablet 3   aspirin EC 81 MG tablet Take 81 mg by mouth daily.     Continuous Glucose Receiver (FREESTYLE LIBRE 3 READER) DEVI Use it to check blood glucose as instructed. 1 each 0   Continuous Glucose Sensor (FREESTYLE LIBRE 3 SENSOR) MISC Use it to check blood glucose as instructed. 2 each 5   Dulaglutide  (TRULICITY ) 4.5 MG/0.5ML SOAJ Inject 4.5 mg as directed once a week. 6 mL 3   DULoxetine  (CYMBALTA ) 60 MG capsule Take 1 capsule (60 mg total) by mouth daily. 90 capsule 1   FARXIGA  10 MG TABS tablet TAKE 1 TABLET DAILY BEFORE BREAKFAST. 90 tablet 3   Glucose Blood (BLOOD GLUCOSE TEST STRIPS) STRP Use to monitor FSBS 3x daily. Dx: E11.65. (Patient taking differently: 1 each by Other route daily. E11.65.) 100 each 3   lidocaine  (LIDODERM ) 5 % Place 1 patch onto the skin daily. Remove & Discard patch within 12 hours or as directed by MD 30 patch 0   Misc. Devices MISC Lift chair 1 each 0   nitroGLYCERIN  (NITROSTAT ) 0.4 MG SL tablet DISSOLVE 1 TABLET UNDER TONGUE EVERY 5 MINUTES UP TO 15 MIN FOR CHEST PAIN. IF NO RELIEF CALL 911. 25 tablet 0   propranolol  (INDERAL ) 20 MG tablet Take 1 tablet (20 mg total) by mouth 2 (two) times daily. 60 tablet 5   rosuvastatin  (CRESTOR ) 10 MG tablet Take 1 tablet (10 mg total) by mouth daily. 90 tablet 1   triamcinolone  cream (KENALOG ) 0.1 % Apply 1 Application topically 2 (two) times daily. 30 g 0   busPIRone  (BUSPAR ) 5 MG tablet Take 1 tablet (5 mg total) by mouth 2 (two) times daily. 60 tablet 5   gabapentin  (NEURONTIN ) 400 MG capsule Take 1 capsule (400 mg total) by  mouth 3 (three) times daily. 90 capsule 5   ibuprofen  (ADVIL ) 600 MG tablet Take 1 tablet (600 mg total) by mouth every 8 (eight) hours as needed. 30 tablet 0   No facility-administered medications prior to visit.    Allergies  Allergen Reactions   Lisinopril  Cough    ROS Review of Systems  Constitutional:  Negative for chills and  fever.  HENT:  Negative for congestion and sore throat.   Eyes:  Negative for pain and discharge.  Respiratory:  Negative for cough and shortness of breath.        Chest wall pain  Cardiovascular:  Negative for palpitations.  Gastrointestinal:  Negative for constipation, diarrhea, nausea and vomiting.  Endocrine: Negative for polydipsia and polyuria.  Genitourinary:  Negative for dysuria and hematuria.  Musculoskeletal:  Positive for arthralgias, back pain and gait problem. Negative for neck pain and neck stiffness.  Skin:  Negative for rash.  Neurological:  Positive for tremors and numbness (b/l feet, 5th digits - b/l hands). Negative for dizziness, weakness and headaches.  Psychiatric/Behavioral:  Negative for agitation and behavioral problems. The patient is nervous/anxious.       Objective:    Physical Exam Vitals reviewed.  Constitutional:      General: He is not in acute distress.    Appearance: He is not diaphoretic.     Comments: Has a cane for walking support  HENT:     Head: Normocephalic and atraumatic.     Nose: Nose normal.     Mouth/Throat:     Mouth: Mucous membranes are moist.  Eyes:     General: No scleral icterus.    Extraocular Movements: Extraocular movements intact.  Cardiovascular:     Rate and Rhythm: Normal rate and regular rhythm.     Heart sounds: Normal heart sounds. No murmur heard. Pulmonary:     Breath sounds: Normal breath sounds. No wheezing or rales.  Musculoskeletal:     Cervical back: Neck supple. No tenderness.     Right lower leg: Edema (1+) present.     Left lower leg: Edema (1+) present.     Right  ankle: Swelling present. Tenderness present over the medial malleolus.  Feet:     Right foot:     Toenail Condition: Right toenails are abnormally thick. Fungal disease present.    Left foot:     Toenail Condition: Left toenails are abnormally thick.     Comments: Erythema and whitish patches in the webspaces of the feet bilaterally Skin:    General: Skin is warm.     Findings: No rash.  Neurological:     General: No focal deficit present.     Mental Status: He is alert and oriented to person, place, and time.     Motor: Weakness (b/l LE, 4/5) present.     Gait: Gait abnormal.     Comments: Rapid and slurred speech, appears to be at baseline  Psychiatric:        Mood and Affect: Mood normal.        Behavior: Behavior normal.     BP 116/68   Pulse 72   Ht 5' 8 (1.727 m)   Wt 241 lb 12.8 oz (109.7 kg)   SpO2 99%   BMI 36.77 kg/m  Wt Readings from Last 3 Encounters:  09/03/24 241 lb 12.8 oz (109.7 kg)  05/02/24 236 lb (107 kg)  01/01/24 243 lb (110.2 kg)    Lab Results  Component Value Date   TSH 0.611 05/02/2024   Lab Results  Component Value Date   WBC 8.6 05/02/2024   HGB 17.4 05/02/2024   HCT 51.3 (H) 05/02/2024   MCV 96 05/02/2024   PLT 184 05/02/2024   Lab Results  Component Value Date   NA 141 05/02/2024   K 4.4 05/02/2024   CO2 21 05/02/2024   GLUCOSE 92 05/02/2024  BUN 13 05/02/2024   CREATININE 0.79 05/02/2024   BILITOT 0.7 05/02/2024   ALKPHOS 81 05/02/2024   AST 18 05/02/2024   ALT 13 05/02/2024   PROT 7.2 05/02/2024   ALBUMIN 4.3 05/02/2024   CALCIUM  9.2 05/02/2024   ANIONGAP 5 02/11/2023   EGFR 98 05/02/2024   GFR 94.60 08/27/2021   Lab Results  Component Value Date   CHOL 106 05/02/2024   Lab Results  Component Value Date   HDL 39 (L) 05/02/2024   Lab Results  Component Value Date   LDLCALC 50 05/02/2024   Lab Results  Component Value Date   TRIG 87 05/02/2024   Lab Results  Component Value Date   CHOLHDL 2.7 05/02/2024    Lab Results  Component Value Date   HGBA1C 6.4 (H) 05/02/2024      Assessment & Plan:   Problem List Items Addressed This Visit       Cardiovascular and Mediastinum   Essential hypertension   BP Readings from Last 1 Encounters:  09/03/24 116/68   Well-controlled with Amlodipine  Counseled for compliance with the medications Advised DASH diet and moderate exercise/walking, at least 150 mins/week      Relevant Medications   furosemide  (LASIX ) 20 MG tablet     Endocrine   Diabetes mellitus with neurological manifestation (HCC) - Primary   Lab Results  Component Value Date   HGBA1C 6.4 (H) 05/02/2024   Well-controlled overall On Trulicity  and Farxiga  Did not tolerate metformin , had GI discomfort  Advised to follow diabetic diet On statin Diabetic eye exam: Advised to follow up with Ophthalmology for diabetic eye exam  On gabapentin  and Cymbalta  for diabetic neuropathy      Relevant Medications   gabapentin  (NEURONTIN ) 400 MG capsule   Other Relevant Orders   Microalbumin / creatinine urine ratio   Bayer DCA Hb A1c Waived     Nervous and Auditory   Essential tremor   Uncontrolled with propranolol  20 mg QD, needs to take it BID Continue gabapentin  for diabetic neuropathy He has denied neurology referral in the past        Musculoskeletal and Integument   Tinea pedis of both feet   Has tinea infection of the webspaces Prescribed Lotrisone  cream       Relevant Medications   clotrimazole -betamethasone  (LOTRISONE ) cream     Other   Hyperlipidemia   On Crestor  Checked lipid profile      Relevant Medications   furosemide  (LASIX ) 20 MG tablet   Anxiety   Better controlled recently Currently on Cymbalta  for GAD and neuropathy, needs to be compliant On BuSpar  5 mg BID now      Relevant Medications   busPIRone  (BUSPAR ) 5 MG tablet   Chronic pain of right ankle   Could be related to ankle arthritis versus ligament sprain Check x-ray of right  ankle Tylenol  arthritis as needed for pain for now      Relevant Medications   gabapentin  (NEURONTIN ) 400 MG capsule   Other Relevant Orders   DG Ankle Complete Right   Leg swelling   Likely due to chronic venous insufficiency Advised to perform leg elevation and use compression socks Lasix  20 mg QD x 1 week, and then only as needed for persistent leg swelling      Relevant Medications   furosemide  (LASIX ) 20 MG tablet   Other Visit Diagnoses       Encounter for immunization       Relevant Orders   Flu vaccine  HIGH DOSE PF(Fluzone Trivalent) (Completed)       Meds ordered this encounter  Medications   busPIRone  (BUSPAR ) 5 MG tablet    Sig: Take 1 tablet (5 mg total) by mouth 2 (two) times daily.    Dispense:  60 tablet    Refill:  11   gabapentin  (NEURONTIN ) 400 MG capsule    Sig: Take 1 capsule (400 mg total) by mouth 3 (three) times daily.    Dispense:  90 capsule    Refill:  5   clotrimazole -betamethasone  (LOTRISONE ) cream    Sig: Apply 1 Application topically daily.    Dispense:  30 g    Refill:  0   furosemide  (LASIX ) 20 MG tablet    Sig: Take 1 tablet (20 mg total) by mouth daily as needed (leg swelling).    Dispense:  30 tablet    Refill:  0    Please deliver the medicines to the patient.    Follow-up: Return in about 4 months (around 01/01/2025) for DM and HTN.    Suzzane MARLA Blanch, MD

## 2024-09-03 NOTE — Patient Instructions (Signed)
 Please start taking Propranolol  twice daily instead of once daily.  Please apply Lotrisone  around toes in the webspace.  Please take Furosemide  once daily for 1 week and then as needed for leg swelling.  Please get X-ray of right ankle done at [facility].

## 2024-09-03 NOTE — Assessment & Plan Note (Signed)
 Has tinea infection of the webspaces Prescribed Lotrisone  cream

## 2024-09-03 NOTE — Assessment & Plan Note (Signed)
 Could be related to ankle arthritis versus ligament sprain Check x-ray of right ankle Tylenol  arthritis as needed for pain for now

## 2024-09-03 NOTE — Assessment & Plan Note (Addendum)
On Crestor Checked lipid profile 

## 2024-09-03 NOTE — Assessment & Plan Note (Signed)
 BP Readings from Last 1 Encounters:  09/03/24 116/68   Well-controlled with Amlodipine  Counseled for compliance with the medications Advised DASH diet and moderate exercise/walking, at least 150 mins/week

## 2024-09-03 NOTE — Assessment & Plan Note (Signed)
 Lab Results  Component Value Date   HGBA1C 6.4 (H) 05/02/2024   Well-controlled overall On Trulicity  and Farxiga  Did not tolerate metformin , had GI discomfort  Advised to follow diabetic diet On statin Diabetic eye exam: Advised to follow up with Ophthalmology for diabetic eye exam  On gabapentin  and Cymbalta  for diabetic neuropathy

## 2024-09-03 NOTE — Assessment & Plan Note (Addendum)
 Better controlled recently Currently on Cymbalta  for GAD and neuropathy, needs to be compliant On BuSpar  5 mg BID now

## 2024-09-03 NOTE — Assessment & Plan Note (Signed)
 Likely due to chronic venous insufficiency Advised to perform leg elevation and use compression socks Lasix  20 mg QD x 1 week, and then only as needed for persistent leg swelling

## 2024-09-03 NOTE — Assessment & Plan Note (Addendum)
 Uncontrolled with propranolol  20 mg QD, needs to take it BID Continue gabapentin  for diabetic neuropathy He has denied neurology referral in the past

## 2024-09-05 ENCOUNTER — Ambulatory Visit: Payer: Self-pay | Admitting: Internal Medicine

## 2024-09-05 LAB — MICROALBUMIN / CREATININE URINE RATIO
Creatinine, Urine: 67.4 mg/dL
Microalb/Creat Ratio: 13 mg/g{creat} (ref 0–29)
Microalbumin, Urine: 9 ug/mL

## 2024-09-05 LAB — BAYER DCA HB A1C WAIVED: HB A1C (BAYER DCA - WAIVED): 6 % — ABNORMAL HIGH (ref 4.8–5.6)

## 2024-09-23 ENCOUNTER — Other Ambulatory Visit: Payer: Self-pay | Admitting: Internal Medicine

## 2024-09-23 DIAGNOSIS — M7989 Other specified soft tissue disorders: Secondary | ICD-10-CM

## 2024-09-27 ENCOUNTER — Telehealth: Payer: Self-pay

## 2024-09-27 NOTE — Telephone Encounter (Unsigned)
 Copied from CRM #[id number]. Topic: Clinical - Prescription Issue >> Sep 27, 2024  9:48 AM Kevin [name] wrote: Reason for CRM: [facility] is calling to let pcp know patient has ran out of medication because he is taking 2 a day but directions say 1 tablet daily. Patient told them he was advised to increase his dosage. Please confirm with pharmacy if this is accurate.  PH: [phone].   amLODipine  (NORVASC ) 5 MG tablet

## 2024-09-30 NOTE — Telephone Encounter (Signed)
 Spoke to [employer] at [facility] and advised her that patient is only suppose to take 1 A day, insurance will pay for meds on 12/27 or patient can pay cah price of $17.91. Spoke to patient brother advised him of what hoy said but also told him to make sure patient only takes one a day if he is having issues with bp he is needing to contact the office

## 2024-10-01 ENCOUNTER — Other Ambulatory Visit: Payer: Self-pay

## 2024-10-01 DIAGNOSIS — I10 Essential (primary) hypertension: Secondary | ICD-10-CM

## 2024-10-01 MED ORDER — AMLODIPINE BESYLATE 5 MG PO TABS: 5.0000 mg | ORAL_TABLET | Freq: Two times a day (BID) | ORAL | 1 refills | Status: AC

## 2024-10-15 ENCOUNTER — Ambulatory Visit

## 2024-10-16 ENCOUNTER — Other Ambulatory Visit: Payer: Self-pay | Admitting: Internal Medicine

## 2024-10-16 DIAGNOSIS — M7989 Other specified soft tissue disorders: Secondary | ICD-10-CM

## 2024-10-16 NOTE — Telephone Encounter (Signed)
 Copied from CRM [id number]. Topic: Clinical - Medication Refill >> Oct 16, 2024  2:29 PM Carlyon D wrote: Medication: furosemide  (LASIX ) 20 MG tablet   Has the patient contacted their pharmacy? No (Agent: If no, request that the patient contact the pharmacy for the refill. If patient does not wish to contact the pharmacy document the reason why and proceed with request.) (Agent: If yes, when and what did the pharmacy advise?)  This is the patient's preferred pharmacy:  [facility] - [place], [place] - [address] [place] [address] Phone: [phone] Fax: [phone]  Is this the correct pharmacy for this prescription? Yes If no, delete pharmacy and type the correct one.   Has the prescription been filled recently? Yes  Is the patient out of the medication? Yes  Has the patient been seen for an appointment in the last year OR does the patient have an upcoming appointment? Yes  Can we respond through MyChart? Yes  Agent: Please be advised that Rx refills may take up to 3 business days. We ask that you follow-up with your pharmacy.

## 2024-10-17 MED ORDER — FUROSEMIDE 20 MG PO TABS: 20.0000 mg | ORAL_TABLET | Freq: Every day | ORAL | 0 refills | Status: AC | PRN

## 2024-11-29 ENCOUNTER — Ambulatory Visit

## 2025-01-01 ENCOUNTER — Ambulatory Visit: Admitting: Internal Medicine
